# Patient Record
Sex: Female | Born: 1965 | Race: Black or African American | Hispanic: No | Marital: Married | Smoking: Never smoker
Health system: Southern US, Community
[De-identification: ages and names within clinical notes are randomized; demographics above are authoritative.]

## PROBLEM LIST (undated history)

## (undated) DIAGNOSIS — J45909 Unspecified asthma, uncomplicated: Secondary | ICD-10-CM

## (undated) DIAGNOSIS — N189 Chronic kidney disease, unspecified: Secondary | ICD-10-CM

## (undated) DIAGNOSIS — K760 Fatty (change of) liver, not elsewhere classified: Secondary | ICD-10-CM

## (undated) DIAGNOSIS — E119 Type 2 diabetes mellitus without complications: Secondary | ICD-10-CM

## (undated) DIAGNOSIS — M329 Systemic lupus erythematosus, unspecified: Secondary | ICD-10-CM

## (undated) DIAGNOSIS — G8929 Other chronic pain: Secondary | ICD-10-CM

## (undated) DIAGNOSIS — IMO0002 Reserved for concepts with insufficient information to code with codable children: Secondary | ICD-10-CM

## (undated) DIAGNOSIS — M549 Dorsalgia, unspecified: Secondary | ICD-10-CM

## (undated) DIAGNOSIS — G43909 Migraine, unspecified, not intractable, without status migrainosus: Secondary | ICD-10-CM

## (undated) DIAGNOSIS — M797 Fibromyalgia: Secondary | ICD-10-CM

## (undated) DIAGNOSIS — K589 Irritable bowel syndrome without diarrhea: Secondary | ICD-10-CM

## (undated) DIAGNOSIS — G5600 Carpal tunnel syndrome, unspecified upper limb: Secondary | ICD-10-CM

## (undated) DIAGNOSIS — N3010 Interstitial cystitis (chronic) without hematuria: Secondary | ICD-10-CM

## (undated) DIAGNOSIS — I10 Essential (primary) hypertension: Secondary | ICD-10-CM

## (undated) DIAGNOSIS — G932 Benign intracranial hypertension: Secondary | ICD-10-CM

## (undated) HISTORY — DX: Benign intracranial hypertension: G93.2

## (undated) HISTORY — PX: CYSTOSCOPY: SUR368

## (undated) HISTORY — DX: Carpal tunnel syndrome, unspecified upper limb: G56.00

## (undated) HISTORY — DX: Fatty (change of) liver, not elsewhere classified: K76.0

## (undated) HISTORY — PX: APPENDECTOMY: SHX54

## (undated) HISTORY — DX: Type 2 diabetes mellitus without complications: E11.9

## (undated) HISTORY — PX: CHOLECYSTECTOMY: SHX55

## (undated) HISTORY — DX: Systemic lupus erythematosus, unspecified: M32.9

## (undated) HISTORY — PX: ABDOMINAL HYSTERECTOMY: SHX81

## (undated) HISTORY — DX: Chronic kidney disease, unspecified: N18.9

---

## 2004-08-23 ENCOUNTER — Ambulatory Visit: Payer: Self-pay | Admitting: Pulmonary Disease

## 2006-08-14 ENCOUNTER — Ambulatory Visit (HOSPITAL_BASED_OUTPATIENT_CLINIC_OR_DEPARTMENT_OTHER): Admission: RE | Admit: 2006-08-14 | Discharge: 2006-08-14 | Payer: Self-pay | Admitting: General Surgery

## 2006-08-14 ENCOUNTER — Encounter (INDEPENDENT_AMBULATORY_CARE_PROVIDER_SITE_OTHER): Payer: Self-pay | Admitting: *Deleted

## 2006-09-02 ENCOUNTER — Encounter: Admission: RE | Admit: 2006-09-02 | Discharge: 2006-09-02 | Payer: Self-pay | Admitting: Rheumatology

## 2006-09-10 ENCOUNTER — Ambulatory Visit (HOSPITAL_COMMUNITY): Admission: RE | Admit: 2006-09-10 | Discharge: 2006-09-10 | Payer: Self-pay | Admitting: Specialist

## 2006-12-12 ENCOUNTER — Ambulatory Visit: Payer: Self-pay | Admitting: Internal Medicine

## 2006-12-22 ENCOUNTER — Ambulatory Visit: Payer: Self-pay | Admitting: Internal Medicine

## 2006-12-23 ENCOUNTER — Ambulatory Visit: Payer: Self-pay | Admitting: Internal Medicine

## 2007-07-03 ENCOUNTER — Ambulatory Visit: Payer: Self-pay | Admitting: Hematology and Oncology

## 2007-07-06 ENCOUNTER — Encounter: Admission: RE | Admit: 2007-07-06 | Discharge: 2007-07-06 | Payer: Self-pay | Admitting: Rheumatology

## 2007-07-10 LAB — SPEP & IFE WITH QIG
Albumin ELP: 44.6 % — ABNORMAL LOW (ref 55.8–66.1)
Alpha-2-Globulin: 12.6 % — ABNORMAL HIGH (ref 7.1–11.8)
Beta 2: 6.4 % (ref 3.2–6.5)
Beta Globulin: 6.3 % (ref 4.7–7.2)
IgA: 372 mg/dL (ref 68–378)

## 2007-07-10 LAB — COMPREHENSIVE METABOLIC PANEL
AST: 36 U/L (ref 0–37)
Albumin: 3.6 g/dL (ref 3.5–5.2)
BUN: 10 mg/dL (ref 6–23)
Calcium: 9 mg/dL (ref 8.4–10.5)
Chloride: 100 mEq/L (ref 96–112)
Glucose, Bld: 78 mg/dL (ref 70–99)
Potassium: 3.6 mEq/L (ref 3.5–5.3)

## 2007-07-10 LAB — KAPPA/LAMBDA LIGHT CHAINS: Kappa:Lambda Ratio: 1.07 (ref 0.26–1.65)

## 2007-07-14 LAB — UIFE/LIGHT CHAINS/TP QN, 24-HR UR
Free Kappa/Lambda Ratio: 6.55 ratio — ABNORMAL HIGH (ref 0.46–4.00)
Free Lambda Excretion/Day: 3 mg/d
Free Lambda Lt Chains,Ur: 0.2 mg/dL (ref 0.08–1.01)
Free Lt Chn Excr Rate: 19.65 mg/d
Time: 24 hours
Total Protein, Urine-Ur/day: 27 mg/d (ref 10–140)
Total Protein, Urine: 1.8 mg/dL
Volume, Urine: 1500 mL

## 2007-07-15 ENCOUNTER — Ambulatory Visit (HOSPITAL_COMMUNITY): Admission: RE | Admit: 2007-07-15 | Discharge: 2007-07-15 | Payer: Self-pay | Admitting: Hematology and Oncology

## 2007-07-29 LAB — CBC WITH DIFFERENTIAL/PLATELET
Basophils Absolute: 0 10*3/uL (ref 0.0–0.1)
EOS%: 1.7 % (ref 0.0–7.0)
Eosinophils Absolute: 0.2 10*3/uL (ref 0.0–0.5)
HGB: 11.8 g/dL (ref 11.6–15.9)
NEUT#: 5.8 10*3/uL (ref 1.5–6.5)
RDW: 15.4 % — ABNORMAL HIGH (ref 11.3–14.5)
lymph#: 2.5 10*3/uL (ref 0.9–3.3)

## 2007-09-13 ENCOUNTER — Encounter: Admission: RE | Admit: 2007-09-13 | Discharge: 2007-09-13 | Payer: Self-pay | Admitting: Rheumatology

## 2007-10-15 ENCOUNTER — Encounter: Admission: RE | Admit: 2007-10-15 | Discharge: 2007-10-15 | Payer: Self-pay | Admitting: Gastroenterology

## 2007-10-21 DIAGNOSIS — J309 Allergic rhinitis, unspecified: Secondary | ICD-10-CM | POA: Insufficient documentation

## 2007-10-21 DIAGNOSIS — I2510 Atherosclerotic heart disease of native coronary artery without angina pectoris: Secondary | ICD-10-CM | POA: Insufficient documentation

## 2007-10-22 ENCOUNTER — Ambulatory Visit: Payer: Self-pay | Admitting: Internal Medicine

## 2007-10-22 DIAGNOSIS — R0602 Shortness of breath: Secondary | ICD-10-CM | POA: Insufficient documentation

## 2007-10-27 ENCOUNTER — Ambulatory Visit: Payer: Self-pay | Admitting: Internal Medicine

## 2007-10-28 ENCOUNTER — Telehealth (INDEPENDENT_AMBULATORY_CARE_PROVIDER_SITE_OTHER): Payer: Self-pay | Admitting: *Deleted

## 2007-10-29 ENCOUNTER — Ambulatory Visit: Payer: Self-pay | Admitting: Pulmonary Disease

## 2007-11-09 ENCOUNTER — Encounter: Payer: Self-pay | Admitting: Pulmonary Disease

## 2007-11-09 ENCOUNTER — Ambulatory Visit: Payer: Self-pay

## 2007-11-11 ENCOUNTER — Ambulatory Visit (HOSPITAL_COMMUNITY): Admission: RE | Admit: 2007-11-11 | Discharge: 2007-11-11 | Payer: Self-pay | Admitting: Internal Medicine

## 2007-11-11 ENCOUNTER — Encounter: Payer: Self-pay | Admitting: Internal Medicine

## 2007-11-13 ENCOUNTER — Ambulatory Visit: Payer: Self-pay | Admitting: Internal Medicine

## 2007-11-13 ENCOUNTER — Encounter: Payer: Self-pay | Admitting: Internal Medicine

## 2007-11-16 ENCOUNTER — Encounter (INDEPENDENT_AMBULATORY_CARE_PROVIDER_SITE_OTHER): Payer: Self-pay | Admitting: *Deleted

## 2007-11-25 ENCOUNTER — Encounter: Payer: Self-pay | Admitting: Internal Medicine

## 2007-11-26 ENCOUNTER — Encounter: Payer: Self-pay | Admitting: Internal Medicine

## 2007-12-03 ENCOUNTER — Encounter (INDEPENDENT_AMBULATORY_CARE_PROVIDER_SITE_OTHER): Payer: Self-pay | Admitting: *Deleted

## 2007-12-09 ENCOUNTER — Encounter: Payer: Self-pay | Admitting: Internal Medicine

## 2007-12-09 ENCOUNTER — Ambulatory Visit (HOSPITAL_COMMUNITY): Admission: RE | Admit: 2007-12-09 | Discharge: 2007-12-09 | Payer: Self-pay | Admitting: Internal Medicine

## 2008-01-18 ENCOUNTER — Ambulatory Visit: Payer: Self-pay | Admitting: Internal Medicine

## 2008-02-05 ENCOUNTER — Ambulatory Visit: Payer: Self-pay | Admitting: Internal Medicine

## 2009-08-21 IMAGING — CT CT PARANASAL SINUSES LIMITED
2 of 6 series · 15 of 36 positions shown, 18 images · non-contrast
Comparison: CT abdomen pelvis 10/15/2007.
COMPARISON: None.

CLINICAL DATA: 41-year-old female with shortness of breath and
right posterior chest pain times 2 months.  Chronic headaches.
Evaluate pulmonary vasculitis or sinusitis.

CHEST CT WITHOUT CONTRAST
TECHNIQUE: Multidetector CT imaging of the chest was performed
following the standard protocol without intravenous contrast.
following the standard protocol without IV contrast.,Technique:
Multidetector CT through the paranasal sinuses was performed using
the standard protocol without intravenous contrast.

[Series 8: chest_hi_res 5.0 b40f st · axial · 0.74mm/px · z∈[+1184,+1404]mm · 12 of 52 slices shown, 15 images]
[im 4/52  mediastinal]
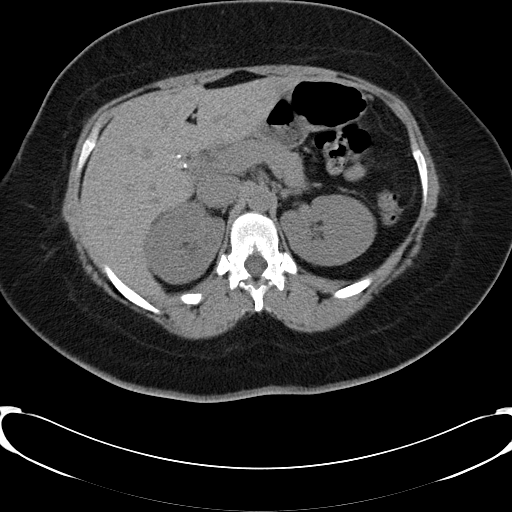
[im 4/52  bone]
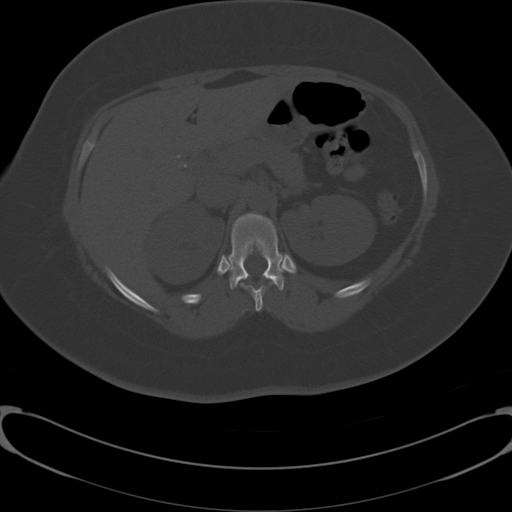
[im 8/52  bone]
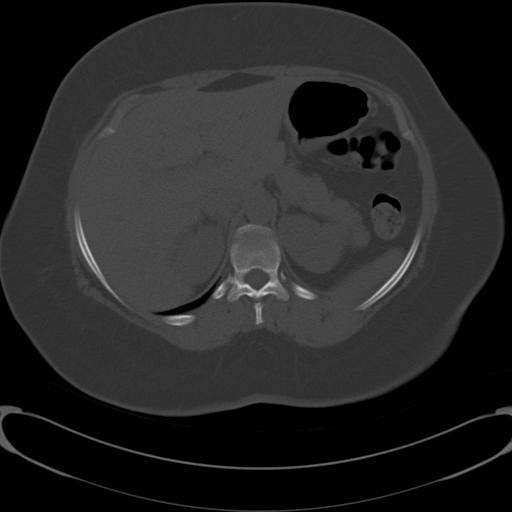
[im 12/52  bone]
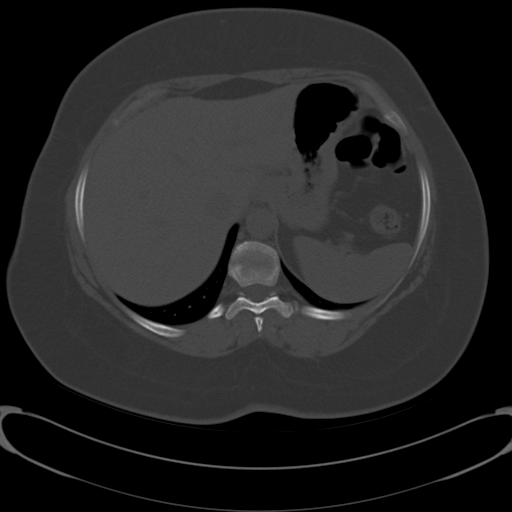
[im 16/52  bone]
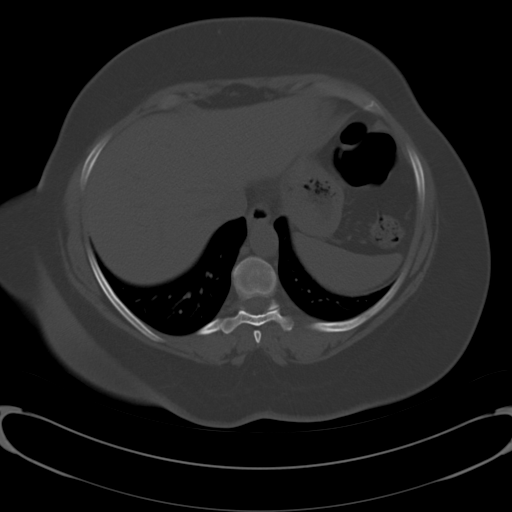
[im 20/52  mediastinal]
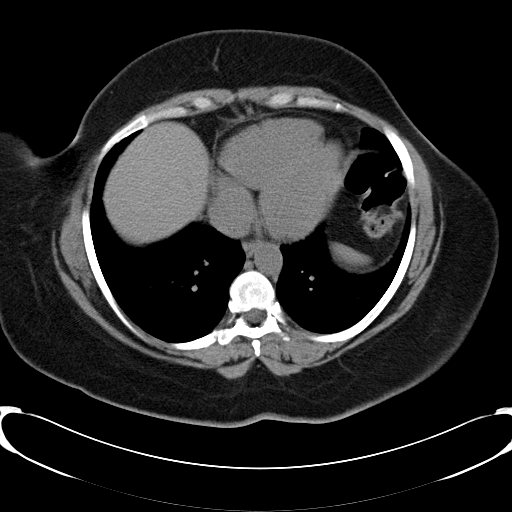
[im 20/52  bone]
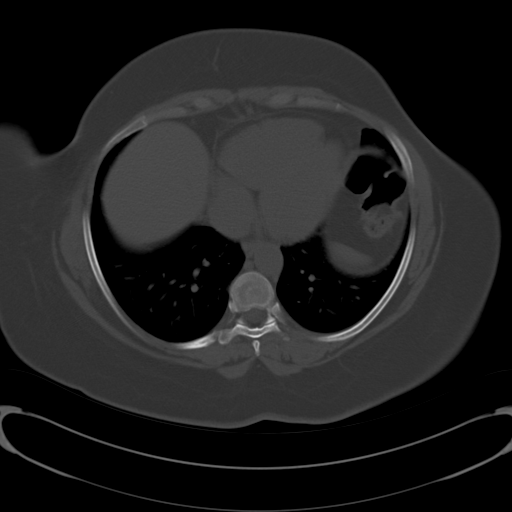
[im 24/52  bone]
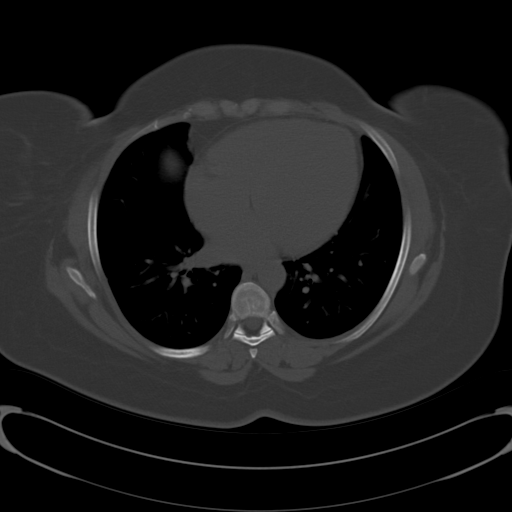
[im 28/52  bone]
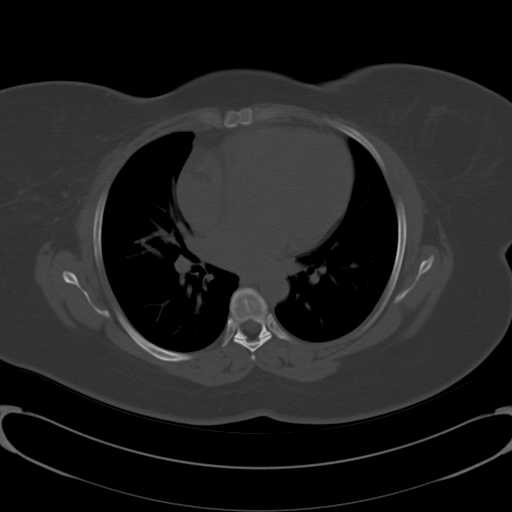
[im 32/52  bone]
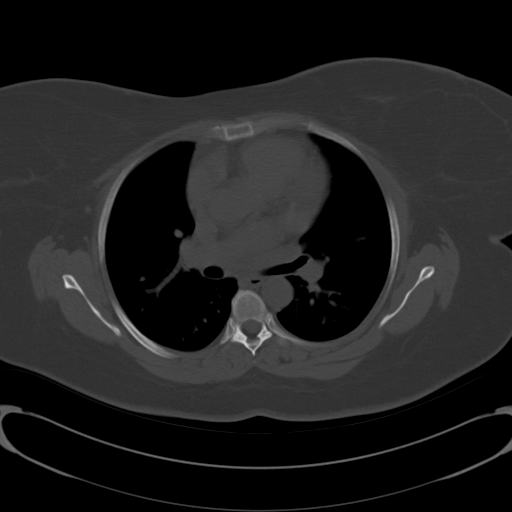
[im 36/52  mediastinal]
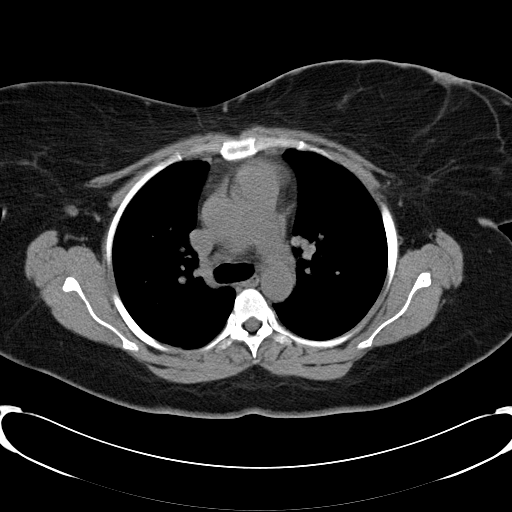
[im 36/52  bone]
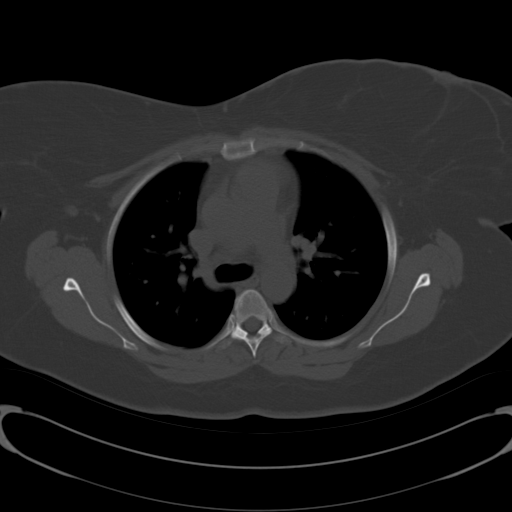
[im 40/52  bone]
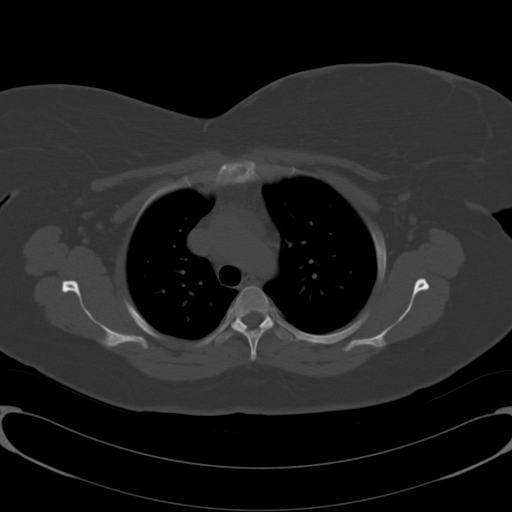
[im 44/52  bone]
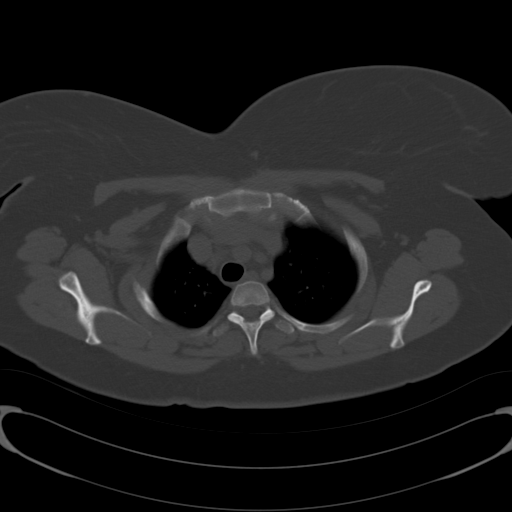
[im 48/52  bone]
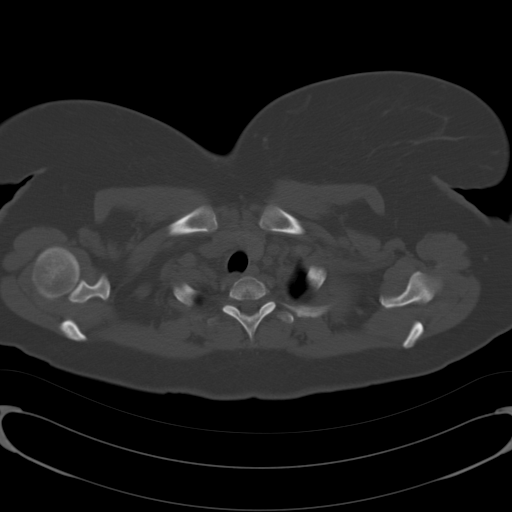

[Series 603: <mpr thick range> · coronal · 0.74mm/px · 3 of 84 slices shown]
[im 17/84  bone]
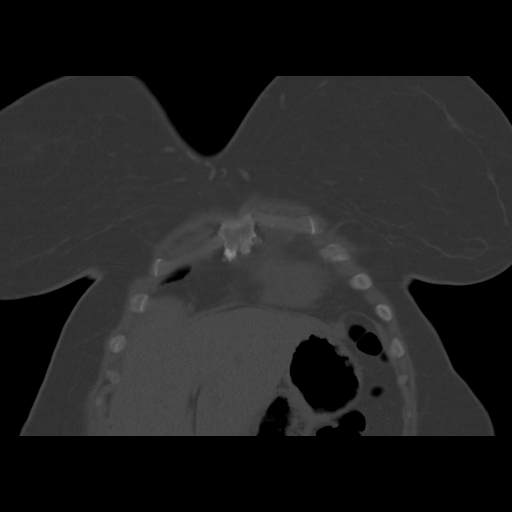
[im 34/84  bone]
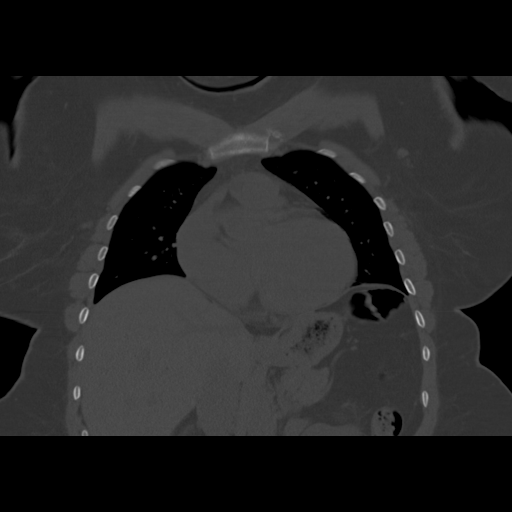
[im 50/84  bone]
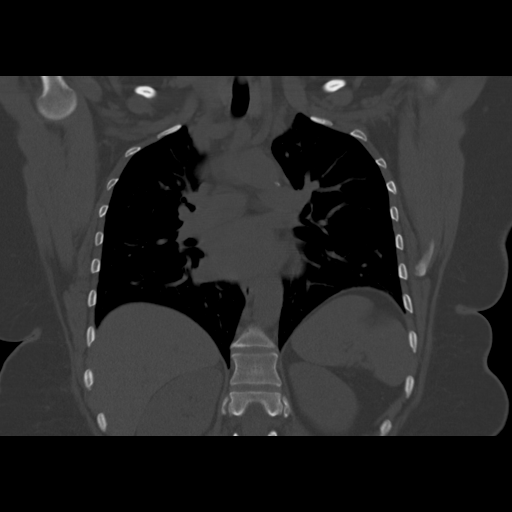

[15 of 36 positions shown; findings below may reference images not displayed]

FINDINGS: The major airways are patent.  There is no pleural
effusion.  The lungs are clear.  Dedicated high resolution images
with high spatial resolution algorithm reveal no septal thickening,
ground-glass opacity or pulmonary nodules.

No osseous abnormality.  Noncontrasted upper abdominal viscera
remarkable for surgical absence of the gallbladder.  No pericardial
effusion.  No mediastinal lymphadenopathy.  Thoracic inlet within
normal limits.  No axillary lymphadenopathy.  No focal inflammatory
changes identified.
IMPRESSION: Normal noncontrast chest CT.  Negative high resolution images of
the lungs.

CT PARANASAL SINUSES WITHOUT CONTRAST
FINDINGS: Visualized scalp, face and orbit soft tissues are within
normal limits.  The paranasal sinuses are clear aside from a small
area of opacification in the right anterior ethmoid air cells.  No
mucosal thickening.  No acute osseous abnormality.  Visualized
brain parenchyma is grossly normal.
IMPRESSION: No paranasal sinus disease.

## 2010-06-10 ENCOUNTER — Encounter: Payer: Self-pay | Admitting: Gastroenterology

## 2010-10-02 NOTE — Assessment & Plan Note (Signed)
[facility]                             PULMONARY OFFICE NOTE   PROMISS, LABARBERA                      MRN:          [id number]  DATE:12/23/2006                            DOB:          1965-09-22    PULMONARY EXTENDED FOLLOW-UP OFFICE VISIT:   HISTORY:  A 45 year old black female with paroxysms of dyspnea that last  from minutes to days and actually spent all last week very tired and  short of breath but spontaneously improved.  Previously, she said she  has improved after a course of prednisone but did not do anything  different during the week to treat the problem nor did she contact the  office for evaluation.  She comes back today as requested for PFTs,  having undergone a workup for occult collagen vascular disease and  having been found by Dr. Corliss Skains to have a positive ANA study done on  October 27, 2006.  Her anti-double-stranded DNA was negative, however.  Dr.  Corliss Skains also found that she had a positive P-ANCA and a sed rate of 48  documented also on July 31, 2006.   Today she says she is feeling much better.  She denies any exertional  chest pain, orthopnea, PND, leg swelling, or significant cough.   PHYSICAL EXAMINATION:  She is a pleasant, ambulatory black female in no  acute distress.  She is afebrile with normal vital signs.  HEENT:  Unremarkable.  Her oropharynx is clear.  LUNGS:  Perfectly clear bilaterally to auscultation and percussion with  no cough elicited on inspiratory/expiratory maneuvers.  HEART:  Regular rhythm without murmur, rub or gallop.  ABDOMEN:  Soft, benign.  EXTREMITIES:  Warm without calf tenderness, clubbing, cyanosis or edema.   PFTs were performed today and are normal.   Chest x-ray was reported to be normal in April and is repeated today and  is pending.   IMPRESSION:  No evidence of active pulmonary involvement with collagen  vascular disease.  It is very unlikely that she would have any form of  parenchymal or airway disease that would spontaneously remit and then  turn around and exacerbate on the same maintenance regimen without a  more specific pattern (for instance, associated with cough or subjective  wheezing or stabilization and/or improvement on Advair).   I also could not make any sense out of her medical regimen, which is  quite complicated and not user-friendly, in terms of her organization of  her medicines.   RECOMMENDATIONS:  1. If she is going to take Nexium at all, she should take it 40 mg 30-      60 minutes before eating perfectly regularly to see if  It has any effect on her spells, which very well could all be reflux.  1. At this point, she should stop Advair because she says it makes no      difference whether she takes the Advair or not in terms of having      these spells.  2. When she returns in two weeks, I would like for her to present all  of her medicines in two separate bags so we can get a full and      accurate inventory of her medicines (incidental note made today of      the fact that she does apparently take Macrobid, which she did not      disclose previously and could become very relevant if symptoms      remain unexplained).  3. 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      Probably a red herring.  If, on the other hand, she had a sed      rate elevation that correlated with symptoms, I would be more      convinced that there was an underlying collagen vascular disease      that needed to be treated but would be still confused by the fact      that she spontaneously remits, in terms of symptoms, without any      specific change in maintenance or p.r.n. medicines.   Before we go any further with her workup, I would recommend full  medication reconciliation and institute also a GERD diet for her  empirically, assuming that some if not all of her pulmonary symptoms may  be GERD-related.     Charlaine Dalton. Sherene Sires, MD,  [facility]  Electronically Signed    MBW/MedQ  DD: 12/23/2006  DT: 12/23/2006  Job #: [id number]   cc:   Magnus Sinning. Rice, M.D.

## 2010-10-02 NOTE — Assessment & Plan Note (Signed)
[facility]                             PULMONARY OFFICE NOTE   CODEE, BLOODWORTH                      MRN:          [id number]  DATE:12/12/2006                            DOB:          [birth year]    PULMONARY CONSULTATION   REASON FOR CONSULTATION:  Cough and dyspnea.   HISTORY:  This is a 45 year old black female never-smoker presenting  with symptoms of cough, fatigue, and fever 4 to 5 years ago with a  chronic pneumonia with no obvious explanation.  She also had bilateral  anterior chest discomfort, worse with deep breathing that radiated to  her back.  No specific diagnosis was ever made, but apparently has  recently been diagnosed with fibromyalgia, and then as lupus by Dr.  Corliss Skains.  Her most recent chest x-ray dated September 02, 2006 shows no  pulmonary disease, and actually since that x-ray was made, the patient  has no symptoms at all.  She is not aerobically active in that fatigue  and her knees hold her back more than her breathing.  However, she  thinks she has improved on her present regimen.   The problem is that her present regimen is listed alphabetically and  consists of 2 pages of medications.  When I asked her if she took all of  the medicines, she says Well, some of them I take when I need and some  of them I take regularly, but she has no organization at all to them.   However, whatever she is doing presently, she says, is working great  (whatever that is).   PAST MEDICAL HISTORY:  Significant for hypertension, diabetes, and  remote hysterectomy, tubal ligation.   ALLERGIES:  IMITREX, CODEINE, and ALTACE.   MEDICATIONS:  Taken alphabetically from the patient, but the patient  uses her own organization as to whether she takes maintenance versus  p.r.n.  I could not actually tell what she is taking, when she is taking  it, and why she is taking it.   SOCIAL HISTORY:  She has never smoked.  She works as an  Recruitment consultant.   FAMILY HISTORY:  Positive for asthma in her grandmother and rheumatism  in both mother and grandmother.   REVIEW OF SYSTEMS:  Taken in detail on the worksheet and negative,  except as outlined above.   PHYSICAL EXAMINATION:  This is a pleasant, ambulatory, obese black  female in no acute distress.  VITAL SIGNS:  Stable vital signs.  HEENT:  Unremarkable.  Oropharynx is clear.  LUNGS:  The lung fields are completely clear bilaterally to auscultation  and percussion with excellent air movement.  No cough on inspiratory or  expiratory maneuvers.  CARDIAC:  Regular rate and rhythm without murmur, gallop, or rub.  ABDOMEN:  Soft and benign.  EXTREMITIES:  Warm without calf tenderness, cyanosis, clubbing, or  edema.   CHEST X-RAY:  Reviewed from September 02, 2006.  It is completely normal.  Hemoglobin saturation is 99%.   IMPRESSION:  Because this patient has no active symptoms, but was having  active complaints when her chest  x-ray was seen on April 15, I do not  believe any further pulmonary workup is needed.  There is, her  unexplained chest pain, dyspnea, and cough have all resolved on her  present regimen, whatever it is.   Therefore, I am going to ask her to continue to take exactly the  medicines she is taking and simply asked her to come back for full  medication reconciliation purposes where she separates her medicines  into 2 bags, those she perceives she should take perfectly regularly,  regardless of whether she feels good or not, and the second category are  those medicines she stops when she feels better.   Perhaps then I can get a better idea of how to help her adjust her  maintenance medicines downward and use her p.r.n. in a more logical  manner.   I see no evidence based on chest x-ray or history of active lupus  pneumonitis, which I believe was the issue Dr. Corliss Skains was raising,  but if there are other specific pulmonary issues, I  would be happy to  address them as they arise.     Charlaine Dalton. Sherene Sires, MD, [facility]  Electronically Signed    MBW/MedQ  DD: 12/12/2006  DT: 12/13/2006  Job #: [id number]   cc:   Kathryne Hitch, MD

## 2010-10-05 NOTE — Op Note (Signed)
Connie Arnold, [place]               ACCOUNT NO.:  [id number]   MEDICAL RECORD NO.:  [id number]          PATIENT TYPE:  AMB   LOCATION:  [facility]                          FACILITY:  [facility]   PHYSICIAN:  Anselm Pancoast. Weatherly, M.D.DATE OF BIRTH:  05-Jun-1965   DATE OF PROCEDURE:  DATE OF DISCHARGE:                               OPERATIVE REPORT   PREOPERATIVE DIAGNOSIS:  Left sided headache, rule out temporal  arteritis.   POSTOPERATIVE DIAGNOSIS:  Left sided headache, rule out temporal  arteritis.   OPERATION:  Left temporal artery biopsy.   ANESTHESIA:  Local anesthesia.   SURGEON:  Anselm Pancoast. Zachery Dakins, M.D.   HISTORY:  Teala Daffron is a 45 year old female referred to me by Dr.  Neale Burly, headache clinic, for rule out temporal arteritis.  She has a  mildly elevated SED rate, has a history of fibromyalgia and other  symptoms and is on several medications.  I discussed with her about the  artery with a biopsy and not management of the headache and she  understands the planned procedure.  Preoperatively, she asked that we  use general anesthesia but we elected to give her some p.o. Valium to  relax her and did it with local anesthesia.   She was positioned on the OR table, the patient was identified.  I used  the Doppler to kind of be able to feel and hear the artery nicely and  then shaved a small amount of the temporal hair area on the left.  I  then prepped the area with Betadine solution and draped her in a sterile  manner.  A 1% plain Xylocaine was used to infiltrate over the proximal  portion and then the small incision was made.  The artery was  immediately under the area and you could see it pulsating and there was  a vein lying with it.  I then anesthetized the area a little more  distally so I could get a good 1-inch segment of the vessel and then  used a 4-0 Vicryl to tie off the proximal area of the Vicryl.  I used  the hemostat, divided it and got about a 1-inch segment and  then tied  the opposite end with a 4-0 Vicryl.  There was a little bleeding from  the vein that I sutured with a 4-0 Vicryl and then the subcutaneous  tissue was closed with one stitch of 4-0 Vicryl and then interrupted 4-0  nylon used for the skin.  The patient tolerated the procedure nicely,  and will be released after a short stay and we will see her back in the  office in approximately a week.  We will have the path report possibly  tomorrow, probably Monday and we will notify her.  The artery itself  looked normal but we will let the pathologist rule out the temporal  arteritis.           ______________________________  Anselm Pancoast. Zachery Dakins, M.D.     WJW/MEDQ  D:  08/14/2006  T:  08/14/2006  Job:  [id number]

## 2011-05-06 DIAGNOSIS — M329 Systemic lupus erythematosus, unspecified: Secondary | ICD-10-CM | POA: Insufficient documentation

## 2011-05-06 DIAGNOSIS — Z79899 Other long term (current) drug therapy: Secondary | ICD-10-CM | POA: Insufficient documentation

## 2011-05-06 DIAGNOSIS — M797 Fibromyalgia: Secondary | ICD-10-CM | POA: Insufficient documentation

## 2011-11-16 DIAGNOSIS — N3010 Interstitial cystitis (chronic) without hematuria: Secondary | ICD-10-CM | POA: Insufficient documentation

## 2011-11-16 DIAGNOSIS — N390 Urinary tract infection, site not specified: Secondary | ICD-10-CM | POA: Insufficient documentation

## 2013-04-29 DIAGNOSIS — R809 Proteinuria, unspecified: Secondary | ICD-10-CM | POA: Insufficient documentation

## 2013-04-29 DIAGNOSIS — R319 Hematuria, unspecified: Secondary | ICD-10-CM | POA: Insufficient documentation

## 2013-08-20 ENCOUNTER — Emergency Department (HOSPITAL_BASED_OUTPATIENT_CLINIC_OR_DEPARTMENT_OTHER)
Admission: EM | Admit: 2013-08-20 | Discharge: 2013-08-20 | Disposition: A | Discharge status: Home / Self Care | Payer: Medicare Other | Attending: Emergency Medicine | Admitting: Emergency Medicine

## 2013-08-20 ENCOUNTER — Encounter (HOSPITAL_BASED_OUTPATIENT_CLINIC_OR_DEPARTMENT_OTHER): Payer: Self-pay | Admitting: Emergency Medicine

## 2013-08-20 DIAGNOSIS — Z8719 Personal history of other diseases of the digestive system: Secondary | ICD-10-CM | POA: Insufficient documentation

## 2013-08-20 DIAGNOSIS — J45909 Unspecified asthma, uncomplicated: Secondary | ICD-10-CM | POA: Insufficient documentation

## 2013-08-20 DIAGNOSIS — Z8742 Personal history of other diseases of the female genital tract: Secondary | ICD-10-CM | POA: Insufficient documentation

## 2013-08-20 DIAGNOSIS — I10 Essential (primary) hypertension: Secondary | ICD-10-CM | POA: Insufficient documentation

## 2013-08-20 DIAGNOSIS — J40 Bronchitis, not specified as acute or chronic: Secondary | ICD-10-CM

## 2013-08-20 DIAGNOSIS — J209 Acute bronchitis, unspecified: Secondary | ICD-10-CM | POA: Insufficient documentation

## 2013-08-20 DIAGNOSIS — Z79899 Other long term (current) drug therapy: Secondary | ICD-10-CM | POA: Insufficient documentation

## 2013-08-20 DIAGNOSIS — M329 Systemic lupus erythematosus, unspecified: Secondary | ICD-10-CM | POA: Insufficient documentation

## 2013-08-20 DIAGNOSIS — G43909 Migraine, unspecified, not intractable, without status migrainosus: Secondary | ICD-10-CM | POA: Insufficient documentation

## 2013-08-20 HISTORY — DX: Irritable bowel syndrome, unspecified: K58.9

## 2013-08-20 HISTORY — DX: Reserved for concepts with insufficient information to code with codable children: IMO0002

## 2013-08-20 HISTORY — DX: Essential (primary) hypertension: I10

## 2013-08-20 HISTORY — DX: Unspecified asthma, uncomplicated: J45.909

## 2013-08-20 HISTORY — DX: Systemic lupus erythematosus, unspecified: M32.9

## 2013-08-20 HISTORY — DX: Fibromyalgia: M79.7

## 2013-08-20 HISTORY — DX: Interstitial cystitis (chronic) without hematuria: N30.10

## 2013-08-20 HISTORY — DX: Migraine, unspecified, not intractable, without status migrainosus: G43.909

## 2013-08-20 MED ORDER — PREDNISONE 10 MG PO TABS: ORAL_TABLET | ORAL | Status: DC

## 2013-08-20 MED ORDER — AZITHROMYCIN 250 MG PO TABS: 250.0000 mg | ORAL_TABLET | Freq: Every day | ORAL | Status: DC

## 2013-08-20 NOTE — Discharge Instructions (Signed)

## 2013-08-20 NOTE — ED Notes (Signed)
Scratch throat and URI symptoms since yesterday.

## 2013-08-20 NOTE — ED Provider Notes (Signed)
CSN: [id number]     Arrival date & time 08/20/13  1214 History   None    Chief Complaint  Patient presents with  . URI     (Consider location/radiation/quality/duration/timing/severity/associated sxs/prior Treatment) Patient is a 48 y.o. female presenting with URI. The history is provided by the patient. No language interpreter was used.  URI Presenting symptoms: congestion and cough   Severity:  Moderate Timing:  Constant Progression:  Worsening Chronicity:  New Relieved by:  Nothing Worsened by:  Nothing tried Ineffective treatments:  None tried Risk factors comment:  Lupus  Pt has asthma and lupus.  Pt coughing and congested. Pt using inhaler.   Past Medical History  Diagnosis Date  . Asthma   . Lupus   . Fibromyalgia   . Migraines   . Hypertension   . IBS (irritable bowel syndrome)   . Interstitial cystitis    Past Surgical History  Procedure Laterality Date  . Abdominal hysterectomy    . Cholecystectomy    . Appendectomy     No family history on file. History  Substance Use Topics  . Smoking status: Never Smoker   . Smokeless tobacco: Not on file  . Alcohol Use: No   OB History   Grav Para Term Preterm Abortions TAB SAB Ect Mult Living                 Review of Systems  HENT: Positive for congestion.   Respiratory: Positive for cough.   All other systems reviewed and are negative.      Allergies  Codeine; Imitrex; and Ramipril  Home Medications   Current Outpatient Rx  Name  Route  Sig  Dispense  Refill  . diltiazem (MATZIM LA) 180 MG 24 hr tablet   Oral   Take 180 mg by mouth daily.         . hydroxychloroquine (PLAQUENIL) 200 MG tablet   Oral   Take by mouth daily.         Marland Kitchen. METFORMIN HCL PO   Oral   Take by mouth.         . Montelukast Sodium (SINGULAIR PO)   Oral   Take by mouth.         . NABUMETONE PO   Oral   Take by mouth.         . Topiramate (TOPAMAX PO)   Oral   Take by mouth.          BP 126/79   Pulse 75  Temp(Src) 98 F (36.7 C) (Oral)  Resp 16  Ht 5\' 5"  (1.651 m)  Wt 210 lb (95.255 kg)  BMI 34.95 kg/m2  SpO2 97% Physical Exam  Nursing note and vitals reviewed. Constitutional: She appears well-developed and well-nourished.  HENT:  Head: Normocephalic and atraumatic.  Right Ear: External ear normal.  Left Ear: External ear normal.  Mouth/Throat: Oropharynx is clear and moist.  Eyes: Conjunctivae and EOM are normal. Pupils are equal, round, and reactive to light.  Neck: Normal range of motion.  Cardiovascular: Normal rate and normal heart sounds.   Pulmonary/Chest: Effort normal and breath sounds normal.  Abdominal: Soft.  Musculoskeletal: Normal range of motion.  Neurological: She is alert.  Skin: Skin is warm.  Psychiatric: She has a normal mood and affect.    ED Course  Procedures (including critical care time) Labs Review Labs Reviewed - No data to display Imaging Review No results found.   EKG Interpretation None  MDM   Final diagnoses:  Bronchitis    zithromax and prednisone.      Lonia Skinner [place], PA-C 08/20/13 1440  Lonia Skinner [place], [place] 08/20/13 1440

## 2013-08-23 NOTE — ED Provider Notes (Signed)
History/physical exam/procedure(s) were performed by non-physician practitioner and as supervising physician I was immediately available for consultation/collaboration. I have reviewed all notes and am in agreement with care and plan.   Hilario Quarry, MD 08/23/13 1739

## 2013-11-01 DIAGNOSIS — F332 Major depressive disorder, recurrent severe without psychotic features: Secondary | ICD-10-CM | POA: Insufficient documentation

## 2014-01-05 ENCOUNTER — Emergency Department (HOSPITAL_BASED_OUTPATIENT_CLINIC_OR_DEPARTMENT_OTHER)
Admission: EM | Admit: 2014-01-05 | Discharge: 2014-01-05 | Disposition: A | Discharge status: Home / Self Care | Payer: Medicare Other | Attending: Emergency Medicine | Admitting: Emergency Medicine

## 2014-01-05 ENCOUNTER — Encounter (HOSPITAL_BASED_OUTPATIENT_CLINIC_OR_DEPARTMENT_OTHER): Payer: Self-pay | Admitting: Emergency Medicine

## 2014-01-05 ENCOUNTER — Emergency Department (HOSPITAL_BASED_OUTPATIENT_CLINIC_OR_DEPARTMENT_OTHER): Payer: Medicare Other

## 2014-01-05 DIAGNOSIS — Z87448 Personal history of other diseases of urinary system: Secondary | ICD-10-CM | POA: Diagnosis not present

## 2014-01-05 DIAGNOSIS — J45909 Unspecified asthma, uncomplicated: Secondary | ICD-10-CM | POA: Diagnosis not present

## 2014-01-05 DIAGNOSIS — M329 Systemic lupus erythematosus, unspecified: Secondary | ICD-10-CM | POA: Insufficient documentation

## 2014-01-05 DIAGNOSIS — I10 Essential (primary) hypertension: Secondary | ICD-10-CM | POA: Insufficient documentation

## 2014-01-05 DIAGNOSIS — G43909 Migraine, unspecified, not intractable, without status migrainosus: Secondary | ICD-10-CM | POA: Diagnosis not present

## 2014-01-05 DIAGNOSIS — S0990XA Unspecified injury of head, initial encounter: Secondary | ICD-10-CM | POA: Insufficient documentation

## 2014-01-05 DIAGNOSIS — Z792 Long term (current) use of antibiotics: Secondary | ICD-10-CM | POA: Diagnosis not present

## 2014-01-05 DIAGNOSIS — Z8719 Personal history of other diseases of the digestive system: Secondary | ICD-10-CM | POA: Insufficient documentation

## 2014-01-05 DIAGNOSIS — R519 Headache, unspecified: Secondary | ICD-10-CM

## 2014-01-05 DIAGNOSIS — R51 Headache: Secondary | ICD-10-CM

## 2014-01-05 DIAGNOSIS — IMO0002 Reserved for concepts with insufficient information to code with codable children: Secondary | ICD-10-CM | POA: Diagnosis not present

## 2014-01-05 MED ORDER — TOPIRAMATE 200 MG PO TABS: 200.0000 mg | ORAL_TABLET | Freq: Two times a day (BID) | ORAL | Status: DC

## 2014-01-05 MED ORDER — HYDROCODONE-ACETAMINOPHEN 5-325 MG PO TABS: 1.0000 | ORAL_TABLET | Freq: Four times a day (QID) | ORAL | Status: DC | PRN

## 2014-01-05 MED ORDER — HYDROXYCHLOROQUINE SULFATE 200 MG PO TABS: 200.0000 mg | ORAL_TABLET | Freq: Two times a day (BID) | ORAL | Status: AC

## 2014-01-05 NOTE — ED Notes (Signed)
Family at bs, pt in NAD.
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
## 2014-01-05 NOTE — Discharge Instructions (Signed)
Return here as needed Headaches, Frequently Asked Questions MIGRAINE HEADACHES Q: What is migraine? What causes it? How can I treat it? A: Generally, migraine headaches begin as a dull ache. Then they develop into a constant, throbbing, and pulsating pain. You may experience pain at the temples. You may experience pain at the front or back of one or both sides of the head. The pain is usually accompanied by a combination of:  Nausea.  Vomiting.  Sensitivity to light and noise. Some people (about 15%) experience an aura (see below) before an attack. The cause of migraine is believed to be chemical reactions in the brain. Treatment for migraine may include over-the-counter or prescription medications. It may also include self-help techniques. These include relaxation training and biofeedback.  Q: What is an aura? A: About 15% of people with migraine get an "aura". This is a sign of neurological symptoms that occur before a migraine headache. You may see wavy or jagged lines, dots, or flashing lights. You might experience tunnel vision or blind spots in one or both eyes. The aura can include visual or auditory hallucinations (something imagined). It may include disruptions in smell (such as strange odors), taste or touch. Other symptoms include:  Numbness.  A "pins and needles" sensation.  Difficulty in recalling or speaking the correct word. These neurological events may last as long as 60 minutes. These symptoms will fade as the headache begins. Q: What is a trigger? A: Certain physical or environmental factors can lead to or "trigger" a migraine. These include:  Foods.  Hormonal changes.  Weather.  Stress. It is important to remember that triggers are different for everyone. To help prevent migraine attacks, you need to figure out which triggers affect you. Keep a headache diary. This is a good way to track triggers. The diary will help you talk to your healthcare professional about  your condition. Q: Does weather affect migraines? A: Bright sunshine, hot, humid conditions, and drastic changes in barometric pressure may lead to, or "trigger," a migraine attack in some people. But studies have shown that weather does not act as a trigger for everyone with migraines. Q: What is the link between migraine and hormones? A: Hormones start and regulate many of your body's functions. Hormones keep your body in balance within a constantly changing environment. The levels of hormones in your body are unbalanced at times. Examples are during menstruation, pregnancy, or menopause. That can lead to a migraine attack. In fact, about three quarters of all women with migraine report that their attacks are related to the menstrual cycle.  Q: Is there an increased risk of stroke for migraine sufferers? A: The likelihood of a migraine attack causing a stroke is very remote. That is not to say that migraine sufferers cannot have a stroke associated with their migraines. In persons under age 61, the most common associated factor for stroke is migraine headache. But over the course of a person's normal life span, the occurrence of migraine headache may actually be associated with a reduced risk of dying from cerebrovascular disease due to stroke.  Q: What are acute medications for migraine? A: Acute medications are used to treat the pain of the headache after it has started. Examples over-the-counter medications, NSAIDs, ergots, and triptans.  Q: What are the triptans? A: Triptans are the newest class of abortive medications. They are specifically targeted to treat migraine. Triptans are vasoconstrictors. They moderate some chemical reactions in the brain. The triptans work on receptors in  your brain. Triptans help to restore the balance of a neurotransmitter called serotonin. Fluctuations in levels of serotonin are thought to be a main cause of migraine.  Q: Are over-the-counter medications for migraine  effective? A: Over-the-counter, or "OTC," medications may be effective in relieving mild to moderate pain and associated symptoms of migraine. But you should see your caregiver before beginning any treatment regimen for migraine.  Q: What are preventive medications for migraine? A: Preventive medications for migraine are sometimes referred to as "prophylactic" treatments. They are used to reduce the frequency, severity, and length of migraine attacks. Examples of preventive medications include antiepileptic medications, antidepressants, beta-blockers, calcium channel blockers, and NSAIDs (nonsteroidal anti-inflammatory drugs). Q: Why are anticonvulsants used to treat migraine? A: During the past few years, there has been an increased interest in antiepileptic drugs for the prevention of migraine. They are sometimes referred to as "anticonvulsants". Both epilepsy and migraine may be caused by similar reactions in the brain.  Q: Why are antidepressants used to treat migraine? A: Antidepressants are typically used to treat people with depression. They may reduce migraine frequency by regulating chemical levels, such as serotonin, in the brain.  Q: What alternative therapies are used to treat migraine? A: The term "alternative therapies" is often used to describe treatments considered outside the scope of conventional Western medicine. Examples of alternative therapy include acupuncture, acupressure, and yoga. Another common alternative treatment is herbal therapy. Some herbs are believed to relieve headache pain. Always discuss alternative therapies with your caregiver before proceeding. Some herbal products contain arsenic and other toxins. TENSION HEADACHES Q: What is a tension-type headache? What causes it? How can I treat it? A: Tension-type headaches occur randomly. They are often the result of temporary stress, anxiety, fatigue, or anger. Symptoms include soreness in your temples, a tightening  band-like sensation around your head (a "vice-like" ache). Symptoms can also include a pulling feeling, pressure sensations, and contracting head and neck muscles. The headache begins in your forehead, temples, or the back of your head and neck. Treatment for tension-type headache may include over-the-counter or prescription medications. Treatment may also include self-help techniques such as relaxation training and biofeedback. CLUSTER HEADACHES Q: What is a cluster headache? What causes it? How can I treat it? A: Cluster headache gets its name because the attacks come in groups. The pain arrives with little, if any, warning. It is usually on one side of the head. A tearing or bloodshot eye and a runny nose on the same side of the headache may also accompany the pain. Cluster headaches are believed to be caused by chemical reactions in the brain. They have been described as the most severe and intense of any headache type. Treatment for cluster headache includes prescription medication and oxygen. SINUS HEADACHES Q: What is a sinus headache? What causes it? How can I treat it? A: When a cavity in the bones of the face and skull (a sinus) becomes inflamed, the inflammation will cause localized pain. This condition is usually the result of an allergic reaction, a tumor, or an infection. If your headache is caused by a sinus blockage, such as an infection, you will probably have a fever. An x-ray will confirm a sinus blockage. Your caregiver's treatment might include antibiotics for the infection, as well as antihistamines or decongestants.  REBOUND HEADACHES Q: What is a rebound headache? What causes it? How can I treat it? A: A pattern of taking acute headache medications too often can lead to a condition  known as "rebound headache." A pattern of taking too much headache medication includes taking it more than 2 days per week or in excessive amounts. That means more than the label or a caregiver advises.  With rebound headaches, your medications not only stop relieving pain, they actually begin to cause headaches. Doctors treat rebound headache by tapering the medication that is being overused. Sometimes your caregiver will gradually substitute a different type of treatment or medication. Stopping may be a challenge. Regularly overusing a medication increases the potential for serious side effects. Consult a caregiver if you regularly use headache medications more than 2 days per week or more than the label advises. ADDITIONAL QUESTIONS AND ANSWERS Q: What is biofeedback? A: Biofeedback is a self-help treatment. Biofeedback uses special equipment to monitor your body's involuntary physical responses. Biofeedback monitors:  Breathing.  Pulse.  Heart rate.  Temperature.  Muscle tension.  Brain activity. Biofeedback helps you refine and perfect your relaxation exercises. You learn to control the physical responses that are related to stress. Once the technique has been mastered, you do not need the equipment any more. Q: Are headaches hereditary? A: Four out of five (80%) of people that suffer report a family history of migraine. Scientists are not sure if this is genetic or a family predisposition. Despite the uncertainty, a child has a 50% chance of having migraine if one parent suffers. The child has a 75% chance if both parents suffer.  Q: Can children get headaches? A: By the time they reach high school, most young people have experienced some type of headache. Many safe and effective approaches or medications can prevent a headache from occurring or stop it after it has begun.  Q: What type of doctor should I see to diagnose and treat my headache? A: Start with your primary caregiver. Discuss his or her experience and approach to headaches. Discuss methods of classification, diagnosis, and treatment. Your caregiver may decide to recommend you to a headache specialist, depending upon your  symptoms or other physical conditions. Having diabetes, allergies, etc., may require a more comprehensive and inclusive approach to your headache. The National Headache Foundation will provide, upon request, a list of [facility] physician members in your state. Document Released: 07/27/2003 Document Revised: 07/29/2011 Document Reviewed: 01/04/2008 [facility] Patient Information 2015 [place], [place]. This information is not intended to replace advice given to you by your health care provider. Make sure you discuss any questions you have with your health care provider.
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
## 2014-01-06 NOTE — ED Provider Notes (Signed)
Medical screening examination/treatment/procedure(s) were performed by non-physician practitioner and as supervising physician I was immediately available for consultation/collaboration.   EKG Interpretation None        Toy CookeyMegan Tanor Glaspy, MD 01/06/14 1132

## 2014-06-01 ENCOUNTER — Encounter: Payer: Self-pay | Admitting: Neurology

## 2014-06-01 ENCOUNTER — Ambulatory Visit: Payer: Self-pay | Admitting: Neurology

## 2014-06-01 ENCOUNTER — Ambulatory Visit (INDEPENDENT_AMBULATORY_CARE_PROVIDER_SITE_OTHER): Payer: Medicare Other | Admitting: Neurology

## 2014-06-01 VITALS — BP 116/80 | HR 70 | Resp 14 | Ht 65.0 in | Wt 230.4 lb

## 2014-06-01 DIAGNOSIS — M542 Cervicalgia: Secondary | ICD-10-CM

## 2014-06-01 DIAGNOSIS — G43709 Chronic migraine without aura, not intractable, without status migrainosus: Secondary | ICD-10-CM

## 2014-06-01 MED ORDER — OXYCODONE HCL 5 MG PO TABS: 5.0000 mg | ORAL_TABLET | Freq: Four times a day (QID) | ORAL | Status: DC | PRN

## 2014-06-01 MED ORDER — OXYCODONE HCL 15 MG PO TABS: 15.0000 mg | ORAL_TABLET | Freq: Three times a day (TID) | ORAL | Status: DC | PRN

## 2014-06-01 MED ORDER — LEVETIRACETAM 750 MG PO TABS: 750.0000 mg | ORAL_TABLET | Freq: Three times a day (TID) | ORAL | Status: DC

## 2014-06-01 MED ORDER — ALPRAZOLAM 1 MG PO TABS: 1.0000 mg | ORAL_TABLET | Freq: Three times a day (TID) | ORAL | Status: DC | PRN

## 2014-06-01 NOTE — Progress Notes (Signed)
[facility]  PATIENT: Connie Arnold DOB: [birth year]  REFERRING CLINICIAN: Josiah Lobo  HISTORY FROM: patient REASON FOR VISIT: Chronic Migraines   HISTORICAL  CHIEF COMPLAINT:  Chief Complaint  Patient presents with  . Migraine    HISTORY OF PRESENT ILLNESS:  Connie Arnold is a 49 old woman with chronic migraine headaches for years. The headaches are located predominantly on the right and are present in the occiput as well as above the eye. She reports rare nausea but no vomiting. There is some photophobia but not much phonophobia. Moving the head can increase the headache. When the headache occurs, the pain will be delivered better if she lays down and takes a baclofen and oxycodone.  She notes some tenderness around the eyes, more on the right. There is no actual numbness or facial weakness. Her arms hurt and she sometimes has tingling in the hands. There is no arm weakness.    She denies vision changes  Currently her headache has been present for many days. She has received a lot of benefit from occipital nerve blocks in the past. She would like to have another shot today. She had been on Botox for migraine prophylaxis in the past with some benefit. However, with changes in insurance she was unable to continue. Her co-pay is very high.   She is currently on Topamax 200 mg by mouth twice a day. This has helped her more initially than it does now. She did not get as much benefit from sinus and migraine in the past. She has never been on Keppra. She has tried several muscle relaxants in the past without much sustained benefit. However, she takes baclofen when she has a headache she feels a little better. She is unable to tolerate the anti-inflammatories due to mild renal insufficiency.  REVIEW OF SYSTEMS:  Constitutional: No fevers, chills, sweats, or change in appetite Eyes: No visual changes, double vision, eye pain Ear, nose and throat: No hearing loss, ear  pain, nasal congestion, sore throat Cardiovascular: No chest pain, palpitations Respiratory:  No shortness of breath at rest or with exertion.   No wheezes GastrointestinaI: No nausea, vomiting.   She has IBS Genitourinary:  No dysuria, urinary retention or frequency.  No nocturia. Musculoskeletal:  No neck pain, back pain Integumentary: No rash, pruritus, skin lesions Neurological: as above Psychiatric: No depression at this time.  No anxiety Endocrine:  She has diabetes. No palpitations, diaphoresis, change in appetite, change in weigh or increased thirst Hematologic/Lymphatic:  No anemia, purpura, petechiae. Allergic/Immunologic: She has had some seasonal allergies and sinusitis in the past.   ALLERGIES: Allergies  Allergen Reactions  . Codeine   . Imitrex [Sumatriptan] Nausea And Vomiting  . Ramipril     HOME MEDICATIONS: Outpatient Prescriptions Prior to Visit  Medication Sig Dispense Refill  . diltiazem (MATZIM LA) 180 MG 24 hr tablet Take 180 mg by mouth daily.    Marland Kitchen HYDROcodone-acetaminophen (NORCO/VICODIN) 5-325 MG per tablet Take 1-2 tablets by mouth every 6 (six) hours as needed. 10 tablet 0  . hydroxychloroquine (PLAQUENIL) 200 MG tablet Take 400 mg by mouth daily.     . hydroxychloroquine (PLAQUENIL) 200 MG tablet Take 1 tablet (200 mg total) by mouth 2 (two) times daily. 30 tablet 0  . METFORMIN HCL PO Take by mouth.    . Montelukast Sodium (SINGULAIR PO) Take by mouth.    . NABUMETONE PO Take by mouth.    . predniSONE (DELTASONE) 10 MG tablet 6,5,4,3,2,1  taper 21 tablet 0  . Topiramate (TOPAMAX PO) Take 400 mg by mouth daily.     Marland Kitchen topiramate (TOPAMAX) 200 MG tablet Take 1 tablet (200 mg total) by mouth 2 (two) times daily. 30 tablet 0  . azithromycin (ZITHROMAX) 250 MG tablet Take 1 tablet (250 mg total) by mouth daily. Take first 2 tablets together, then 1 every day until finished. 6 tablet 0   No facility-administered medications prior to visit.    PAST MEDICAL  HISTORY: Past Medical History  Diagnosis Date  . Asthma   . Lupus   . Fibromyalgia   . Migraines   . Hypertension   . IBS (irritable bowel syndrome)   . Interstitial cystitis   . Diabetes mellitus without complication   . Lupus (systemic lupus erythematosus)   . Pseudotumor cerebri   . Chronic kidney disease     PAST SURGICAL HISTORY: Past Surgical History  Procedure Laterality Date  . Abdominal hysterectomy    . Cholecystectomy    . Appendectomy      FAMILY HISTORY: Family History  Problem Relation Age of Onset  . Graves' disease Mother   . Heart disease Father   . Prostate cancer Father   . Healthy Brother   . Healthy Maternal Aunt   . Healthy Maternal Uncle     SOCIAL HISTORY:  History   Social History  . Marital Status: Married    Spouse Name: N/A    Number of Children: N/A  . Years of Education: N/A   Occupational History  . Not on file.   Social History Main Topics  . Smoking status: Never Smoker   . Smokeless tobacco: Not on file  . Alcohol Use: Not on file  . Drug Use: Not on file  . Sexual Activity: Not on file   Other Topics Concern  . Not on file   Social History Narrative     PHYSICAL EXAM  Filed Vitals:   06/01/14 1008  BP: 116/80  Pulse: 70  Resp: 14  Height:  (1.651 m)  Weight: 230 lb 6.4 oz (104.509 kg)    Body mass index is 38.34 kg/(m^2).   General: The patient is an obese, well-developed woman in no acute distress  Eyes:  Funduscopic exam shows normal optic discs and retinal vessels.  Neck: The neck is supple, no carotid bruits are noted.  The neck is tender over the occiput > lower paraspinal muscles.  Good ROM  Respiratory: The respiratory examination is clear.  Cardiovascular: The cardiovascular examination reveals a regular rate and rhythm, no murmurs, gallops or rubs are noted.  Skin: Extremities are without significant edema.  Neurologic Exam  Mental status: The patient is alert and oriented x 3 at  the time of the examination. The patient has apparent normal recent and remote memory, with an apparently normal attention span and concentration ability.   Speech is normal.  Cranial nerves: Extraocular movements are full. Pupils are equal, round, and reactive to light and accomodation.    Facial symmetry is present. There is good facial sensation to soft touch bilaterally.Facial strength is normal.  Trapezius and sternocleidomastoid strength is normal. No dysarthria is noted.  The tongue is midline, and the patient has symmetric elevation of the soft palate. No obvious hearing deficits are noted.  Motor:  Muscle bulk and tone are normal. Strength is  5 / 5 in all 4 extremities.   Sensory: Sensory testing is intact to touch and vibratory sensation in all 4  extremities.  Coordination: Cerebellar testing reveals good finger-nose-finger and heel-to-shin bilaterally.  Gait and station: Station and gait are normal. Tandem gait is normal. Romberg is negative.   Reflexes: Deep tendon reflexes are symmetric and normal bilaterally. Plantar responses are normal.    DIAGNOSTIC DATA (LABS, IMAGING, TESTING) - I reviewed patient records, labs, notes, testing and imaging myself where available.  Lab Results  Component Value Date   WBC 8.9 07/29/2007   HGB 11.8 07/29/2007   HCT 35.3 07/29/2007   MCV 83.6 07/29/2007   PLT 347 07/29/2007      Component Value Date/Time   NA 137 07/08/2007 1100   K 3.6 07/08/2007 1100   CL 100 07/08/2007 1100   CO2 26 07/08/2007 1100   GLUCOSE 78 07/08/2007 1100   BUN 10 07/08/2007 1100   CREATININE 0.75 07/08/2007 1100   CALCIUM 9.0 07/08/2007 1100   PROT 7.6 07/08/2007 1100   ALBUMIN 3.6 07/08/2007 1100   AST 36 07/08/2007 1100   ALT 11 07/08/2007 1100   ALKPHOS 110 07/08/2007 1100   BILITOT 0.4 07/08/2007 1100   No results found for: CHOL, HDL, LDLCALC, LDLDIRECT, TRIG, CHOLHDL No results found for: ZOXW9U No results found for: VITAMINB12 No results  found for: TSH No components found for: VITAMIND     ASSESSMENT AND PLAN  In summary, Ms. Deyanira Fesler is a 49 year old woman a long history of chronic migraines and neck pain. Current pain is similar to pain that she has had in the past. The pain was best treated with Botox therapy but her co-pay is too high.    She has received benefit asked from occipital nerve blocks and/or trigger point injections into the splenius capitis and cervical paraspinal muscles. I proceeded to inject both of the splenius capitis muscles and the C6-C7 paraspinal muscles with a total of 80 mg of Solu-Medrol in 4 mL of Marcaine. She tolerated the procedure well. She is currently on a fairly high dose oxycodone, 15 mg by mouth 3 times a day I'm reluctant to push that up any further so we will continue her at this dose. I will have her wean off of the topiramate as it no longer seems to be helping her as much and will have her titrate levetiracetam up to a dose of 750 mg by mouth 3 times a day.  She will return to see [place] in about 4 months or sooner if her symptoms worsen or new symptoms develop.   Essance Gatti A. Epimenio Foot, MD, PhD 06/01/2014, 10:37 AM Certified in Neurology, Clinical Neurophysiology, Sleep Medicine, Pain Medicine and Neuroimaging  [facility] [address] [place], [place] [address] [phone]

## 2014-06-30 ENCOUNTER — Other Ambulatory Visit: Payer: Self-pay | Admitting: Neurology

## 2014-06-30 MED ORDER — OXYCODONE HCL 15 MG PO TABS: 15.0000 mg | ORAL_TABLET | Freq: Three times a day (TID) | ORAL | Status: DC | PRN

## 2014-06-30 NOTE — Telephone Encounter (Signed)
Pt is calling requesting a hand written Rx for oxyCODONE (ROXICODONE) 15 MG immediate release tablet. Please call when ready for pick up.

## 2014-06-30 NOTE — Telephone Encounter (Signed)
Request entered, forwarded to provider for approval.  

## 2014-07-05 ENCOUNTER — Telehealth: Payer: Self-pay | Admitting: Neurology

## 2014-07-05 NOTE — Telephone Encounter (Signed)
After 3 failed call attempts, the patient's husband, Mr. Macfadyen, reached out to me to see if I could find out whether her wife's pain prescription was ready to be picked up. Since I could not reach anybody in the Triage Department, I told Mr. Kissler that I would find out and will give him a call back.

## 2014-07-27 ENCOUNTER — Other Ambulatory Visit: Payer: Self-pay | Admitting: Neurology

## 2014-07-27 MED ORDER — OXYCODONE HCL 15 MG PO TABS: 15.0000 mg | ORAL_TABLET | Freq: Three times a day (TID) | ORAL | Status: DC | PRN

## 2014-07-27 NOTE — Telephone Encounter (Signed)
Patient requesting refill for Rx oxyCODONE (ROXICODONE) 15 MG immediate release tablet.  Please call when ready for pick up.

## 2014-07-28 ENCOUNTER — Telehealth: Payer: Self-pay

## 2014-07-28 NOTE — Telephone Encounter (Signed)
Called patient and informed Rx ready for pick up at front desk. Patient verbalized understanding.  

## 2014-08-04 ENCOUNTER — Telehealth: Payer: Self-pay | Admitting: Neurology

## 2014-08-04 NOTE — Telephone Encounter (Signed)
FYI - Patient wanted to make Dr. Epimenio Foot aware that she didn't wean off Rx topiramate (TOPAMAX) 200 MG tablet and start Rx levETIRAcetam (KEPPRA) 750 MG tablet due to kidney issues.  She's recently had surgery and was reluctant to starting a new medication.

## 2014-08-10 ENCOUNTER — Encounter: Payer: Self-pay | Admitting: Neurology

## 2014-08-10 ENCOUNTER — Ambulatory Visit (INDEPENDENT_AMBULATORY_CARE_PROVIDER_SITE_OTHER): Payer: Medicare Other | Admitting: Neurology

## 2014-08-10 VITALS — BP 116/80 | HR 64 | Resp 16 | Ht 65.0 in | Wt 231.0 lb

## 2014-08-10 DIAGNOSIS — G43709 Chronic migraine without aura, not intractable, without status migrainosus: Secondary | ICD-10-CM | POA: Insufficient documentation

## 2014-08-10 DIAGNOSIS — F332 Major depressive disorder, recurrent severe without psychotic features: Secondary | ICD-10-CM | POA: Diagnosis not present

## 2014-08-10 DIAGNOSIS — M542 Cervicalgia: Secondary | ICD-10-CM | POA: Diagnosis not present

## 2014-08-10 DIAGNOSIS — G932 Benign intracranial hypertension: Secondary | ICD-10-CM

## 2014-08-10 MED ORDER — TOPIRAMATE 200 MG PO TABS: 200.0000 mg | ORAL_TABLET | Freq: Two times a day (BID) | ORAL | Status: DC

## 2014-08-10 MED ORDER — BACLOFEN 10 MG PO TABS: 10.0000 mg | ORAL_TABLET | Freq: Three times a day (TID) | ORAL | Status: DC

## 2014-08-10 NOTE — Progress Notes (Signed)
[facility]  PATIENT: Connie Arnold DOB: [birth year]  REASON FOR VISIT: Chronic Migraines,neck pain, depression   HISTORICAL  CHIEF COMPLAINT:  Chief Complaint  Patient presents with  . Headache    Sts. she got about 2 weeks of neck/shoulder pain/headache with tpi's given at last ov.  Sts. she is having more neck pain, bilat shoulder pain, headache over the last week and is requesting tpi's again today if appropriate./fim    HISTORY OF PRESENT ILLNESS:  Connie Arnold is a 35 old woman with chronic migraine headaches for years. Headaches worsened about 2 weeks ago.   The headaches are located predominantly on the right and pain is present in the occiput and upper back/shoulders as well as above the eye. She reports mild nausea but no vomiting. There is some photophobia but not phonophobia. Moving the head can increase the headache. When the headache occurs, the pain will be delivered better if she lays down and takes a baclofen and oxycodone.  She gets a vibrating sensation in her head but has not had this type of sensation for a couple years.    There is no actual numbness or facial weakness. Her arms hurt and she sometimes has tingling in the hands. There is no arm weakness.    She notes vision changes with the current HA -- blurred bilaterally.   She had papilledema in the past and was diagnosed with Idiopathic intracranial hypertension, though pressures were not elevated at follow up LP years ago.     She is feeling more tired but notes a recent URI and several procedures (colonoscopy and cystoscopy).    Because of this she decided not to change from Topiramate to Keppra.    Mood has ben an issue and she is separated.   We discussed anidepressants and she prefers not to use one at this point as not helpful in the past.  She has an appt to see counseling.    Currently her headache are daily and she has daily headaches most of the time.    She has received a lot of  benefit from occipital nerve blocks in the past. She would like to have another shot today. She had been on Botox for migraine prophylaxis in the past with benefit. However, with changes in insurance she was unable to continue due to very high co-pay.   She is currently on Topamax 200 mg by mouth twice a day. This helped her more initially than it does now. She did not get as much benefit from other medications (zonisamide, diamox, amitriptyline, baclofen, NSAIDs) in the past. She has never been on Keppra. She has tried several muscle relaxants in the past without much sustained benefit. However, she takes baclofen when she has a headache she feels a little better. She is unable to tolerate the anti-inflammatories due to mild renal insufficiency.  REVIEW OF SYSTEMS:  Constitutional: No fevers, chills, sweats, or change in appetite.  More fatigued Eyes: Some visual changes.  No double vision, eye pain Ear, nose and throat: No hearing loss, ear pain, nasal congestion, sore throat Cardiovascular: No chest pain, palpitations Respiratory:  No shortness of breath at rest or with exertion.   No wheezes GastrointestinaI: No nausea, vomiting.   She has IBS Genitourinary:  No dysuria, urinary retention or frequency.  No nocturia. Musculoskeletal:  No neck pain, back pain Integumentary: No rash, pruritus, skin lesions Neurological: as above Psychiatric: reports depression and anxiety.  Plans to see counseling Endocrine:  She has diabetes. No palpitations, diaphoresis, change in appetite, change in weigh or increased thirst Hematologic/Lymphatic:  No anemia, purpura, petechiae. Allergic/Immunologic: She has had some seasonal allergies and sinusitis in the past.   ALLERGIES: Allergies  Allergen Reactions  . Codeine   . Imitrex [Sumatriptan] Nausea And Vomiting  . Ramipril     HOME MEDICATIONS: Outpatient Prescriptions Prior to Visit  Medication Sig Dispense Refill  . ALPRAZolam (XANAX) 1 MG tablet  Take 1 tablet (1 mg total) by mouth 3 (three) times daily as needed for anxiety. 90 tablet 3  . hydroxychloroquine (PLAQUENIL) 200 MG tablet Take 1 tablet (200 mg total) by mouth 2 (two) times daily. 30 tablet 0  . Montelukast Sodium (SINGULAIR PO) Take by mouth.    . oxyCODONE (ROXICODONE) 15 MG immediate release tablet Take 1 tablet (15 mg total) by mouth every 8 (eight) hours as needed for pain. 90 tablet 0  . sitaGLIPtin (JANUVIA) 100 MG tablet Take 100 mg by mouth daily.    Marland Kitchen topiramate (TOPAMAX) 200 MG tablet Take 1 tablet (200 mg total) by mouth 2 (two) times daily. 30 tablet 0  . diltiazem (MATZIM LA) 180 MG 24 hr tablet Take 180 mg by mouth daily.    . hydroxychloroquine (PLAQUENIL) 200 MG tablet Take 400 mg by mouth daily.     Marland Kitchen levETIRAcetam (KEPPRA) 750 MG tablet Take 1 tablet (750 mg total) by mouth 3 (three) times daily. (Patient not taking: Reported on 08/10/2014) 270 tablet 3  . METFORMIN HCL PO Take by mouth.    . NABUMETONE PO Take by mouth.    . oxyCODONE (ROXICODONE) 5 MG immediate release tablet Take 1 tablet (5 mg total) by mouth every 6 (six) hours as needed for severe pain. (Patient not taking: Reported on 08/10/2014) 120 tablet 0  . predniSONE (DELTASONE) 10 MG tablet 6,5,4,3,2,1 taper (Patient not taking: Reported on 08/10/2014) 21 tablet 0  . Topiramate (TOPAMAX PO) Take 400 mg by mouth daily.      No facility-administered medications prior to visit.    PAST MEDICAL HISTORY: Past Medical History  Diagnosis Date  . Asthma   . Lupus   . Fibromyalgia   . Migraines   . Hypertension   . IBS (irritable bowel syndrome)   . Interstitial cystitis   . Diabetes mellitus without complication   . Lupus (systemic lupus erythematosus)   . Pseudotumor cerebri   . Chronic kidney disease     PAST SURGICAL HISTORY: Past Surgical History  Procedure Laterality Date  . Abdominal hysterectomy    . Cholecystectomy    . Appendectomy      FAMILY HISTORY: Family History    Problem Relation Age of Onset  . Graves' disease Mother   . Heart disease Father   . Prostate cancer Father   . Healthy Brother   . Healthy Maternal Aunt   . Healthy Maternal Uncle     SOCIAL HISTORY:  History   Social History  . Marital Status: Married    Spouse Name: N/A  . Number of Children: N/A  . Years of Education: N/A   Occupational History  . Not on file.   Social History Main Topics  . Smoking status: Never Smoker   . Smokeless tobacco: Not on file  . Alcohol Use: Not on file  . Drug Use: Not on file  . Sexual Activity: Not on file   Other Topics Concern  . Not on file   Social History Narrative  PHYSICAL EXAM  Filed Vitals:   08/10/14 1114  BP: 116/80  Pulse: 64  Resp: 16  Height: 5\' 5"  (1.651 m)  Weight: 231 lb (104.781 kg)    Body mass index is 38.44 kg/(m^2).   General: The patient is an obese, well-developed woman in no acute distress  Eyes:  Funduscopic exam shows normal optic discs and retinal vessels.  Neck: The neck is supple, no carotid bruits are noted.  The neck is tender over the occiput > lower paraspinal muscles.  Good ROM  Respiratory: The respiratory examination is clear.  Cardiovascular: The cardiovascular examination reveals a regular rate and rhythm, no murmurs, gallops or rubs are noted.  Skin: Extremities are without significant edema.  Neurologic Exam  Mental status: The patient is alert and oriented x 3 at the time of the examination. The patient has apparent normal recent and remote memory, with an apparently normal attention span and concentration ability.   Speech is normal.  Cranial nerves: Extraocular movements are full. Pupils are equal, round, and reactive to light and accomodation.    Facial symmetry is present. There is good facial sensation to soft touch bilaterally.Facial strength is normal.  Trapezius and sternocleidomastoid strength is normal. No dysarthria is noted.  The tongue is midline, and the  patient has symmetric elevation of the soft palate. No obvious hearing deficits are noted.  Motor:  Muscle bulk and tone are normal. Strength is  5 / 5 in all 4 extremities.   Sensory: Sensory testing is intact to touch and vibratory sensation in all 4 extremities.  Coordination: Cerebellar testing reveals good finger-nose-finger and heel-to-shin bilaterally.  Gait and station: Station and gait are normal. Tandem gait is normal. Romberg is negative.   Reflexes: Deep tendon reflexes are symmetric and normal bilaterally. Plantar responses are normal.    DIAGNOSTIC DATA (LABS, IMAGING, TESTING) - I reviewed patient records, labs, notes, testing and imaging myself where available.      ASSESSMENT AND PLAN  Chronic migraine without aura without status migrainosus, not intractable  Neck pain  Idiopathic intracranial hypertension  Major depressive disorder, recurrent, severe without psychotic features    1.  Continue (renew) baclofen for muscle spasms/headache 2,. inject bilateral splenius capitis muscles ,trapezius muscles,rhomboid muscles and C6-C7 paraspinal muscles with a total of 80 mg of Solu-Medrol in 8 mL of Marcaine.  3.  She prefers not to switch to Keppra so I will renew topiramte 4.  Advised to follow through with counseling for mood.prefers not to start antidepressant as not helpful in past. 5.   Ifvision worsens, consider VF testing or referral to ophtho She will return to see [place] in about 4 months or sooner if her symptoms worsen or new symptoms develop.   Simara Rhyner A. Epimenio Foot, MD, PhD 08/10/2014, 11:17 AM Certified in Neurology, Clinical Neurophysiology, Sleep Medicine, Pain Medicine and Neuroimaging  [facility] [address], [address] [place], [place] [address] [phone]

## 2014-08-11 ENCOUNTER — Telehealth: Payer: Self-pay | Admitting: Neurology

## 2014-08-11 NOTE — Telephone Encounter (Signed)
Patient is calling because Rx's topiramate (TOPAMAX) 200 MG tablet and baclofen (LIORESAL) 10 MG tablet was sent to [facility] but only for 30 days and should be for 90 days. Please resend. Thank you.

## 2014-08-25 ENCOUNTER — Ambulatory Visit (INDEPENDENT_AMBULATORY_CARE_PROVIDER_SITE_OTHER): Payer: Medicare Other | Admitting: Neurology

## 2014-08-25 ENCOUNTER — Encounter: Payer: Self-pay | Admitting: Neurology

## 2014-08-25 VITALS — BP 134/82 | HR 66 | Resp 14 | Ht 65.0 in | Wt 220.0 lb

## 2014-08-25 DIAGNOSIS — M7551 Bursitis of right shoulder: Secondary | ICD-10-CM | POA: Diagnosis not present

## 2014-08-25 DIAGNOSIS — F332 Major depressive disorder, recurrent severe without psychotic features: Secondary | ICD-10-CM | POA: Diagnosis not present

## 2014-08-25 DIAGNOSIS — M5010 Cervical disc disorder with radiculopathy, unspecified cervical region: Secondary | ICD-10-CM | POA: Diagnosis not present

## 2014-08-25 DIAGNOSIS — M542 Cervicalgia: Secondary | ICD-10-CM

## 2014-08-25 DIAGNOSIS — M7552 Bursitis of left shoulder: Secondary | ICD-10-CM | POA: Diagnosis not present

## 2014-08-25 DIAGNOSIS — G43709 Chronic migraine without aura, not intractable, without status migrainosus: Secondary | ICD-10-CM | POA: Diagnosis not present

## 2014-08-25 DIAGNOSIS — M7550 Bursitis of unspecified shoulder: Secondary | ICD-10-CM | POA: Insufficient documentation

## 2014-08-25 MED ORDER — BACLOFEN 10 MG PO TABS: ORAL_TABLET | ORAL | Status: DC

## 2014-08-25 MED ORDER — OXYCODONE HCL 15 MG PO TABS: 15.0000 mg | ORAL_TABLET | Freq: Three times a day (TID) | ORAL | Status: DC | PRN

## 2014-08-25 NOTE — Progress Notes (Signed)
[facility]  PATIENT: Connie Arnold DOB: [birth year]  REASON FOR VISIT: Chronic Migraines,neck pain, depression   HISTORICAL  CHIEF COMPLAINT:  Chief Complaint  Patient presents with  . Neck Pain    Sts. worsening neck pain, h/a since yesterday.  She also c/o cramping upper legs while walking, weakness in both legs- onset yesterday/fim  . Headache  . Back Pain    HISTORY OF PRESENT ILLNESS:  Connie Arnold is a 78 old woman with chronic migraine headaches and neck pain for years. Headaches improved after an occipital nerve block / splenius capitus TPI  about 3 weeks ago.     However, neck pain worsened again 10 days ago.  Current pain starts in the neck and shoots down her back when she turns her head.   She also notes more pain in the shoulders than typical.  Raising her arm is painful at the shoulders.  She denies any arm pain but she gets pain down her spine when she flexes her neck forward.    She will get a shaking sensation in her right arm when she flexes her arm in tight.         Although HA pain is not as severe as a few weeks ago, she still has pain on the right present in the occiput and upper back/shoulders as well as above the eye. No current nausea. There is mild photophobia but not phonophobia. Moving the head can increase the headache. When the headache occurs, baclofen and oxycodone help.  There is no arm weakness.      She notes vision changes with the current HA -- blurred bilaterally maybe slightly worse than last year.   She had papilledema in the past and was diagnosed with Idiopathic intracranial hypertension, though pressures were not elevated at follow up LP years ago.     She is feeling more fatigued and does not sleep well.     Mood has ben an issue and she is separated.   We discussed anidepressants and she prefers not to use one at this point as not helpful in the past.  She has an appt to see counseling.    She had been on Botox for  migraine prophylaxis in the past with the most benefit. However, with changes in insurance she was unable to continue due to very high co-pay.   She is currently on Topamax 200 mg by mouth twice a day. This helped her more initially than it does now. She did not get as much benefit from other medications (zonisamide, diamox, amitriptyline, baclofen, NSAIDs) in the past.  She has tried several muscle relaxants in the past without much sustained benefit. However, she takes baclofen when she has a headache she feels a little better. She is unable to tolerate the anti-inflammatories due to mild renal insufficiency.  REVIEW OF SYSTEMS:  Constitutional: No fevers, chills, sweats, or change in appetite.  More fatigued Eyes: Some visual changes.  No double vision, eye pain Ear, nose and throat: No hearing loss, ear pain, nasal congestion, sore throat Cardiovascular: No chest pain, palpitations Respiratory:  No shortness of breath at rest or with exertion.   No wheezes GastrointestinaI: No nausea, vomiting.   She has IBS Genitourinary:  No dysuria, urinary retention or frequency.  No nocturia. Musculoskeletal:  No neck pain, back pain Integumentary: No rash, pruritus, skin lesions Neurological: as above Psychiatric: reports depression and anxiety.  Plans to see counseling Endocrine:  She has diabetes. No palpitations,  diaphoresis, change in appetite, change in weigh or increased thirst Hematologic/Lymphatic:  No anemia, purpura, petechiae. Allergic/Immunologic: She has had some seasonal allergies and sinusitis in the past.   ALLERGIES: Allergies  Allergen Reactions  . Codeine   . Imitrex [Sumatriptan] Nausea And Vomiting  . Ramipril     HOME MEDICATIONS: Outpatient Prescriptions Prior to Visit  Medication Sig Dispense Refill  . ALPRAZolam (XANAX) 1 MG tablet Take 1 tablet (1 mg total) by mouth 3 (three) times daily as needed for anxiety. 90 tablet 3  . baclofen (LIORESAL) 10 MG tablet Take 1  tablet (10 mg total) by mouth 3 (three) times daily. 270 each 3  . diltiazem (MATZIM LA) 180 MG 24 hr tablet Take 180 mg by mouth daily.    . furosemide (LASIX) 40 MG tablet     . hydroxychloroquine (PLAQUENIL) 200 MG tablet Take 1 tablet (200 mg total) by mouth 2 (two) times daily. 30 tablet 0  . Montelukast Sodium (SINGULAIR PO) Take by mouth.    . oxyCODONE (ROXICODONE) 15 MG immediate release tablet Take 1 tablet (15 mg total) by mouth every 8 (eight) hours as needed for pain. 90 tablet 0  . sitaGLIPtin (JANUVIA) 100 MG tablet Take 100 mg by mouth daily.    . Topiramate (TOPAMAX PO) Take 400 mg by mouth daily.     Marland Kitchen topiramate (TOPAMAX) 200 MG tablet Take 1 tablet (200 mg total) by mouth 2 (two) times daily. 180 tablet 3  . hydroxychloroquine (PLAQUENIL) 200 MG tablet Take 400 mg by mouth daily.     Marland Kitchen levETIRAcetam (KEPPRA) 750 MG tablet Take 1 tablet (750 mg total) by mouth 3 (three) times daily. (Patient not taking: Reported on 08/10/2014) 270 tablet 3  . METFORMIN HCL PO Take by mouth.    . NABUMETONE PO Take by mouth.    . oxyCODONE (ROXICODONE) 5 MG immediate release tablet Take 1 tablet (5 mg total) by mouth every 6 (six) hours as needed for severe pain. (Patient not taking: Reported on 08/10/2014) 120 tablet 0  . predniSONE (DELTASONE) 10 MG tablet 6,5,4,3,2,1 taper (Patient not taking: Reported on 08/10/2014) 21 tablet 0   No facility-administered medications prior to visit.    PAST MEDICAL HISTORY: Past Medical History  Diagnosis Date  . Asthma   . Lupus   . Fibromyalgia   . Migraines   . Hypertension   . IBS (irritable bowel syndrome)   . Interstitial cystitis   . Diabetes mellitus without complication   . Lupus (systemic lupus erythematosus)   . Pseudotumor cerebri   . Chronic kidney disease     PAST SURGICAL HISTORY: Past Surgical History  Procedure Laterality Date  . Abdominal hysterectomy    . Cholecystectomy    . Appendectomy      FAMILY HISTORY: Family  History  Problem Relation Age of Onset  . Graves' disease Mother   . Heart disease Father   . Prostate cancer Father   . Healthy Brother   . Healthy Maternal Aunt   . Healthy Maternal Uncle     SOCIAL HISTORY:  History   Social History  . Marital Status: Married    Spouse Name: N/A  . Number of Children: N/A  . Years of Education: N/A   Occupational History  . Not on file.   Social History Main Topics  . Smoking status: Never Smoker   . Smokeless tobacco: Not on file  . Alcohol Use: Not on file  . Drug Use: Not  on file  . Sexual Activity: Not on file   Other Topics Concern  . Not on file   Social History Narrative     PHYSICAL EXAM  Filed Vitals:   08/25/14 1306  BP: 134/82  Pulse: 66  Resp: 14  Height:  (1.651 m)  Weight: 220 lb (99.791 kg)    Body mass index is 36.61 kg/(m^2).   General: The patient is an obese, well-developed woman in no acute distress  Eyes:  Funduscopic exam shows normal optic discs and retinal vessels.  Neck: The neck is supple, no carotid bruits are noted.  The neck is tender over the occiput > lower paraspinal muscles.  Good ROM  Skin: Extremities are without significant edema.  Musculoskeletal:   She has severe tenderness over the subacromial bursae bilaterally. There is moderate tenderness in the cervical paraspinal muscles and mild to moderate tenderness in the capitis muscles. Shoulders are painful with range of motion activity.  Neurologic Exam  Mental status: The patient is alert and oriented x 3 at the time of the examination. The patient has apparent normal recent and remote memory, with an apparently normal attention span and concentration ability.   Speech is normal.  Cranial nerves: Extraocular movements are full. Pupils are equal, round, and reactive to light and accomodation.    Facial symmetry is present. There is good facial sensation to soft touch bilaterally.Facial strength is normal.  Trapezius and  sternocleidomastoid strength is normal. No dysarthria is noted.  The tongue is midline, and the patient has symmetric elevation of the soft palate. No obvious hearing deficits are noted.  Motor:  Muscle bulk and tone are normal. Strength is  5 / 5 in all 4 extremities.   Sensory: Sensory testing is intact to touch and vibratory sensation in all 4 extremities.  Coordination: Cerebellar testing reveals good finger-nose-finger and heel-to-shin bilaterally.  Gait and station: Station and gait are normal. Tandem gait is normal. Romberg is negative.   Reflexes: Deep tendon reflexes are symmetric and normal bilaterally. Plantar responses are normal.    DIAGNOSTIC DATA (LABS, IMAGING, TESTING) - I reviewed patient records, labs, notes, testing and imaging myself where available.      ASSESSMENT AND PLAN  Chronic migraine without aura without status migrainosus, not intractable  Neck pain - Plan: MR Cervical Spine Wo Contrast  Major depressive disorder, recurrent, severe without psychotic features  Cervical disc disorder with radiculopathy of cervical region - Plan: MR Cervical Spine Wo Contrast  Bursitis, subacromial, right    1.  inject bilateral subacromial bursae with a total of 40 mg of Solu-Medrol in 3 mL of Marcaine.  2.  continue topiramate for migraine 3.   Exercise and remain active..  4.   If vision worsens, consider VF testing or referral to ophtho She will return to see [place] in about 4 months or sooner if her symptoms worsen or new symptoms develop.   Richard A. Epimenio Foot, MD, PhD 08/25/2014, 1:20 PM Certified in Neurology, Clinical Neurophysiology, Sleep Medicine, Pain Medicine and Neuroimaging  [facility] [address], [address] [place], [place] [address] [phone]

## 2014-08-27 ENCOUNTER — Encounter (HOSPITAL_BASED_OUTPATIENT_CLINIC_OR_DEPARTMENT_OTHER): Payer: Self-pay

## 2014-08-27 ENCOUNTER — Emergency Department (HOSPITAL_BASED_OUTPATIENT_CLINIC_OR_DEPARTMENT_OTHER)
Admission: EM | Admit: 2014-08-27 | Discharge: 2014-08-27 | Disposition: A | Discharge status: Home / Self Care | Payer: Medicare Other | Attending: Emergency Medicine | Admitting: Emergency Medicine

## 2014-08-27 DIAGNOSIS — N189 Chronic kidney disease, unspecified: Secondary | ICD-10-CM | POA: Insufficient documentation

## 2014-08-27 DIAGNOSIS — Z8739 Personal history of other diseases of the musculoskeletal system and connective tissue: Secondary | ICD-10-CM | POA: Diagnosis not present

## 2014-08-27 DIAGNOSIS — S3992XA Unspecified injury of lower back, initial encounter: Secondary | ICD-10-CM | POA: Diagnosis present

## 2014-08-27 DIAGNOSIS — Z79899 Other long term (current) drug therapy: Secondary | ICD-10-CM | POA: Insufficient documentation

## 2014-08-27 DIAGNOSIS — S161XXA Strain of muscle, fascia and tendon at neck level, initial encounter: Secondary | ICD-10-CM

## 2014-08-27 DIAGNOSIS — J45909 Unspecified asthma, uncomplicated: Secondary | ICD-10-CM | POA: Insufficient documentation

## 2014-08-27 DIAGNOSIS — I129 Hypertensive chronic kidney disease with stage 1 through stage 4 chronic kidney disease, or unspecified chronic kidney disease: Secondary | ICD-10-CM | POA: Insufficient documentation

## 2014-08-27 DIAGNOSIS — S39012A Strain of muscle, fascia and tendon of lower back, initial encounter: Secondary | ICD-10-CM | POA: Insufficient documentation

## 2014-08-27 DIAGNOSIS — S0990XA Unspecified injury of head, initial encounter: Secondary | ICD-10-CM | POA: Insufficient documentation

## 2014-08-27 DIAGNOSIS — E119 Type 2 diabetes mellitus without complications: Secondary | ICD-10-CM | POA: Insufficient documentation

## 2014-08-27 DIAGNOSIS — Y9389 Activity, other specified: Secondary | ICD-10-CM | POA: Diagnosis not present

## 2014-08-27 DIAGNOSIS — G8929 Other chronic pain: Secondary | ICD-10-CM | POA: Insufficient documentation

## 2014-08-27 DIAGNOSIS — Y998 Other external cause status: Secondary | ICD-10-CM | POA: Insufficient documentation

## 2014-08-27 DIAGNOSIS — Y92410 Unspecified street and highway as the place of occurrence of the external cause: Secondary | ICD-10-CM | POA: Diagnosis not present

## 2014-08-27 DIAGNOSIS — Z87448 Personal history of other diseases of urinary system: Secondary | ICD-10-CM | POA: Insufficient documentation

## 2014-08-27 DIAGNOSIS — Z8719 Personal history of other diseases of the digestive system: Secondary | ICD-10-CM | POA: Diagnosis not present

## 2014-08-27 DIAGNOSIS — S29012A Strain of muscle and tendon of back wall of thorax, initial encounter: Secondary | ICD-10-CM

## 2014-08-27 HISTORY — DX: Other chronic pain: G89.29

## 2014-08-27 HISTORY — DX: Dorsalgia, unspecified: M54.9

## 2014-08-27 MED ORDER — HYDROCODONE-ACETAMINOPHEN 5-325 MG PO TABS: 1.0000 | ORAL_TABLET | ORAL | Status: DC | PRN
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
## 2014-08-27 NOTE — ED Notes (Signed)
ED PAC at bedside. 

## 2014-08-27 NOTE — ED Notes (Signed)
Strong plantar and dorsal flexion noted bilaterly
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
## 2014-09-03 ENCOUNTER — Inpatient Hospital Stay: Admission: RE | Admit: 2014-09-03 | Payer: Self-pay | Source: Ambulatory Visit

## 2014-09-07 ENCOUNTER — Ambulatory Visit
Admission: RE | Admit: 2014-09-07 | Discharge: 2014-09-07 | Disposition: A | Discharge status: Home / Self Care | Payer: Medicare Other | Source: Ambulatory Visit | Attending: Neurology | Admitting: Neurology

## 2014-09-07 DIAGNOSIS — M542 Cervicalgia: Secondary | ICD-10-CM | POA: Diagnosis not present

## 2014-09-07 DIAGNOSIS — M5010 Cervical disc disorder with radiculopathy, unspecified cervical region: Secondary | ICD-10-CM

## 2014-09-13 ENCOUNTER — Telehealth: Payer: Self-pay | Admitting: *Deleted

## 2014-09-13 DIAGNOSIS — G43709 Chronic migraine without aura, not intractable, without status migrainosus: Secondary | ICD-10-CM

## 2014-09-13 NOTE — Telephone Encounter (Signed)
-----   Message from Asa Lente, MD sent at 09/13/2014 12:53 PM EDT ----- Please let her know cervical spine looks good ---   If still having headaches, I would like her to get visual field testing with ophtho to assess for the pseudotumor (high CSF pressure) that she had in past and make sure not worsening  (if vision is changing, we may need to do another LP)

## 2014-09-13 NOTE — Telephone Encounter (Signed)
Spoke with Connie Arnold and per RAS, advised that mri c-spine was normal, that she should see her opthalmologist and have visual field testing, and if that is abnormal, may need to repeat lp.  She verbalized understanding of same, sts. she has upcoming appt. with Dr. Severiano Gilbert.  I have faxed last ov note with request for visual field testing to Dr. Leane Para

## 2014-09-14 ENCOUNTER — Emergency Department (HOSPITAL_COMMUNITY)
Admission: EM | Admit: 2014-09-14 | Discharge: 2014-09-15 | Disposition: A | Discharge status: Home / Self Care | Payer: Medicare Other | Attending: Emergency Medicine | Admitting: Emergency Medicine

## 2014-09-14 ENCOUNTER — Encounter (HOSPITAL_COMMUNITY): Payer: Self-pay | Admitting: Emergency Medicine

## 2014-09-14 ENCOUNTER — Other Ambulatory Visit: Payer: Medicare Other

## 2014-09-14 DIAGNOSIS — R531 Weakness: Secondary | ICD-10-CM | POA: Diagnosis not present

## 2014-09-14 DIAGNOSIS — J45909 Unspecified asthma, uncomplicated: Secondary | ICD-10-CM | POA: Diagnosis not present

## 2014-09-14 DIAGNOSIS — Z79899 Other long term (current) drug therapy: Secondary | ICD-10-CM | POA: Insufficient documentation

## 2014-09-14 DIAGNOSIS — R202 Paresthesia of skin: Secondary | ICD-10-CM

## 2014-09-14 DIAGNOSIS — M797 Fibromyalgia: Secondary | ICD-10-CM | POA: Insufficient documentation

## 2014-09-14 DIAGNOSIS — I129 Hypertensive chronic kidney disease with stage 1 through stage 4 chronic kidney disease, or unspecified chronic kidney disease: Secondary | ICD-10-CM | POA: Diagnosis not present

## 2014-09-14 DIAGNOSIS — G8929 Other chronic pain: Secondary | ICD-10-CM | POA: Diagnosis not present

## 2014-09-14 DIAGNOSIS — Z8719 Personal history of other diseases of the digestive system: Secondary | ICD-10-CM | POA: Diagnosis not present

## 2014-09-14 DIAGNOSIS — E119 Type 2 diabetes mellitus without complications: Secondary | ICD-10-CM | POA: Insufficient documentation

## 2014-09-14 DIAGNOSIS — N189 Chronic kidney disease, unspecified: Secondary | ICD-10-CM | POA: Diagnosis not present

## 2014-09-14 DIAGNOSIS — R200 Anesthesia of skin: Secondary | ICD-10-CM | POA: Diagnosis present

## 2014-09-14 LAB — COMPREHENSIVE METABOLIC PANEL
ALBUMIN: 3.3 g/dL — AB (ref 3.5–5.2)
ALT: 21 U/L (ref 0–35)
AST: 52 U/L — ABNORMAL HIGH (ref 0–37)
Alkaline Phosphatase: 88 U/L (ref 39–117)
Anion gap: 10 (ref 5–15)
BUN: 12 mg/dL (ref 6–23)
CO2: 20 mmol/L (ref 19–32)
Calcium: 8.7 mg/dL (ref 8.4–10.5)
Chloride: 109 mmol/L (ref 96–112)
Creatinine, Ser: 0.98 mg/dL (ref 0.50–1.10)
GFR calc Af Amer: 79 mL/min — ABNORMAL LOW (ref >90)
GFR calc non Af Amer: 68 mL/min — ABNORMAL LOW (ref >90)
Glucose, Bld: 97 mg/dL (ref 70–99)
POTASSIUM: 3.2 mmol/L — AB (ref 3.5–5.1)
Sodium: 139 mmol/L (ref 135–145)
TOTAL PROTEIN: 7.2 g/dL (ref 6.0–8.3)
Total Bilirubin: 0.5 mg/dL (ref 0.3–1.2)

## 2014-09-14 LAB — CBC WITH DIFFERENTIAL/PLATELET
BASOS ABS: 0 10*3/uL (ref 0.0–0.1)
Basophils Relative: 0 % (ref 0–1)
Eosinophils Absolute: 0.3 10*3/uL (ref 0.0–0.7)
Eosinophils Relative: 3 % (ref 0–5)
HCT: 40.8 % (ref 36.0–46.0)
HEMOGLOBIN: 13.5 g/dL (ref 12.0–15.0)
LYMPHS ABS: 3.2 10*3/uL (ref 0.7–4.0)
LYMPHS PCT: 33 % (ref 12–46)
MCH: 29.8 pg (ref 26.0–34.0)
MCHC: 33.1 g/dL (ref 30.0–36.0)
MCV: 90.1 fL (ref 78.0–100.0)
MONOS PCT: 10 % (ref 3–12)
Monocytes Absolute: 1 10*3/uL (ref 0.1–1.0)
Neutro Abs: 5.3 10*3/uL (ref 1.7–7.7)
Neutrophils Relative %: 54 % (ref 43–77)
Platelets: 257 10*3/uL (ref 150–400)
RBC: 4.53 MIL/uL (ref 3.87–5.11)
RDW: 14.3 % (ref 11.5–15.5)
WBC: 9.8 10*3/uL (ref 4.0–10.5)

## 2014-09-14 LAB — I-STAT TROPONIN, ED: Troponin i, poc: 0 ng/mL (ref 0.00–0.08)

## 2014-09-14 NOTE — ED Notes (Signed)
Pt. reports numbness at forehead and face onset this afternoon , pt. also reported mild chest tightness and low back pain . Speech clear / no facial asymmetry , equal strong grips / no arm drift. CBG = 89 by EMS . Pt. Took 4 baby ASA prior to arrival .

## 2014-09-15 NOTE — Discharge Instructions (Signed)

## 2014-09-15 NOTE — ED Notes (Signed)
Pt stable, ambulatory, states understanding of discharge instructions, family at bedside. 

## 2014-09-15 NOTE — ED Provider Notes (Signed)
CSN: [id number]     Arrival date & time 09/14/14  1949 History   First MD Initiated Contact with Patient 09/14/14 2343     Chief Complaint  Patient presents with  . Numbness     (Consider location/radiation/quality/duration/timing/severity/associated sxs/prior Treatment) The history is provided by the patient.   patient presents with left facial numbness. Began around 1 afternoon today. No vision changes. No nausea vomiting. She does have a dull headache and she does get almost daily headaches. No weakness in her face. Reportedly also has some right leg weakness. No confusion. No nausea vomiting.  Past Medical History  Diagnosis Date  . Asthma   . Lupus   . Fibromyalgia   . Migraines   . Hypertension   . IBS (irritable bowel syndrome)   . Interstitial cystitis   . Diabetes mellitus without complication   . Lupus (systemic lupus erythematosus)   . Pseudotumor cerebri   . Chronic kidney disease   . Chronic back pain    Past Surgical History  Procedure Laterality Date  . Abdominal hysterectomy    . Cholecystectomy    . Appendectomy    . Cystoscopy     Family History  Problem Relation Age of Onset  . Graves' disease Mother   . Heart disease Father   . Prostate cancer Father   . Healthy Brother   . Healthy Maternal Aunt   . Healthy Maternal Uncle    History  Substance Use Topics  . Smoking status: Never Smoker   . Smokeless tobacco: Not on file  . Alcohol Use: No   OB History    No data available     Review of Systems  Constitutional: Negative for activity change and appetite change.  Eyes: Negative for pain.  Respiratory: Negative for chest tightness and shortness of breath.   Cardiovascular: Negative for chest pain and leg swelling.  Gastrointestinal: Negative for nausea, vomiting, abdominal pain and diarrhea.  Genitourinary: Negative for flank pain.  Musculoskeletal: Negative for back pain and neck stiffness.  Skin: Negative for rash.  Neurological:  Positive for weakness, numbness and headaches.  Psychiatric/Behavioral: Negative for behavioral problems.      Allergies  Codeine; Imitrex; Nsaids; and Ramipril  Home Medications   Prior to Admission medications   Medication Sig Start Date End Date Taking? Authorizing Provider  ALPRAZolam Prudy Feeler) 1 MG tablet Take 1 tablet (1 mg total) by mouth 3 (three) times daily as needed for anxiety. 06/11/14   Asa Lente, MD  baclofen (LIORESAL) 10 MG tablet 1 or 2 pills po tid 08/25/14   Asa Lente, MD  diltiazem (MATZIM LA) 180 MG 24 hr tablet Take 180 mg by mouth daily.    Historical Provider, MD  furosemide (LASIX) 40 MG tablet  08/05/14   Historical Provider, MD  HYDROcodone-acetaminophen (NORCO/VICODIN) 5-325 MG per tablet Take 1-2 tablets by mouth every 4 (four) hours as needed. 08/27/14   Kathrynn Speed, PA-C  hydroxychloroquine (PLAQUENIL) 200 MG tablet Take 1 tablet (200 mg total) by mouth 2 (two) times daily. 01/05/14   Teressa Lower, NP  Montelukast Sodium (SINGULAIR PO) Take by mouth.    Historical Provider, MD  oxyCODONE (ROXICODONE) 15 MG immediate release tablet Take 1 tablet (15 mg total) by mouth every 8 (eight) hours as needed for pain. Fill After 08/28/2014 08/28/14   Asa Lente, MD  sitaGLIPtin (JANUVIA) 100 MG tablet Take 100 mg by mouth daily.    Historical Provider, MD  Topiramate (TOPAMAX  PO) Take 400 mg by mouth daily.     Historical Provider, MD  topiramate (TOPAMAX) 200 MG tablet Take 1 tablet (200 mg total) by mouth 2 (two) times daily. 08/10/14   Asa Lente, MD   BP 105/63 mmHg  Pulse 60  Temp(Src) 97.5 F (36.4 C)  Resp 19  Ht  (1.651 m)  SpO2 99% Physical Exam  Constitutional: She appears well-developed and well-nourished.  HENT:  Head: Atraumatic.  Eyes: EOM are normal.  Neck: Neck supple.  Cardiovascular: Normal rate.   Pulmonary/Chest: Effort normal.  Abdominal: Soft. Bowel sounds are normal.  Musculoskeletal: Normal range of motion.   Neurological:  Patient with slight paresthesias to left face. Eye movements intact. Sensation grossly intact. Equal smile. Good dressing bilaterally. Good flexion-extension upper extremity's. Slight paresthesias to tips of fingers bilaterally. Good straight leg raise bilaterally. Subjectively patient states her right leg is weaker.    ED Course  Procedures (including critical care time) Labs Review Labs Reviewed  COMPREHENSIVE METABOLIC PANEL - Abnormal; Notable for the following:    Potassium 3.2 (*)    Albumin 3.3 (*)    AST 52 (*)    GFR calc non Af Amer 68 (*)    GFR calc Af Amer 79 (*)    All other components within normal limits  CBC WITH DIFFERENTIAL/PLATELET  I-STAT TROPOININ, ED    Imaging Review No results found.   EKG Interpretation None      MDM   Final diagnoses:  Paresthesias    Patient with some paresthesias. Difficult to get distribution anatomically. Left face bilateral fingers and questionable weakness in her right leg. Does have a dull headache and could become acute migraine. Will discharge home.    Benjiman Core, MD 09/15/14 [phone]

## 2014-09-19 NOTE — Telephone Encounter (Signed)
Patient called and stated that you had spoken last week about repeating lp and she thinks that she may possibly need to do that now. Please call and advise.

## 2014-09-19 NOTE — Telephone Encounter (Addendum)
LMOM (identified vm) for Massie to call.  Per RAS, ok for lp, for pressure and labs.  He will enter labs.  LP should be ordered to be done at [facility]/fim Pt. requesting lp to be sched asap--prior to 5-17/fim

## 2014-09-21 NOTE — Telephone Encounter (Signed)
Spoke with Connie Arnold and offered lp, to be done at [facility], as RAS is not able to do that in the office (needs fluoro).  She is agreeable.  LP ordered to be done at [facility]

## 2014-09-21 NOTE — Addendum Note (Signed)
Addended by: Candis Schatz I on: 09/21/2014 03:54 PM   Modules accepted: Orders

## 2014-09-29 ENCOUNTER — Encounter: Payer: Self-pay | Admitting: Neurology

## 2014-09-29 ENCOUNTER — Ambulatory Visit (INDEPENDENT_AMBULATORY_CARE_PROVIDER_SITE_OTHER): Payer: Medicare Other | Admitting: Neurology

## 2014-09-29 VITALS — BP 126/90 | HR 66 | Resp 14 | Ht 62.0 in | Wt 219.0 lb

## 2014-09-29 DIAGNOSIS — R202 Paresthesia of skin: Secondary | ICD-10-CM | POA: Insufficient documentation

## 2014-09-29 DIAGNOSIS — G932 Benign intracranial hypertension: Secondary | ICD-10-CM

## 2014-09-29 DIAGNOSIS — G5601 Carpal tunnel syndrome, right upper limb: Secondary | ICD-10-CM

## 2014-09-29 DIAGNOSIS — G43709 Chronic migraine without aura, not intractable, without status migrainosus: Secondary | ICD-10-CM | POA: Diagnosis not present

## 2014-09-29 DIAGNOSIS — M542 Cervicalgia: Secondary | ICD-10-CM

## 2014-09-29 DIAGNOSIS — G5600 Carpal tunnel syndrome, unspecified upper limb: Secondary | ICD-10-CM

## 2014-09-29 HISTORY — DX: Carpal tunnel syndrome, unspecified upper limb: G56.00

## 2014-09-29 MED ORDER — ALPRAZOLAM 1 MG PO TABS: 1.0000 mg | ORAL_TABLET | Freq: Three times a day (TID) | ORAL | Status: DC | PRN

## 2014-09-29 MED ORDER — OXYCODONE HCL 15 MG PO TABS: 15.0000 mg | ORAL_TABLET | Freq: Three times a day (TID) | ORAL | Status: DC | PRN

## 2014-09-29 NOTE — Progress Notes (Signed)
[facility]  PATIENT: Connie Arnold DOB: Apr 04, 1966     HISTORICAL  CHIEF COMPLAINT:  Chief Complaint  Patient presents with  . Parasthesias    Sts. she was seen in [facility] ED for tingling in face, right arm and bilat hands and fatigue.  Sts. she was told parasthesias are related to her migraines.  An lp for pressure, labs hsa been ordered, but Erlean sts. she has not been contacted to schedule lp./fim    HISTORY OF PRESENT ILLNESS:  She is a 49 year old woman who has had a long history of headaches who went to the emergency room recently due to tingling in the left face, right arm and both hands.  She has had some occasional tingling in the past but the intensity was much more that day. In the emergency room, she was told that the symptoms may be due to her migraines. She notes that the tingling in the right arm is worse than the left but she has not noted any association with activity. Although she feels better than she did a few days ago when she was in the emergency room, the paresthesias have not completely resolved. She is on topiramate but is on a stable dose of 400 mg for long time. We discussed that topiramate can lead to tingling. She notes some neck pain but she feels the level of neck pain is at her baseline. In the past, neck pain has exacerbated without any symptoms going into her arms.  She also has a history of elevated intracranial pressure on an initial lumbar puncture that was not seen on subsequent lumbar punctures. When the first lumbar puncture was performed her headaches had some improvement. MRI of the brain does show findings that would be consistent with elevated intracranial pressure. She has been scheduled for a lumbar puncture to determine opening pressure but it has not been performed yet.  ROS:  Out of a complete 14 system review of symptoms, the patient complains only of the following symptoms, and all other reviewed systems are  negative.  Fatigue, easy bruising, mild edema in legs, tinnitus, joint pain, headaches, numbness, insomnia with restless legs   ALLERGIES: Allergies  Allergen Reactions  . Codeine   . Imitrex [Sumatriptan] Nausea And Vomiting  . Nsaids Other (See Comments)    Kidney failure  . Ramipril     HOME MEDICATIONS:  Current outpatient prescriptions:  .  ALPRAZolam (XANAX) 1 MG tablet, Take 1 tablet (1 mg total) by mouth 3 (three) times daily as needed for anxiety., Disp: 90 tablet, Rfl: 3 .  baclofen (LIORESAL) 10 MG tablet, 1 or 2 pills po tid, Disp: 540 each, Rfl: 3 .  furosemide (LASIX) 40 MG tablet, , Disp: , Rfl:  .  hydroxychloroquine (PLAQUENIL) 200 MG tablet, Take 1 tablet (200 mg total) by mouth 2 (two) times daily., Disp: 30 tablet, Rfl: 0 .  Montelukast Sodium (SINGULAIR PO), Take by mouth., Disp: , Rfl:  .  oxyCODONE (ROXICODONE) 15 MG immediate release tablet, Take 1 tablet (15 mg total) by mouth every 8 (eight) hours as needed for pain. Fill After 08/28/2014, Disp: 90 tablet, Rfl: 0 .  sitaGLIPtin (JANUVIA) 100 MG tablet, Take 100 mg by mouth daily., Disp: , Rfl:  .  topiramate (TOPAMAX) 200 MG tablet, Take 1 tablet (200 mg total) by mouth 2 (two) times daily., Disp: 180 tablet, Rfl: 3 .  azithromycin (ZITHROMAX) 250 MG tablet, , Disp: , Rfl:  .  diltiazem (MATZIM  LA) 180 MG 24 hr tablet, Take 180 mg by mouth daily., Disp: , Rfl:  .  lactulose, encephalopathy, (CHRONULAC) 10 GM/15ML SOLN, , Disp: , Rfl:  .  pantoprazole (PROTONIX) 40 MG tablet, , Disp: , Rfl:  .  traZODone (DESYREL) 50 MG tablet, , Disp: , Rfl:  .  trimethoprim (TRIMPEX) 100 MG tablet, , Disp: , Rfl:   PAST MEDICAL HISTORY: Past Medical History  Diagnosis Date  . Asthma   . Lupus   . Fibromyalgia   . Migraines   . Hypertension   . IBS (irritable bowel syndrome)   . Interstitial cystitis   . Diabetes mellitus without complication   . Lupus (systemic lupus erythematosus)   . Pseudotumor cerebri   .  Chronic kidney disease   . Chronic back pain     PAST SURGICAL HISTORY: Past Surgical History  Procedure Laterality Date  . Abdominal hysterectomy    . Cholecystectomy    . Appendectomy    . Cystoscopy      FAMILY HISTORY: Family History  Problem Relation Age of Onset  . Graves' disease Mother   . Heart disease Father   . Prostate cancer Father   . Healthy Brother   . Healthy Maternal Aunt   . Healthy Maternal Uncle     SOCIAL HISTORY:  History   Social History  . Marital Status: Married    Spouse Name: N/A  . Number of Children: N/A  . Years of Education: N/A   Occupational History  . Not on file.   Social History Main Topics  . Smoking status: Never Smoker   . Smokeless tobacco: Not on file  . Alcohol Use: No  . Drug Use: No  . Sexual Activity: Not on file   Other Topics Concern  . Not on file   Social History Narrative     PHYSICAL EXAM  Filed Vitals:   09/29/14 0953  BP: 126/90  Pulse: 66  Resp: 14  Height:  (1.575 m)  Weight: 219 lb (99.338 kg)    Body mass index is 40.05 kg/(m^2).   General: The patient is well-developed and well-nourished and in no acute distress  Neck: The neck is supple, no carotid bruits are noted.  The neck is mildly tender.  Skin: Extremities are without significant edema.  Musculoskeletal:  Back is nontender  Neurologic Exam  Mental status: The patient is alert and oriented x 3 at the time of the examination. The patient has apparent normal recent and remote memory, with an apparently normal attention span and concentration ability.   Speech is normal.  Cranial nerves: Extraocular movements are full.   Visual fields are full.  Facial symmetry is present. There is good facial sensation to soft touch bilaterally.Facial strength is normal.  Trapezius and sternocleidomastoid strength is normal. No dysarthria is noted.  The tongue is midline, and the patient has symmetric elevation of the soft palate.    Motor:  Muscle bulk is normal.   Tone is normal. Strength is  5 / 5 in all 4 extremities except 4+/5 in the right APB (median).   Sensory: Sensory testing shows decreased sensation to touch and temperature and vibration in the right arm relative to the left arm. There was decreased sensation in the thenar eminence on the right compared to the hyperthenar eminence.  Coordination: Cerebellar testing reveals good finger-nose-finger bilaterally.  Gait and station: Station is normal.   Gait is normal. Tandem gait is normal. Romberg is negative.  Reflexes: Deep tendon reflexes are symmetric and normal bilaterally.     DIAGNOSTIC DATA (LABS, IMAGING, TESTING) - I reviewed patient records, labs, notes, testing and imaging myself where available.  Lab Results  Component Value Date   WBC 9.8 09/14/2014   HGB 13.5 09/14/2014   HCT 40.8 09/14/2014   MCV 90.1 09/14/2014   PLT 257 09/14/2014        ASSESSMENT AND PLAN  Chronic migraine without aura without status migrainosus, not intractable  Idiopathic intracranial hypertension  Neck pain  Tingling  Carpal tunnel syndrome of right wrist   1.   She will follow through with the lumbar puncture to determine opening pressure. If elevated, I will also check visual field testing and consider a change from topiramate to acetazolamide. Ultimately, if symptoms persist she may require VP shunting or optic nerve fenestration. 2.  Advised to obtain and wear a wrist splint for the right arm while inactive and 3,.  Medications were refilled. She will return to see [place] in 3 months or sooner if she has new or worsening neurologic symptoms.   Richard A. Epimenio Foot, MD, PhD 09/29/2014, 10:10 AM Certified in Neurology, Clinical Neurophysiology, Sleep Medicine, Pain Medicine and Neuroimaging  [facility] [address], [address] [place], [place] [address] [phone]

## 2014-09-30 ENCOUNTER — Other Ambulatory Visit: Payer: Self-pay | Admitting: Neurology

## 2014-09-30 DIAGNOSIS — G43709 Chronic migraine without aura, not intractable, without status migrainosus: Secondary | ICD-10-CM

## 2014-10-05 ENCOUNTER — Other Ambulatory Visit: Payer: Self-pay | Admitting: *Deleted

## 2014-10-05 DIAGNOSIS — G35 Multiple sclerosis: Secondary | ICD-10-CM

## 2014-10-11 ENCOUNTER — Other Ambulatory Visit: Payer: Self-pay | Admitting: *Deleted

## 2014-10-11 DIAGNOSIS — G35 Multiple sclerosis: Secondary | ICD-10-CM

## 2014-10-18 ENCOUNTER — Ambulatory Visit
Admission: RE | Admit: 2014-10-18 | Discharge: 2014-10-18 | Disposition: A | Discharge status: Home / Self Care | Payer: Medicare Other | Source: Ambulatory Visit | Attending: Neurology | Admitting: Neurology

## 2014-10-18 ENCOUNTER — Other Ambulatory Visit: Payer: Self-pay | Admitting: Neurology

## 2014-10-18 VITALS — BP 107/54 | HR 57

## 2014-10-18 DIAGNOSIS — R202 Paresthesia of skin: Secondary | ICD-10-CM

## 2014-10-18 DIAGNOSIS — G43709 Chronic migraine without aura, not intractable, without status migrainosus: Secondary | ICD-10-CM

## 2014-10-18 LAB — CSF CELL COUNT WITH DIFFERENTIAL
RBC COUNT CSF: 31 uL — AB
Tube #: 3
WBC, CSF: 1 cu mm (ref 0–5)

## 2014-10-18 LAB — PROTEIN, CSF: Total Protein, CSF: 37 mg/dL (ref 15–45)

## 2014-10-18 LAB — GLUCOSE, CSF: Glucose, CSF: 54 mg/dL (ref 43–76)

## 2014-10-18 NOTE — Discharge Instructions (Signed)

## 2014-10-19 ENCOUNTER — Encounter: Payer: Self-pay | Admitting: Neurology

## 2014-10-19 ENCOUNTER — Telehealth: Payer: Self-pay | Admitting: Neurology

## 2014-10-19 ENCOUNTER — Ambulatory Visit (INDEPENDENT_AMBULATORY_CARE_PROVIDER_SITE_OTHER): Payer: Medicare Other | Admitting: Neurology

## 2014-10-19 VITALS — BP 146/94 | HR 80 | Resp 16 | Ht 62.0 in | Wt 220.0 lb

## 2014-10-19 DIAGNOSIS — G43709 Chronic migraine without aura, not intractable, without status migrainosus: Secondary | ICD-10-CM | POA: Diagnosis not present

## 2014-10-19 DIAGNOSIS — M542 Cervicalgia: Secondary | ICD-10-CM

## 2014-10-19 NOTE — Telephone Encounter (Signed)
Patients son called and stated that the patient is in extreme pain and is not able to get up. Please call and advise. [phone])

## 2014-10-19 NOTE — Telephone Encounter (Signed)
I have spoken with Connie Arnold this morning.  She had lp done at [facility] c/o onset of severe neck pain/stiffness after lp. Sts. she is not able to hold her head up.  Denies fever.  Appt. given 11am this morning/fim

## 2014-10-19 NOTE — Telephone Encounter (Signed)
Patient called stating that she is not feeling well and her neck is stiff from her lumbar puncture yesterday. Please call and advise. Patient can be reached [phone]

## 2014-10-19 NOTE — Progress Notes (Signed)
[facility]  PATIENT: Connie Arnold DOB: 03/06/1966     HISTORICAL  CHIEF COMPLAINT:  Chief Complaint  Patient presents with  . Headache    She c/o h/a, neck pain s/p lp yesterday.  Sts. h/a is better with lying down, worse with sitting or standing position./fim  . Neck Pain    HISTORY OF PRESENT ILLNESS:  She is a 49 year old woman who has had a long history of chronic migraine headaches and was diagnosed with pseudotumor cerebri in the past. She underwent a lumbar puncture yesterday to make sure that her worsening headaches were not due to a return of elevated intracranial pressures lumbar puncture showed normal opening pressure. She reports that the lumbar puncture was difficult informed by the report. At first she was placed on her side and when entry the appropriate interspace was not possible she was placed on her stomach. Regardless, afterwards she noted a headache that is predominantly in the neck. Worsens the pain is in the occipital region. Pain is worse when she is upright or at however, it does not go away completely when she is laying down she notes bright lights bother her. Moving her head increases the pain.   ALLERGIES: Allergies  Allergen Reactions  . Nsaids Other (See Comments)    Chronic kidney failure  . Codeine   . Ramipril   . Imitrex [Sumatriptan] Nausea And Vomiting    HOME MEDICATIONS:  Current outpatient prescriptions:  .  ALPRAZolam (XANAX) 1 MG tablet, Take 1 tablet (1 mg total) by mouth 3 (three) times daily as needed for anxiety., Disp: 90 tablet, Rfl: 3 .  azithromycin (ZITHROMAX) 250 MG tablet, , Disp: , Rfl:  .  baclofen (LIORESAL) 10 MG tablet, 1 or 2 pills po tid, Disp: 540 each, Rfl: 3 .  diltiazem (MATZIM LA) 180 MG 24 hr tablet, Take 180 mg by mouth daily., Disp: , Rfl:  .  furosemide (LASIX) 40 MG tablet, , Disp: , Rfl:  .  hydroxychloroquine (PLAQUENIL) 200 MG tablet, Take 1 tablet (200 mg total) by mouth 2 (two)  times daily., Disp: 30 tablet, Rfl: 0 .  lactulose, encephalopathy, (CHRONULAC) 10 GM/15ML SOLN, , Disp: , Rfl:  .  Montelukast Sodium (SINGULAIR PO), Take by mouth., Disp: , Rfl:  .  oxyCODONE (ROXICODONE) 15 MG immediate release tablet, Take 1 tablet (15 mg total) by mouth every 8 (eight) hours as needed for pain. Fill After 08/28/2014, Disp: 90 tablet, Rfl: 0 .  pantoprazole (PROTONIX) 40 MG tablet, , Disp: , Rfl:  .  sitaGLIPtin (JANUVIA) 100 MG tablet, Take 100 mg by mouth daily., Disp: , Rfl:  .  topiramate (TOPAMAX) 200 MG tablet, Take 1 tablet (200 mg total) by mouth 2 (two) times daily., Disp: 180 tablet, Rfl: 3 .  traZODone (DESYREL) 50 MG tablet, , Disp: , Rfl:  .  trimethoprim (TRIMPEX) 100 MG tablet, , Disp: , Rfl:   PAST MEDICAL HISTORY: Past Medical History  Diagnosis Date  . Asthma   . Lupus   . Fibromyalgia   . Migraines   . Hypertension   . IBS (irritable bowel syndrome)   . Interstitial cystitis   . Diabetes mellitus without complication   . Lupus (systemic lupus erythematosus)   . Pseudotumor cerebri   . Chronic kidney disease   . Chronic back pain   . Carpal tunnel syndrome 09/29/2014    PAST SURGICAL HISTORY: Past Surgical History  Procedure Laterality Date  . Abdominal hysterectomy    .  Cholecystectomy    . Appendectomy    . Cystoscopy      FAMILY HISTORY: Family History  Problem Relation Age of Onset  . Graves' disease Mother   . Heart disease Father   . Prostate cancer Father   . Healthy Brother   . Healthy Maternal Aunt   . Healthy Maternal Uncle     SOCIAL HISTORY:  History   Social History  . Marital Status: Married    Spouse Name: N/A  . Number of Children: N/A  . Years of Education: N/A   Occupational History  . Not on file.   Social History Main Topics  . Smoking status: Never Smoker   . Smokeless tobacco: Not on file  . Alcohol Use: No  . Drug Use: No  . Sexual Activity: Not on file   Other Topics Concern  . Not on  file   Social History Narrative     PHYSICAL EXAM  Filed Vitals:   10/19/14 1121  BP: 146/94  Pulse: 80  Resp: 16  Height:  (1.575 m)  Weight: 220 lb (99.791 kg)    Body mass index is 40.23 kg/(m^2).   General: The patient is an obese woman in mild distress, laying on her back on the exam table wearing sunglasses.    Eyes:   Funduscopic examination shows definite papilledema though there does appear to be some temporal blurring of the optic disc.  Neck: The neck is supple, no carotid bruits are noted.  The neck is tender at the occiput.    Neurologic Exam  Mental status: The patient is alert and oriented x 3 at the time of the examination. The patient has apparent normal recent and remote memory, with an apparently normal attention span and concentration ability.   Speech is normal.  Cranial nerves: Extraocular movements are full.   PERRLA.  Facial symmetry is present. There is good facial sensation to soft touch bilaterally.Facial strength is normal.  Trapezius and sternocleidomastoid strength is normal. No dysarthria is noted.    Motor:  Muscle bulk is normal.   Tone is normal. Strength is  5 / 5 in limbs  Sensory: Sensory testing shows symmetric touch sensation proximally in limbs  Gait and station: Station is normal.   Gait is normal.  Reflexes: Deep tendon reflexes are symmetric and normal bilaterally.     DIAGNOSTIC DATA (LABS, IMAGING, TESTING) - I reviewed patient records, labs, notes, testing and imaging myself where available.  Lab Results  Component Value Date   WBC 9.8 09/14/2014   HGB 13.5 09/14/2014   HCT 40.8 09/14/2014   MCV 90.1 09/14/2014   PLT 257 09/14/2014        ASSESSMENT AND PLAN  Neck pain  Chronic migraine without aura without status migrainosus, not intractable    1.   Using sterile technique, trigger point injections with 80 mg Depo-Medrol in Marcaine of bilateral splenius capitis muscles, bilateral C6-C7 and bilateral  trapezius muscles. She tolerated the procedure well. 2.   If this does not help, and headaches persist with a positional element, we would need to consider an epidural blood patch 3.   If better, she will return to see [place] in 3 months or call sooner if she is no better or has new or worsening neurologic symptoms.   Richard A. Epimenio Foot, MD, PhD 10/19/2014, 11:40 AM Certified in Neurology, Clinical Neurophysiology, Sleep Medicine, Pain Medicine and Neuroimaging  [facility] [address] [place], [place]  [address] [phone] 

## 2014-10-21 ENCOUNTER — Ambulatory Visit
Admission: RE | Admit: 2014-10-21 | Discharge: 2014-10-21 | Disposition: A | Discharge status: Home / Self Care | Payer: Medicare Other | Source: Ambulatory Visit | Attending: Neurology | Admitting: Neurology

## 2014-10-21 DIAGNOSIS — G43709 Chronic migraine without aura, not intractable, without status migrainosus: Secondary | ICD-10-CM

## 2014-10-21 NOTE — Discharge Instructions (Addendum)

## 2014-10-21 NOTE — Telephone Encounter (Signed)
Patient called back and stated that she is feeling much worse. She has requested to speak with the nurse regarding this. She stated that her and Dr. Epimenio Foot discussed the possibility of her going back to [facility] for a blood patch if her symptoms did not improve. Patient describes hearing "a horn in her head", headache, back pain, shortness of breath. Please call and advise.

## 2014-10-21 NOTE — Progress Notes (Signed)
1358 blood drawn from left [facility] for blood patch. About 18 cc of blood obtained, site is unremarkable and pt tolerated well.  Discharge instructions explained to pt.

## 2014-10-21 NOTE — Addendum Note (Signed)
Addended by: Candis Schatz I on: 10/21/2014 09:59 AM   Modules accepted: Orders

## 2014-10-21 NOTE — Telephone Encounter (Signed)
I have spoken with Mollye this morning.  She sts. h/a is worse today, still better with lying down and worse with standing.  Per RAS, ok for blood patch.  I have ordered this to be done at [facility].  I have also lmom for Danielle there that Lajla is requesting bd. patch be done today if possible.  I have given Lelon Huh phone # [phone]), so if she has not heard from them w/i the next couple of hrs, she can call them./fim

## 2014-10-23 ENCOUNTER — Telehealth: Payer: Self-pay | Admitting: Neurology

## 2014-10-23 MED ORDER — TIZANIDINE HCL 4 MG PO TABS: 4.0000 mg | ORAL_TABLET | Freq: Three times a day (TID) | ORAL | Status: DC

## 2014-10-23 NOTE — Telephone Encounter (Signed)
Despite blood patch, she continues to report a lot of neck pain and headache with a positional component.  Neck is tight.    In past no benefit from Flexeril.   Will add tizanidine as muscle relaxant.

## 2014-10-24 ENCOUNTER — Ambulatory Visit (INDEPENDENT_AMBULATORY_CARE_PROVIDER_SITE_OTHER): Payer: Self-pay | Admitting: *Deleted

## 2014-10-24 ENCOUNTER — Encounter: Payer: Self-pay | Admitting: Neurology

## 2014-10-24 ENCOUNTER — Ambulatory Visit (INDEPENDENT_AMBULATORY_CARE_PROVIDER_SITE_OTHER): Payer: Medicare Other | Admitting: Neurology

## 2014-10-24 VITALS — BP 122/78 | HR 76 | Resp 16 | Ht 62.0 in | Wt 220.0 lb

## 2014-10-24 DIAGNOSIS — G43019 Migraine without aura, intractable, without status migrainosus: Secondary | ICD-10-CM | POA: Insufficient documentation

## 2014-10-24 DIAGNOSIS — Z0289 Encounter for other administrative examinations: Secondary | ICD-10-CM

## 2014-10-24 DIAGNOSIS — M542 Cervicalgia: Secondary | ICD-10-CM | POA: Diagnosis not present

## 2014-10-24 DIAGNOSIS — G43709 Chronic migraine without aura, not intractable, without status migrainosus: Secondary | ICD-10-CM | POA: Diagnosis not present

## 2014-10-24 DIAGNOSIS — G4489 Other headache syndrome: Secondary | ICD-10-CM

## 2014-10-24 MED ORDER — KETOROLAC TROMETHAMINE 30 MG/ML IJ SOLN
30.0000 mg | Freq: Once | INTRAMUSCULAR | Status: AC
  Administered 2014-10-24: 30 mg via INTRAMUSCULAR

## 2014-10-24 MED ORDER — PROMETHAZINE HCL 25 MG/ML IJ SOLN
50.0000 mg | Freq: Once | INTRAMUSCULAR | Status: AC
  Administered 2014-10-24: 50 mg via INTRAMUSCULAR

## 2014-10-24 MED ORDER — OXYCODONE HCL 15 MG PO TABS: 15.0000 mg | ORAL_TABLET | Freq: Three times a day (TID) | ORAL | Status: DC | PRN

## 2014-10-24 NOTE — Progress Notes (Signed)
[facility]  PATIENT: Connie Arnold DOB: 1966-03-17     HISTORICAL  CHIEF COMPLAINT:  Chief Complaint  Patient presents with  . Headache    Sts. h/a is minimally better, but has continued despite blood patch and taking Tizanidine that Dr. Epimenio Foot called in for her over the weekend.  Sts. now she is also hearing a "roaring" in her  head--sounds like she's in a tunnel,  She is alert and oriented times 4, with skin warm, dry, pink.  Speech is deliberate and clear.  She is ambulatory with steady gait/fim    HISTORY OF PRESENT ILLNESS:  She is a 49 year old woman who has had a long history of chronic migraine headaches and was diagnosed with pseudotumor cerebri in the past. She underwent a lumbar puncture last week to make sure that her worsening headaches were not due to a return of elevated intracranial pressures.  The  lumbar puncture showed normal opening pressure --- therefore, she does NOT have idiopathic intracranial hypertension (pseudotumor cerebri) at this time.   Afterwards she noted a headache that is predominantly in the neck. .   When I saw her last week, she was fairly tender in the occiput and cervical spine paraspinal muscles and trigger point injection was performed. Unfortunately, she did not get much of a benefit (occiput better but rest of neck the same). Last Friday she underwent epidural blood patch as she was continuing to have a headache, worsened by being up for more than a couple minutes.  Moving her head increases the pain.  Pain is mostly in the neck and head.   She notes nausea, as well.  Pain is worse when she is upright or at however, it does not go away completely when she is laying down.  She notes bright lights bother her.    She has a pulsating rushing sound in her ears bilaterally.  She is taking oxycodone 15 mg po tid, alprazolam tid.  She also takes 30-50 mg baclofen and just started tizanidine.    She is on topiramate.     ------------ Chronic Headaches:    No current headache has been severe and more intractable, she has had chronic daily headache with moderate pain for most of the past couple of years   Even before her current positional headaches. She was having chronic migraine 30/30 days every month, lasting for more than 4 hours every day.  She has received some benefit from occipital nerve blocks, trigger point injections and medications but the headaches still persist on a daily basis for past couple years.    She had been on Botox for migraine prophylaxis in the past with benefit. However, with changes in insurance she was unable to continue due to very high co-pay. She is currently on Topamax 200 mg by mouth twice a day and muscle relaxants and opiates.  She has occasional emergency room visits and frequent additional visits to my office for trigger point injections..  She has tried and failed multiple prophylactic medications for her chronic migraine: Antiepileptics:    zonisamide, topiramate, Keppra.   (Due to obesity, Depakote is a poor choice) Anti-depressants  amitriptyline, imipramine Anti-hypertensives:  Diamox Muscle relaxants:  baclofen, tizanidine NSAID's:  She is unable to tolerate chronic anti-inflammatories due to mild renal insufficiency.   ALLERGIES: Allergies  Allergen Reactions  . Nsaids Other (See Comments)    Chronic kidney failure  . Codeine   . Ramipril   . Imitrex [Sumatriptan] Nausea And Vomiting  HOME MEDICATIONS:  Current outpatient prescriptions:  .  ALPRAZolam (XANAX) 1 MG tablet, Take 1 tablet (1 mg total) by mouth 3 (three) times daily as needed for anxiety., Disp: 90 tablet, Rfl: 3 .  azithromycin (ZITHROMAX) 250 MG tablet, , Disp: , Rfl:  .  baclofen (LIORESAL) 10 MG tablet, 1 or 2 pills po tid, Disp: 540 each, Rfl: 3 .  diltiazem (MATZIM LA) 180 MG 24 hr tablet, Take 180 mg by mouth daily., Disp: , Rfl:  .  furosemide (LASIX) 40 MG tablet, , Disp: , Rfl:   .  hydroxychloroquine (PLAQUENIL) 200 MG tablet, Take 1 tablet (200 mg total) by mouth 2 (two) times daily., Disp: 30 tablet, Rfl: 0 .  lactulose, encephalopathy, (CHRONULAC) 10 GM/15ML SOLN, , Disp: , Rfl:  .  Montelukast Sodium (SINGULAIR PO), Take by mouth., Disp: , Rfl:  .  oxyCODONE (ROXICODONE) 15 MG immediate release tablet, Take 1 tablet (15 mg total) by mouth every 8 (eight) hours as needed for pain. Fill After 08/28/2014, Disp: 90 tablet, Rfl: 0 .  pantoprazole (PROTONIX) 40 MG tablet, , Disp: , Rfl:  .  sitaGLIPtin (JANUVIA) 100 MG tablet, Take 100 mg by mouth daily., Disp: , Rfl:  .  tiZANidine (ZANAFLEX) 4 MG tablet, Take 1 tablet (4 mg total) by mouth 3 (three) times daily., Disp: 90 tablet, Rfl: 2 .  topiramate (TOPAMAX) 200 MG tablet, Take 1 tablet (200 mg total) by mouth 2 (two) times daily., Disp: 180 tablet, Rfl: 3 .  traZODone (DESYREL) 50 MG tablet, , Disp: , Rfl:  .  trimethoprim (TRIMPEX) 100 MG tablet, , Disp: , Rfl:   PAST MEDICAL HISTORY: Past Medical History  Diagnosis Date  . Asthma   . Lupus   . Fibromyalgia   . Migraines   . Hypertension   . IBS (irritable bowel syndrome)   . Interstitial cystitis   . Diabetes mellitus without complication   . Lupus (systemic lupus erythematosus)   . Pseudotumor cerebri   . Chronic kidney disease   . Chronic back pain   . Carpal tunnel syndrome 09/29/2014    PAST SURGICAL HISTORY: Past Surgical History  Procedure Laterality Date  . Abdominal hysterectomy    . Cholecystectomy    . Appendectomy    . Cystoscopy      FAMILY HISTORY: Family History  Problem Relation Age of Onset  . Graves' disease Mother   . Heart disease Father   . Prostate cancer Father   . Healthy Brother   . Healthy Maternal Aunt   . Healthy Maternal Uncle     SOCIAL HISTORY:  History   Social History  . Marital Status: Married    Spouse Name: N/A  . Number of Children: N/A  . Years of Education: N/A   Occupational History  .  Not on file.   Social History Main Topics  . Smoking status: Never Smoker   . Smokeless tobacco: Not on file  . Alcohol Use: No  . Drug Use: No  . Sexual Activity: Not on file   Other Topics Concern  . Not on file   Social History Narrative     PHYSICAL EXAM  Filed Vitals:   10/24/14 1036  BP: 122/78  Pulse: 76  Resp: 16  Height:  (1.575 m)  Weight: 220 lb (99.791 kg)    Body mass index is 40.23 kg/(m^2).   General: The patient is an obese woman in mild distress, laying on her back on the  exam table wearing sunglasses.    Neck: The neck is supple.  The neck is tender at the lower cervical paraspinal, upper thoracic paraspinals, rhomboid muscles.      Neurologic Exam  Mental status: The patient is alert and oriented x 3 at the time of the examination. The patient has apparent normal recent and remote memory, with an apparently normal attention span and concentration ability.   Speech is normal.  Cranial nerves: Extraocular movements are full.     Facial symmetry is present.  Facial strength is normal.  Trapezius and sternocleidomastoid strength is normal. No dysarthria is noted.    Motor:  Muscle bulk is normal.   Tone is normal. Strength is  5 / 5 in limbs  Sensory: Sensory testing shows symmetric touch sensation proximally in limbs  Gait and station: Station is normal.   Gait is normal.   Reflexes: Deep tendon reflexes are symmetric and normal bilaterally.     DIAGNOSTIC DATA (LABS, IMAGING, TESTING) - I reviewed patient records, labs, notes, testing and imaging myself where available.  Lab Results  Component Value Date   WBC 9.8 09/14/2014   HGB 13.5 09/14/2014   HCT 40.8 09/14/2014   MCV 90.1 09/14/2014   PLT 257 09/14/2014        ASSESSMENT AND PLAN  Migraine without aura, intractable  Neck pain  Chronic migraine w/o aura w/o status migrainosus, not intractable   1.   Using sterile technique, trigger point injections with 6 cc  Marcaine of bilateral C6-C7, T1-T2 and rhomboids muscles were performed. She tolerated the procedure well. 2.  30 mg Toradol IM. 50 mg Phenergan IM.  3.   In the past, headaches have been much better controlled with Botox therapy but her insurance has not covered it without very high copay recently. I will try again to see we can get Botox coverage for her with better copay.   We may not be able to get her headaches under control without Botox.  If better, she will return to see [place] in 3 months or call sooner if she is no better or has new or worsening neurologic symptoms.   Paris Hohn A. Epimenio Foot, MD, PhD 10/24/2014, 10:42 AM Certified in Neurology, Clinical Neurophysiology, Sleep Medicine, Pain Medicine and Neuroimaging  [facility] [address], [address] [place], [place] [address] [phone]

## 2014-10-24 NOTE — Telephone Encounter (Signed)
I have spoken with Connie Arnold this morning.  She c/o continued neck pain and h/a, despite having had bd. patch, taking Tizanidine that RAS called in for her this weekend.  Per RAS, I have given her an appt. for 1020 this morning, to further investigate cause of pain/fim

## 2014-10-24 NOTE — Telephone Encounter (Signed)
Patient called again stating that she is still experiencing neck pain and headaches. The muscle relaxer is not helping. Please call and advise. Patient can be reached @ [phone]

## 2014-10-24 NOTE — Patient Instructions (Signed)
Toradol 30mg  given im luog and Phenergan 50mg  given im ruog.  Pt's husband is with her.  I have advised them both that phenergan will cause drowsiness, pt. is not to drive for the next 24 hours.  They are agreeable.  Pt. is d/c and is ambulatory out of office without difficulty, with husband.

## 2014-11-10 ENCOUNTER — Encounter: Payer: Self-pay | Admitting: Neurology

## 2014-11-10 ENCOUNTER — Ambulatory Visit (INDEPENDENT_AMBULATORY_CARE_PROVIDER_SITE_OTHER): Payer: Medicare Other | Admitting: Neurology

## 2014-11-10 VITALS — BP 110/72 | HR 64 | Resp 16 | Ht 62.0 in | Wt 227.0 lb

## 2014-11-10 DIAGNOSIS — R202 Paresthesia of skin: Secondary | ICD-10-CM

## 2014-11-10 DIAGNOSIS — G4700 Insomnia, unspecified: Secondary | ICD-10-CM | POA: Diagnosis not present

## 2014-11-10 DIAGNOSIS — F329 Major depressive disorder, single episode, unspecified: Secondary | ICD-10-CM | POA: Diagnosis not present

## 2014-11-10 DIAGNOSIS — G43709 Chronic migraine without aura, not intractable, without status migrainosus: Secondary | ICD-10-CM

## 2014-11-10 DIAGNOSIS — M542 Cervicalgia: Secondary | ICD-10-CM | POA: Diagnosis not present

## 2014-11-10 DIAGNOSIS — F32A Depression, unspecified: Secondary | ICD-10-CM

## 2014-11-10 MED ORDER — TRAZODONE HCL 100 MG PO TABS: 100.0000 mg | ORAL_TABLET | Freq: Every day | ORAL | Status: DC

## 2014-11-10 NOTE — Progress Notes (Signed)
[facility]  PATIENT: Connie Arnold DOB: 05-Jun-1965     HISTORICAL  CHIEF COMPLAINT:  Chief Complaint  Patient presents with  . Headache    Sts. she is still having daily h/a's, neck stiffness, but they are improved since tpi was given at last office visit.  Sts. she no longer has "roaring in my head."/fim    HISTORY OF PRESENT ILLNESS:  Connie Arnold is a 49 year old woman with chronic migraine headaches .   She is still experiencing chronic  migrainous headaches,     Currently, she has bilateral throbbing pressure like pain and a stiff painful neck (though better than last week).   She has photophobia, phonophobia and nausea/vomiting with headaches.    Moving intensifies the pain   She sometimes has lightheadedness upon standing.     She is still having right facial numbness (entire side of face).     She is sleeping poorly --- goes to sleep easily but wakes up a lot, often having trouble falling back asleep.   She is taking oxycodone 15 mg po tid, alprazolam tid.  She also takes 30-50 mg baclofen and just started tizanidine.    She is on topiramate 200 mg po bid (it helped at first, not sure if benefit now).  She feels more depressed with the worsening headaches and is very apathetic --- not leaving house much this month.   She is on prozac 40 mg  ------------ Chronic Migraine Hitory:   She has had chronic migraine headache with moderate pain, flaring up to severe,  for most of the past couple of years   She is having chronic migraine 30/30 days every month, lasting for more than 4 hours every day.  She has received some benefit from occipital nerve blocks, trigger point injections and medications but the headaches still persist on a daily basis for past couple years.    She had been on Botox for migraine prophylaxis in the past with benefit. However, with changes in insurance she was unable to continue due to very high co-pay. She is currently on Topamax 200  mg by mouth twice a day and muscle relaxants and opiates.  She has occasional emergency room visits and frequent additional visits to my office for trigger point injections..  She has tried and failed multiple prophylactic medications for her chronic migraine: Antiepileptics:    zonisamide, topiramate, Keppra.   (Due to obesity, Depakote is a poor choice) Anti-depressants  amitriptyline, imipramine Anti-hypertensives:  Diamox Muscle relaxants:  baclofen, tizanidine NSAID's:  She is unable to tolerate chronic anti-inflammatories due to mild renal insufficiency.  She has had multiple visits to ER and to our office for injections when pain is more severe.     ALLERGIES: Allergies  Allergen Reactions  . Nsaids Other (See Comments)    Chronic kidney failure  . Codeine   . Ramipril   . Imitrex [Sumatriptan] Nausea And Vomiting    HOME MEDICATIONS:  Current outpatient prescriptions:  .  ALPRAZolam (XANAX) 1 MG tablet, Take 1 tablet (1 mg total) by mouth 3 (three) times daily as needed for anxiety., Disp: 90 tablet, Rfl: 3 .  baclofen (LIORESAL) 10 MG tablet, 1 or 2 pills po tid, Disp: 540 each, Rfl: 3 .  diltiazem (MATZIM LA) 180 MG 24 hr tablet, Take 180 mg by mouth daily., Disp: , Rfl:  .  furosemide (LASIX) 40 MG tablet, , Disp: , Rfl:  .  hydroxychloroquine (PLAQUENIL) 200 MG tablet, Take 1  tablet (200 mg total) by mouth 2 (two) times daily., Disp: 30 tablet, Rfl: 0 .  lactulose, encephalopathy, (CHRONULAC) 10 GM/15ML SOLN, , Disp: , Rfl:  .  Montelukast Sodium (SINGULAIR PO), Take by mouth., Disp: , Rfl:  .  oxyCODONE (ROXICODONE) 15 MG immediate release tablet, Take 1 tablet (15 mg total) by mouth every 8 (eight) hours as needed for pain., Disp: 90 tablet, Rfl: 0 .  pantoprazole (PROTONIX) 40 MG tablet, , Disp: , Rfl:  .  sitaGLIPtin (JANUVIA) 100 MG tablet, Take 100 mg by mouth daily., Disp: , Rfl:  .  tiZANidine (ZANAFLEX) 4 MG tablet, Take 1 tablet (4 mg total) by mouth 3 (three)  times daily., Disp: 90 tablet, Rfl: 2 .  topiramate (TOPAMAX) 200 MG tablet, Take 1 tablet (200 mg total) by mouth 2 (two) times daily., Disp: 180 tablet, Rfl: 3 .  traZODone (DESYREL) 50 MG tablet, , Disp: , Rfl:  .  trimethoprim (TRIMPEX) 100 MG tablet, , Disp: , Rfl:   PAST MEDICAL HISTORY: Past Medical History  Diagnosis Date  . Asthma   . Lupus   . Fibromyalgia   . Migraines   . Hypertension   . IBS (irritable bowel syndrome)   . Interstitial cystitis   . Diabetes mellitus without complication   . Lupus (systemic lupus erythematosus)   . Pseudotumor cerebri   . Chronic kidney disease   . Chronic back pain   . Carpal tunnel syndrome 09/29/2014    PAST SURGICAL HISTORY: Past Surgical History  Procedure Laterality Date  . Abdominal hysterectomy    . Cholecystectomy    . Appendectomy    . Cystoscopy      FAMILY HISTORY: Family History  Problem Relation Age of Onset  . Graves' disease Mother   . Heart disease Father   . Prostate cancer Father   . Healthy Brother   . Healthy Maternal Aunt   . Healthy Maternal Uncle     SOCIAL HISTORY:  History   Social History  . Marital Status: Married    Spouse Name: N/A  . Number of Children: N/A  . Years of Education: N/A   Occupational History  . Not on file.   Social History Main Topics  . Smoking status: Never Smoker   . Smokeless tobacco: Not on file  . Alcohol Use: No  . Drug Use: No  . Sexual Activity: Not on file   Other Topics Concern  . Not on file   Social History Narrative     PHYSICAL EXAM  Filed Vitals:   11/10/14 0854  BP: 110/72  Pulse: 64  Resp: 16  Height: 5\' 2"  (1.575 m)  Weight: 227 lb (102.967 kg)    Body mass index is 41.51 kg/(m^2).   General: The patient is an obese woman in mild distress, laying on her back on the exam table wearing sunglasses.    Neck: The neck is supple.  The neck is tender at the lower cervical paraspinal, upper thoracic paraspinals, rhomboid muscles.       Neurologic Exam  Mental status: The patient is alert and oriented x 3 at the time of the examination. The patient has apparent normal recent and remote memory, with an apparently normal attention span and concentration ability.   Speech is normal.  Cranial nerves: Extraocular movements are full.     Facial symmetry is present.  Facial strength is normal.  Trapezius and sternocleidomastoid strength is normal. No dysarthria is noted.    Motor:  Muscle bulk is normal.   Tone is normal. Strength is  5 / 5 in limbs  Sensory: Sensory testing shows symmetric touch sensation proximally in limbs  Gait and station: Station is normal.   Gait is normal.   Reflexes: Deep tendon reflexes are symmetric and normal bilaterally.     DIAGNOSTIC DATA (LABS, IMAGING, TESTING) - I reviewed patient records, labs, notes, testing and imaging myself where available.  Lab Results  Component Value Date   WBC 9.8 09/14/2014   HGB 13.5 09/14/2014   HCT 40.8 09/14/2014   MCV 90.1 09/14/2014   PLT 257 09/14/2014        ASSESSMENT AND PLAN  Chronic migraine w/o aura w/o status migrainosus, not intractable - Plan: Chemo Denervation  Tingling  Neck pain  Insomnia  Depression   1.   I will see if she can get preauthorized for Botox for her chronic migraines. In the past, she did much better with Botox and this may help her to avoid frequent visits to the doctor and emergency room which are very costly to her and her insurance company.    2.  I filled out an FMLA form for her husband as he has needed to miss multiple days of work due to her frequent visits to our office, the emergency room and for procedures. 3.   She will continue current medications at this time.   I will increase her nighttime trazodone from 50 to 100 mg.  Return to see [place] in 3 months or call sooner if she is no better or has new or worsening neurologic symptoms.   Richard A. Epimenio Foot, MD, PhD 11/10/2014, 9:19 AM Certified in  Neurology, Clinical Neurophysiology, Sleep Medicine, Pain Medicine and Neuroimaging  [facility] [address], [address] [place], [place] [address] [phone]

## 2014-11-23 ENCOUNTER — Other Ambulatory Visit: Payer: Self-pay | Admitting: Neurology

## 2014-11-23 MED ORDER — OXYCODONE HCL 15 MG PO TABS: 15.0000 mg | ORAL_TABLET | Freq: Three times a day (TID) | ORAL | Status: DC | PRN

## 2014-11-23 NOTE — Telephone Encounter (Signed)
Patient called requesting refill for oxyCODONE (ROXICODONE) 15 MG immediate release tablet . Patient advised RX will be ready for pickup within 24 hours unless otherwise informed by RN.

## 2014-11-23 NOTE — Telephone Encounter (Signed)
Request entered, forwarded to provider for approval.  

## 2014-11-24 ENCOUNTER — Encounter: Payer: Self-pay | Admitting: *Deleted

## 2014-11-24 NOTE — Progress Notes (Signed)
Oxycodone rx. up front [facility]/fim 

## 2014-12-22 ENCOUNTER — Telehealth: Payer: Self-pay | Admitting: *Deleted

## 2014-12-22 ENCOUNTER — Other Ambulatory Visit: Payer: Self-pay | Admitting: Neurology

## 2014-12-22 MED ORDER — OXYCODONE HCL 15 MG PO TABS: 15.0000 mg | ORAL_TABLET | Freq: Three times a day (TID) | ORAL | Status: DC | PRN

## 2014-12-22 NOTE — Telephone Encounter (Signed)
Request entered, forwarded to provider for approval.  

## 2014-12-22 NOTE — Telephone Encounter (Signed)
Patient called requesting refill for oxyCODONE (ROXICODONE) 15 MG immediate release tablet . Patient advised RX will be ready within 24 hours unless informed otherwise by RN.

## 2014-12-22 NOTE — Telephone Encounter (Signed)
Spoke w/ pt to let her know Rx for oxycodone is ready to be picked up at our office. Also asked her to fill out DPR form while in the office and check-in ladies will print one for her. She verbalized understanding.  Rx placed up front.

## 2014-12-29 ENCOUNTER — Other Ambulatory Visit: Payer: Self-pay | Admitting: Neurology

## 2014-12-30 ENCOUNTER — Other Ambulatory Visit: Payer: Self-pay

## 2014-12-30 NOTE — Telephone Encounter (Signed)
Changed pharmacies  

## 2015-01-01 MED ORDER — ALPRAZOLAM 1 MG PO TABS: 1.0000 mg | ORAL_TABLET | Freq: Three times a day (TID) | ORAL | Status: DC | PRN

## 2015-01-02 NOTE — Telephone Encounter (Signed)
Rx signed and faxed.

## 2015-01-11 ENCOUNTER — Encounter: Payer: Self-pay | Admitting: Neurology

## 2015-01-11 ENCOUNTER — Ambulatory Visit (INDEPENDENT_AMBULATORY_CARE_PROVIDER_SITE_OTHER): Payer: Medicare Other | Admitting: Neurology

## 2015-01-11 VITALS — BP 118/76 | HR 78 | Resp 18 | Ht 62.0 in | Wt 231.8 lb

## 2015-01-11 DIAGNOSIS — F329 Major depressive disorder, single episode, unspecified: Secondary | ICD-10-CM | POA: Diagnosis not present

## 2015-01-11 DIAGNOSIS — M542 Cervicalgia: Secondary | ICD-10-CM | POA: Diagnosis not present

## 2015-01-11 DIAGNOSIS — G43709 Chronic migraine without aura, not intractable, without status migrainosus: Secondary | ICD-10-CM | POA: Diagnosis not present

## 2015-01-11 DIAGNOSIS — M5010 Cervical disc disorder with radiculopathy, unspecified cervical region: Secondary | ICD-10-CM | POA: Diagnosis not present

## 2015-01-11 DIAGNOSIS — R202 Paresthesia of skin: Secondary | ICD-10-CM

## 2015-01-11 DIAGNOSIS — F32A Depression, unspecified: Secondary | ICD-10-CM

## 2015-01-11 DIAGNOSIS — G4700 Insomnia, unspecified: Secondary | ICD-10-CM | POA: Diagnosis not present

## 2015-01-11 MED ORDER — ALPRAZOLAM 1 MG PO TABS: 1.0000 mg | ORAL_TABLET | Freq: Three times a day (TID) | ORAL | Status: DC | PRN

## 2015-01-11 MED ORDER — DULOXETINE HCL 60 MG PO CPEP: 60.0000 mg | ORAL_CAPSULE | Freq: Every day | ORAL | Status: DC

## 2015-01-11 MED ORDER — OXYCODONE HCL 15 MG PO TABS: 15.0000 mg | ORAL_TABLET | Freq: Three times a day (TID) | ORAL | Status: DC | PRN

## 2015-01-11 NOTE — Progress Notes (Signed)
[facility]  PATIENT: Connie Arnold DOB: Oct 07, 1965     HISTORICAL  CHIEF COMPLAINT:  Chief Complaint  Patient presents with  . Headaches    Sts. h/a's are more frequent and more severe.  Sts. head feels "like it's squeezing together."  Today she has new c/o right arm and leg pain, and numbness in her right 3rd, 4th and 5th fingers, also numbness in right 3rd, 4th and 5th toes.  Sts. she feels very fatigued/fim    HISTORY OF PRESENT ILLNESS:  Connie Arnold is a 49 year old woman with chronic migraine headaches and other neurologic complaints. .   Currently, she reports bad headache, neck pain and painful numbness in the right arm  She is still experiencing chronic headaches,     Currently, she has bilateral throbbing pressure like pain and a stiff painful neck (though better than last week).   She has photophobia, phonophobia and nausea with headaches.  No vomiting.      Moving intensifies the pain.  She had a CT earlier this week and was told there was no bleeding.  Pain in the neck also radiates into the right arm and she feels the pain radiates into her right leg when more severe.   The tingling is not painful in the hand (more numb) but there is more pain in the arm.   When present in the leg, she feels there is loss of sensation with tingling in the toes.      She is sleeping a lot more.   She feels she spends all day in bed or watching TV.    She is taking 30 mg daily of the baclofen and tid oxycodone 15 mg po, alprazolam tid.  She is also on tizanidine prn.     She is on topiramate 200 mg po bid (it helped at first, not sure if benefit now).  She feels more frustrated and depressed with the worsening headaches and is very apathetic.   She is on prozac 40 mg.     ------------ Chronic Migraine History:   She has had chronic migraine headache with moderate pain, flaring up to severe,  for most of the past couple of years   She is having chronic migraine 30/30  days every month, lasting for more than 4 hours every day.  She has received some benefit from occipital nerve blocks, trigger point injections and medications but the headaches still persist on a daily basis for past couple years.    She had been on Botox for migraine prophylaxis in the past with benefit. However, with changes in insurance she was unable to continue due to very high co-pay. She is currently on Topamax 200 mg by mouth twice a day and muscle relaxants and opiates.  She has occasional emergency room visits and frequent additional visits to my office for trigger point injections..  She has tried and failed multiple prophylactic medications for her chronic migraine: Antiepileptics:    zonisamide, topiramate, Keppra.   (Due to obesity, Depakote is a poor choice) Anti-depressants  amitriptyline, imipramine Anti-hypertensives:  Diamox Muscle relaxants:  baclofen, tizanidine NSAID's:  She is unable to tolerate chronic anti-inflammatories due to mild renal insufficiency.  She has had multiple visits to ER and to our office for injections when pain is more severe.     ALLERGIES: Allergies  Allergen Reactions  . Nsaids Other (See Comments)    Chronic kidney failure  . Codeine   . Ramipril   . Imitrex [  Sumatriptan] Nausea And Vomiting    HOME MEDICATIONS:  Current outpatient prescriptions:  .  ALPRAZolam (XANAX) 1 MG tablet, Take 1 tablet (1 mg total) by mouth 3 (three) times daily as needed for anxiety., Disp: 90 tablet, Rfl: 0 .  baclofen (LIORESAL) 10 MG tablet, 1 or 2 pills po tid, Disp: 540 each, Rfl: 3 .  diltiazem (MATZIM LA) 180 MG 24 hr tablet, Take 180 mg by mouth daily., Disp: , Rfl:  .  FLUoxetine (PROZAC) 40 MG capsule, Take 40 mg by mouth daily., Disp: , Rfl:  .  furosemide (LASIX) 40 MG tablet, , Disp: , Rfl:  .  hydroxychloroquine (PLAQUENIL) 200 MG tablet, Take 1 tablet (200 mg total) by mouth 2 (two) times daily., Disp: 30 tablet, Rfl: 0 .  lactulose,  encephalopathy, (CHRONULAC) 10 GM/15ML SOLN, , Disp: , Rfl:  .  Montelukast Sodium (SINGULAIR PO), Take by mouth., Disp: , Rfl:  .  oxyCODONE (ROXICODONE) 15 MG immediate release tablet, Take 1 tablet (15 mg total) by mouth every 8 (eight) hours as needed for pain., Disp: 90 tablet, Rfl: 0 .  pantoprazole (PROTONIX) 40 MG tablet, , Disp: , Rfl:  .  sitaGLIPtin (JANUVIA) 100 MG tablet, Take 100 mg by mouth daily., Disp: , Rfl:  .  tiZANidine (ZANAFLEX) 4 MG tablet, Take 1 tablet (4 mg total) by mouth 3 (three) times daily., Disp: 90 tablet, Rfl: 2 .  topiramate (TOPAMAX) 200 MG tablet, Take 1 tablet (200 mg total) by mouth 2 (two) times daily., Disp: 180 tablet, Rfl: 3 .  traZODone (DESYREL) 100 MG tablet, Take 1 tablet (100 mg total) by mouth at bedtime., Disp: 90 tablet, Rfl: 3 .  trimethoprim (TRIMPEX) 100 MG tablet, , Disp: , Rfl:   PAST MEDICAL HISTORY: Past Medical History  Diagnosis Date  . Asthma   . Lupus   . Fibromyalgia   . Migraines   . Hypertension   . IBS (irritable bowel syndrome)   . Interstitial cystitis   . Diabetes mellitus without complication   . Lupus (systemic lupus erythematosus)   . Pseudotumor cerebri   . Chronic kidney disease   . Chronic back pain   . Carpal tunnel syndrome 09/29/2014    PAST SURGICAL HISTORY: Past Surgical History  Procedure Laterality Date  . Abdominal hysterectomy    . Cholecystectomy    . Appendectomy    . Cystoscopy      FAMILY HISTORY: Family History  Problem Relation Age of Onset  . Graves' disease Mother   . Heart disease Father   . Prostate cancer Father   . Healthy Brother   . Healthy Maternal Aunt   . Healthy Maternal Uncle     SOCIAL HISTORY:  Social History   Social History  . Marital Status: Married    Spouse Name: N/A  . Number of Children: N/A  . Years of Education: N/A   Occupational History  . Not on file.   Social History Main Topics  . Smoking status: Never Smoker   . Smokeless tobacco: Not  on file  . Alcohol Use: No  . Drug Use: No  . Sexual Activity: Not on file   Other Topics Concern  . Not on file   Social History Narrative     PHYSICAL EXAM  Filed Vitals:   01/11/15 0947  BP: 118/76  Pulse: 78  Resp: 18  Height: _0  (1.575 m)  Weight: 231 lb 12.8 oz (105.144 kg)    Body mass  index is 42.39 kg/(m^2).   General: The patient is an obese woman in mild distress, laying on her back on the exam table wearing sunglasses.    Neck: The neck is supple.  The neck is tender at the lower cervical paraspinal, upper thoracic paraspinals, rhomboid muscles.    all right worse than left  Neurologic Exam  Mental status: The patient is alert and oriented x 3 at the time of the examination. The patient has apparent normal recent and remote memory, with an apparently normal attention span and concentration ability.   Speech is normal.  Cranial nerves: Extraocular movements are full.     Facial symmetry is present.  Facial strength is normal.  Trapezius and sternocleidomastoid strength is normal. No dysarthria is noted.    Motor:  Muscle bulk is normal.   Tone is normal. Strength is  5 / 5 in limbs  Sensory: Sensory testing shows symmetric touch sensation proximally in limbs bur she reports reduced sensation in right hand  Gait and station: Station is normal.   Gait is normal.   Reflexes: Deep tendon reflexes are symmetric and normal bilaterally.     DIAGNOSTIC DATA (LABS, IMAGING, TESTING) - I reviewed patient records, labs, notes, testing and imaging myself where available.  Lab Results  Component Value Date   WBC 9.8 09/14/2014   HGB 13.5 09/14/2014   HCT 40.8 09/14/2014   MCV 90.1 09/14/2014   PLT 257 09/14/2014        ASSESSMENT AND PLAN  Chronic migraine w/o aura w/o status migrainosus, not intractable  Cervical disc disorder with radiculopathy of cervical region  Depression  Insomnia  Neck pain  Tingling   1.   I will see if she can get  preauthorized for Botox for her chronic migraines since she may have met deductible. In the past, she did much better with Botox and this may help her to avoid frequent visits to the doctor and emergency room which are very costly to her and her insurance company.    2. Change prozac to duloxetine... May help mood better.   Stop tizanidin as it may make her less sleepy on intermittent basis 3.   She will continue current medications at this time.     Return to see [place] in 3 months or call sooner if she is no better or has new or worsening neurologic symptoms.   Pernie Grosso A. Felecia Shelling, MD, PhD 8/59/0931, 12:16 AM Certified in Neurology, Clinical Neurophysiology, Sleep Medicine, Pain Medicine and Neuroimaging  [facility] [address] [place], [place] [address] [phone]

## 2015-02-19 ENCOUNTER — Emergency Department (HOSPITAL_BASED_OUTPATIENT_CLINIC_OR_DEPARTMENT_OTHER)
Admission: EM | Admit: 2015-02-19 | Discharge: 2015-02-19 | Disposition: A | Discharge status: Home / Self Care | Payer: Medicare Other | Attending: Emergency Medicine | Admitting: Emergency Medicine

## 2015-02-19 ENCOUNTER — Encounter (HOSPITAL_BASED_OUTPATIENT_CLINIC_OR_DEPARTMENT_OTHER): Payer: Self-pay | Admitting: *Deleted

## 2015-02-19 DIAGNOSIS — I10 Essential (primary) hypertension: Secondary | ICD-10-CM | POA: Insufficient documentation

## 2015-02-19 DIAGNOSIS — J45909 Unspecified asthma, uncomplicated: Secondary | ICD-10-CM | POA: Insufficient documentation

## 2015-02-19 DIAGNOSIS — Z87448 Personal history of other diseases of urinary system: Secondary | ICD-10-CM | POA: Diagnosis not present

## 2015-02-19 DIAGNOSIS — G8929 Other chronic pain: Secondary | ICD-10-CM | POA: Diagnosis not present

## 2015-02-19 DIAGNOSIS — Z79899 Other long term (current) drug therapy: Secondary | ICD-10-CM | POA: Insufficient documentation

## 2015-02-19 DIAGNOSIS — E860 Dehydration: Secondary | ICD-10-CM | POA: Diagnosis not present

## 2015-02-19 DIAGNOSIS — R197 Diarrhea, unspecified: Secondary | ICD-10-CM | POA: Diagnosis not present

## 2015-02-19 DIAGNOSIS — R5383 Other fatigue: Secondary | ICD-10-CM | POA: Diagnosis present

## 2015-02-19 DIAGNOSIS — Z8719 Personal history of other diseases of the digestive system: Secondary | ICD-10-CM | POA: Diagnosis not present

## 2015-02-19 DIAGNOSIS — Z8739 Personal history of other diseases of the musculoskeletal system and connective tissue: Secondary | ICD-10-CM | POA: Diagnosis not present

## 2015-02-19 LAB — CBC WITH DIFFERENTIAL/PLATELET
Basophils Absolute: 0 10*3/uL (ref 0.0–0.1)
Basophils Relative: 0 %
EOS ABS: 0.5 10*3/uL (ref 0.0–0.7)
EOS PCT: 8 %
HCT: 39.4 % (ref 36.0–46.0)
HEMOGLOBIN: 12.7 g/dL (ref 12.0–15.0)
LYMPHS ABS: 2.8 10*3/uL (ref 0.7–4.0)
LYMPHS PCT: 47 %
MCH: 28.9 pg (ref 26.0–34.0)
MCHC: 32.2 g/dL (ref 30.0–36.0)
MCV: 89.5 fL (ref 78.0–100.0)
MONOS PCT: 11 %
Monocytes Absolute: 0.6 10*3/uL (ref 0.1–1.0)
NEUTROS PCT: 34 %
Neutro Abs: 2 10*3/uL (ref 1.7–7.7)
Platelets: 255 10*3/uL (ref 150–400)
RBC: 4.4 MIL/uL (ref 3.87–5.11)
RDW: 14.2 % (ref 11.5–15.5)
WBC: 5.9 10*3/uL (ref 4.0–10.5)

## 2015-02-19 LAB — URINALYSIS, ROUTINE W REFLEX MICROSCOPIC
GLUCOSE, UA: NEGATIVE mg/dL
HGB URINE DIPSTICK: NEGATIVE
Ketones, ur: NEGATIVE mg/dL
Leukocytes, UA: NEGATIVE
Nitrite: NEGATIVE
PH: 5.5 (ref 5.0–8.0)
Protein, ur: NEGATIVE mg/dL
SPECIFIC GRAVITY, URINE: 1.029 (ref 1.005–1.030)
Urobilinogen, UA: 1 mg/dL (ref 0.0–1.0)

## 2015-02-19 LAB — BASIC METABOLIC PANEL
Anion gap: 6 (ref 5–15)
BUN: 13 mg/dL (ref 6–20)
CHLORIDE: 112 mmol/L — AB (ref 101–111)
CO2: 23 mmol/L (ref 22–32)
CREATININE: 0.88 mg/dL (ref 0.44–1.00)
Calcium: 9.1 mg/dL (ref 8.9–10.3)
GFR calc Af Amer: >60 mL/min (ref >60)
GFR calc non Af Amer: >60 mL/min (ref >60)
Glucose, Bld: 99 mg/dL (ref 65–99)
Potassium: 3.9 mmol/L (ref 3.5–5.1)
Sodium: 141 mmol/L (ref 135–145)

## 2015-02-19 MED ORDER — SODIUM CHLORIDE 0.9 % IV BOLUS (SEPSIS)
1000.0000 mL | Freq: Once | INTRAVENOUS | Status: AC
  Administered 2015-02-19: 1000 mL via INTRAVENOUS

## 2015-02-19 MED ORDER — LOPERAMIDE HCL 2 MG PO CAPS
2.0000 mg | ORAL_CAPSULE | Freq: Once | ORAL | Status: AC
  Administered 2015-02-19: 2 mg via ORAL
  Filled 2015-02-19: qty 1

## 2015-02-19 NOTE — ED Notes (Addendum)
Pt c/o "sinus drainage" for past week. Denies fevers but c/o occasional wheezing. States prod cough as well. States her lungs feel "tight" but denies sob.  States she generally feels weak. Also c/o lower bilateral abd pain that started yesterday. States a couple episodes of diarrhea. Denies any vomiting but states a little nauseated. States pain is constant and describes as cramping.

## 2015-02-19 NOTE — ED Notes (Signed)
Pt ambulatory to br, steady gait

## 2015-02-19 NOTE — ED Notes (Signed)
RT at BS.

## 2015-02-19 NOTE — ED Notes (Signed)
Dr. Molpus at BS 

## 2015-02-19 NOTE — ED Notes (Signed)
C/o diffuse abd pain, also diarrhea, cough and sinus drainage. Last ate ensure and hotdog at ~1600, last BM Saturday morning (watery/ brown), x3 in last 24', (denies: belching, fever, dizziness, sore throat, bleeding, sore throat, urinary or vaginal sx). Taking mucinex ("helping"). H/o cholecystectomy, appy, abd surgery for mass and hysterectomy.

## 2015-02-19 NOTE — ED Provider Notes (Addendum)
CSN: [id number]     Arrival date & time 02/19/15  0130 History   First MD Initiated Contact with Patient 02/19/15 0315     Chief Complaint  Patient presents with  . Fatigue     (Consider location/radiation/quality/duration/timing/severity/associated sxs/prior Treatment) HPI  This is a 49 year old female with multiple chronic medical problems. She is here with a one-week history of a cough with sinus drainage. She has also had intermittent shortness of breath relieved with her albuterol inhaler. She denies being shortness of breath present time. She has been taking Mucinex with partial relief. She denies sore throat  She is here now with a 2 day history of diarrhea, lower abdominal cramping and generalized fatigue. She is lightheaded with standing and the fatigue has been so severe that she has had difficulty getting up and getting around. She denies nausea or vomiting. She denies urinary symptoms, vaginal bleeding or vaginal discharge. She had very little oral intake yesterday.  Past Medical History  Diagnosis Date  . Asthma   . Lupus (HCC)   . Fibromyalgia   . Migraines   . Hypertension   . IBS (irritable bowel syndrome)   . Interstitial cystitis   . Diabetes mellitus without complication (HCC)   . Lupus (systemic lupus erythematosus) (HCC)   . Pseudotumor cerebri   . Chronic kidney disease   . Chronic back pain   . Carpal tunnel syndrome 09/29/2014   Past Surgical History  Procedure Laterality Date  . Abdominal hysterectomy    . Cholecystectomy    . Appendectomy    . Cystoscopy     Family History  Problem Relation Age of Onset  . Graves' disease Mother   . Heart disease Father   . Prostate cancer Father   . Healthy Brother   . Healthy Maternal Aunt   . Healthy Maternal Uncle    Social History  Substance Use Topics  . Smoking status: Never Smoker   . Smokeless tobacco: None  . Alcohol Use: No   OB History    No data available     Review of Systems  All other  systems reviewed and are negative.   Allergies  Nsaids; Codeine; Ramipril; and Imitrex  Home Medications   Prior to Admission medications   Medication Sig Start Date End Date Taking? Authorizing Provider  ALPRAZolam Prudy Feeler) 1 MG tablet Take 1 tablet (1 mg total) by mouth 3 (three) times daily as needed for anxiety. 01/11/15   Asa Lente, MD  baclofen (LIORESAL) 10 MG tablet 1 or 2 pills po tid 08/25/14   Asa Lente, MD  diltiazem (MATZIM LA) 180 MG 24 hr tablet Take 180 mg by mouth daily.    Historical Provider, MD  DULoxetine (CYMBALTA) 60 MG capsule Take 1 capsule (60 mg total) by mouth daily. 01/11/15   Asa Lente, MD  furosemide (LASIX) 40 MG tablet  08/05/14   Historical Provider, MD  hydroxychloroquine (PLAQUENIL) 200 MG tablet Take 1 tablet (200 mg total) by mouth 2 (two) times daily. 01/05/14   Teressa Lower, NP  lactulose, encephalopathy, (CHRONULAC) 10 GM/15ML SOLN  08/26/14   Historical Provider, MD  Montelukast Sodium (SINGULAIR PO) Take by mouth.    Historical Provider, MD  oxyCODONE (ROXICODONE) 15 MG immediate release tablet Take 1 tablet (15 mg total) by mouth every 8 (eight) hours as needed for pain. 01/11/15   Asa Lente, MD  pantoprazole (PROTONIX) 40 MG tablet  08/05/14   Historical Provider, MD  sitaGLIPtin (  JANUVIA) 100 MG tablet Take 100 mg by mouth daily.    Historical Provider, MD  tiZANidine (ZANAFLEX) 4 MG tablet Take 1 tablet (4 mg total) by mouth 3 (three) times daily. 10/23/14   Asa Lente, MD  topiramate (TOPAMAX) 200 MG tablet Take 1 tablet (200 mg total) by mouth 2 (two) times daily. 08/10/14   Asa Lente, MD  traZODone (DESYREL) 100 MG tablet Take 1 tablet (100 mg total) by mouth at bedtime. 11/10/14   Asa Lente, MD  trimethoprim (TRIMPEX) 100 MG tablet  08/05/14   Historical Provider, MD   BP 132/70 mmHg  Pulse 58  Temp(Src) 97.8 F (36.6 C) (Oral)  Resp 18  Ht 5\' 5"  (1.651 m)  Wt 230 lb (104.327 kg)  BMI 38.27 kg/m2  SpO2  100%   Physical Exam  General: Well-developed, well-nourished female in no acute distress; appearance consistent with age of record HENT: normocephalic; atraumatic Eyes: pupils equal, round and reactive to light; extraocular muscles intact Neck: supple Heart: regular rate and rhythm Lungs: clear to auscultation bilaterally Abdomen: soft; nondistended; mild diffuse tenderness; no masses or hepatosplenomegaly; bowel sounds present Extremities: No deformity; full range of motion; pulses normal Neurologic: Awake, alert and oriented; motor function intact in all extremities and symmetric; no facial droop Skin: Warm and dry Psychiatric: Flat affect    ED Course  Procedures (including critical care time)   MDM  Nursing notes and vitals signs, including pulse oximetry, reviewed.  Summary of this visit's results, reviewed by myself:  Labs:  Results for orders placed or performed during the hospital encounter of 02/19/15 (from the past 24 hour(s))  Basic metabolic panel     Status: Abnormal   Collection Time: 02/19/15  5:10 AM  Result Value Ref Range   Sodium 141 135 - 145 mmol/L   Potassium 3.9 3.5 - 5.1 mmol/L   Chloride 112 (H) 101 - 111 mmol/L   CO2 23 22 - 32 mmol/L   Glucose, Bld 99 65 - 99 mg/dL   BUN 13 6 - 20 mg/dL   Creatinine, Ser 4.49 0.44 - 1.00 mg/dL   Calcium 9.1 8.9 - 67.5 mg/dL   GFR calc non Af Amer >60 >60 mL/min   GFR calc Af Amer >60 >60 mL/min   Anion gap 6 5 - 15  CBC with Differential/Platelet     Status: None   Collection Time: 02/19/15  5:10 AM  Result Value Ref Range   WBC 5.9 4.0 - 10.5 K/uL   RBC 4.40 3.87 - 5.11 MIL/uL   Hemoglobin 12.7 12.0 - 15.0 g/dL   HCT 91.6 38.4 - 66.5 %   MCV 89.5 78.0 - 100.0 fL   MCH 28.9 26.0 - 34.0 pg   MCHC 32.2 30.0 - 36.0 g/dL   RDW 99.3 57.0 - 17.7 %   Platelets 255 150 - 400 K/uL   Neutrophils Relative % 34 %   Neutro Abs 2.0 1.7 - 7.7 K/uL   Lymphocytes Relative 47 %   Lymphs Abs 2.8 0.7 - 4.0 K/uL    Monocytes Relative 11 %   Monocytes Absolute 0.6 0.1 - 1.0 K/uL   Eosinophils Relative 8 %   Eosinophils Absolute 0.5 0.0 - 0.7 K/uL   Basophils Relative 0 %   Basophils Absolute 0.0 0.0 - 0.1 K/uL  Urinalysis, Routine w reflex microscopic (not at [facility])     Status: Abnormal   Collection Time: 02/19/15  5:55 AM  Result Value Ref Range  Color, Urine AMBER (A) YELLOW   APPearance CLOUDY (A) CLEAR   Specific Gravity, Urine 1.029 1.005 - 1.030   pH 5.5 5.0 - 8.0   Glucose, UA NEGATIVE NEGATIVE mg/dL   Hgb urine dipstick NEGATIVE NEGATIVE   Bilirubin Urine SMALL (A) NEGATIVE   Ketones, ur NEGATIVE NEGATIVE mg/dL   Protein, ur NEGATIVE NEGATIVE mg/dL   Urobilinogen, UA 1.0 0.0 - 1.0 mg/dL   Nitrite NEGATIVE NEGATIVE   Leukocytes, UA NEGATIVE NEGATIVE   6:51 AM Patient feeling somewhat better after rehydration. Urinalysis was obtained after 1 liter of normal saline. A second liter of normal saline was given. The patient was advised of her reassuring laboratory studies otherwise. She was advised to use over-the-counter Imodium as needed for diarrhea.    Paula Libra, MD 02/19/15 1610  Paula Libra, MD 02/19/15 [phone]

## 2015-02-20 ENCOUNTER — Other Ambulatory Visit: Payer: Self-pay | Admitting: Neurology

## 2015-02-20 MED ORDER — ALPRAZOLAM 1 MG PO TABS: 1.0000 mg | ORAL_TABLET | Freq: Three times a day (TID) | ORAL | Status: DC | PRN

## 2015-02-20 MED ORDER — OXYCODONE HCL 15 MG PO TABS: 15.0000 mg | ORAL_TABLET | Freq: Three times a day (TID) | ORAL | Status: DC | PRN

## 2015-02-20 NOTE — Telephone Encounter (Signed)
Request entered, forwarded to provider for review.  

## 2015-02-20 NOTE — Telephone Encounter (Signed)
Pt called requesting refill for oxyCODONE (ROXICODONE) 15 MG immediate release tablet and ALPRAZolam (XANAX) 1 MG tablet . Pt advised RX will be will be ready within 24 hours unless otherwise informed by RN

## 2015-02-21 ENCOUNTER — Telehealth: Payer: Self-pay | Admitting: Neurology

## 2015-02-21 ENCOUNTER — Encounter: Payer: Self-pay | Admitting: *Deleted

## 2015-02-21 NOTE — Telephone Encounter (Signed)
I have spoken with Connie Arnold this afternoon and advised that Adderall rx. is up front [facility] to be picked up/fim

## 2015-02-21 NOTE — Telephone Encounter (Signed)
Pt called to see if her rx's where ready for pick up. She called yesterday. It looks like they need to be printed and put up at the front desk. Pt is asking that this office call her when they are ready for pick up.

## 2015-02-21 NOTE — Progress Notes (Signed)
Adderall rx. up front [facility]/fim 

## 2015-03-16 NOTE — Telephone Encounter (Signed)
Error

## 2015-03-17 DIAGNOSIS — K760 Fatty (change of) liver, not elsewhere classified: Secondary | ICD-10-CM | POA: Insufficient documentation

## 2015-03-17 HISTORY — DX: Fatty (change of) liver, not elsewhere classified: K76.0

## 2015-03-21 ENCOUNTER — Other Ambulatory Visit: Payer: Self-pay | Admitting: Neurology

## 2015-03-21 MED ORDER — ALPRAZOLAM 1 MG PO TABS: 1.0000 mg | ORAL_TABLET | Freq: Three times a day (TID) | ORAL | Status: DC | PRN

## 2015-03-21 MED ORDER — OXYCODONE HCL 15 MG PO TABS: 15.0000 mg | ORAL_TABLET | Freq: Three times a day (TID) | ORAL | Status: DC | PRN

## 2015-03-21 NOTE — Telephone Encounter (Signed)
Request has been forwarded to provider for approval  

## 2015-03-21 NOTE — Telephone Encounter (Signed)
Patient called to request refill of ALPRAZolam (XANAX) 1 MG tablet and oxyCODONE (ROXICODONE) 15 MG immediate release tablet

## 2015-03-22 ENCOUNTER — Encounter: Payer: Self-pay | Admitting: *Deleted

## 2015-03-22 NOTE — Progress Notes (Signed)
Alprazolam and Oxycodone rx. up front [facility]/fim

## 2015-03-29 DIAGNOSIS — G4440 Drug-induced headache, not elsewhere classified, not intractable: Secondary | ICD-10-CM | POA: Insufficient documentation

## 2015-03-29 DIAGNOSIS — G932 Benign intracranial hypertension: Secondary | ICD-10-CM | POA: Insufficient documentation

## 2015-04-19 ENCOUNTER — Other Ambulatory Visit: Payer: Self-pay | Admitting: Neurology

## 2015-04-19 MED ORDER — OXYCODONE HCL 15 MG PO TABS: 15.0000 mg | ORAL_TABLET | Freq: Three times a day (TID) | ORAL | Status: DC | PRN

## 2015-04-19 MED ORDER — ALPRAZOLAM 1 MG PO TABS: 1.0000 mg | ORAL_TABLET | Freq: Three times a day (TID) | ORAL | Status: DC | PRN

## 2015-04-19 NOTE — Telephone Encounter (Signed)
Patient called to request refills of ALPRAZolam (XANAX) 1 MG tablet and oxyCODONE (ROXICODONE) 15 MG immediate release tablet.

## 2015-04-20 ENCOUNTER — Encounter: Payer: Self-pay | Admitting: *Deleted

## 2015-04-20 NOTE — Progress Notes (Signed)
Xanax and Oxycodone rx's up front [facility]/fim

## 2015-05-01 ENCOUNTER — Ambulatory Visit (INDEPENDENT_AMBULATORY_CARE_PROVIDER_SITE_OTHER): Payer: Medicare Other | Admitting: Neurology

## 2015-05-01 ENCOUNTER — Encounter: Payer: Self-pay | Admitting: Neurology

## 2015-05-01 VITALS — BP 124/86 | HR 68 | Resp 16 | Ht 65.0 in | Wt 228.2 lb

## 2015-05-01 DIAGNOSIS — M797 Fibromyalgia: Secondary | ICD-10-CM

## 2015-05-01 DIAGNOSIS — M542 Cervicalgia: Secondary | ICD-10-CM

## 2015-05-01 DIAGNOSIS — F329 Major depressive disorder, single episode, unspecified: Secondary | ICD-10-CM | POA: Diagnosis not present

## 2015-05-01 DIAGNOSIS — G43709 Chronic migraine without aura, not intractable, without status migrainosus: Secondary | ICD-10-CM | POA: Diagnosis not present

## 2015-05-01 DIAGNOSIS — M5010 Cervical disc disorder with radiculopathy, unspecified cervical region: Secondary | ICD-10-CM | POA: Diagnosis not present

## 2015-05-01 DIAGNOSIS — F32A Depression, unspecified: Secondary | ICD-10-CM

## 2015-05-01 DIAGNOSIS — M329 Systemic lupus erythematosus, unspecified: Secondary | ICD-10-CM | POA: Diagnosis not present

## 2015-05-01 DIAGNOSIS — G4700 Insomnia, unspecified: Secondary | ICD-10-CM | POA: Diagnosis not present

## 2015-05-01 DIAGNOSIS — M7551 Bursitis of right shoulder: Secondary | ICD-10-CM

## 2015-05-01 MED ORDER — ALPRAZOLAM 1 MG PO TABS: 1.0000 mg | ORAL_TABLET | Freq: Three times a day (TID) | ORAL | Status: DC | PRN

## 2015-05-01 MED ORDER — OXYCODONE HCL 15 MG PO TABS: 15.0000 mg | ORAL_TABLET | Freq: Three times a day (TID) | ORAL | Status: DC | PRN

## 2015-05-01 MED ORDER — PHENTERMINE HCL 37.5 MG PO CAPS: 37.5000 mg | ORAL_CAPSULE | ORAL | Status: DC

## 2015-05-01 NOTE — Progress Notes (Signed)
[facility]  PATIENT: Connie Arnold DOB: 04-12-1966     HISTORICAL  CHIEF COMPLAINT:  Chief Complaint  Patient presents with  . Migraines    Sts. migraines are about the same frequency, same severity. Sts. neck/joint pain is about the same.  Good days and bad.  Sts. doesn't really see a difference in mood since stopping Prozac and starting Cymbalta.  Sts. has started Benlysta infusions for Lupus--has only had 2 infusions, so does not notice a difference yet./fim  . Neck Pain  . Lupus    HISTORY OF PRESENT ILLNESS:  Connie Arnold is a 49 year old woman with chronic migraine headaches and other neurologic issues. Prescott [place], she has had more shoulder and neck pain and continues to have near daily headache.     She is still experiencing chronic headaches,     Currently, she has bilateral throbbing pressure like pain and a stiff painful neck (though better than last week).   She has photophobia, phonophobia and nausea with headaches.  No vomiting.   Headaches are worse in the afternoons   Moving intensifies the pain.     Her neck pain is worse on the right and will go into shoulder and arm more.    The right shoulder is tender.    She also has lower back pain.    She continues to have pain in her lumbar spine, especially near the site of last LP.        Pain med's:      She is taking 30 mg daily of the baclofen and tid oxycodone 15 mg po, alprazolam tid.  She is also on tizanidine prn.     She is on topiramate 200 mg po bid (it helped at first, not sure if benefit now).  Mood is better on Cymbalta than on the SSRI and she tolerates it well.   Unfortunately, pain did not improve.  She is still apathetic and  spends much of day in bed or watching TV.  She feels more frustrated and depressed with the worsening headaches and is very apathetic.   She is on prozac 40 mg.     SLE:   She was diagnosed with SLE in 2006 and has been on Plaquenil x years.    She remains on that  and Benlysta.    She gets her 3rd infusion this week and then will be on monthly  ------------ Chronic Migraine History:   She has had chronic migraine headache with moderate pain, flaring up to severe,  for most of the past couple of years   She is having chronic migraine 30/30 days every month, lasting for more than 4 hours every day.  She has received some benefit from occipital nerve blocks, trigger point injections and medications but the headaches still persist on a daily basis for past couple years.    She had been on Botox for migraine prophylaxis in the past with benefit. However, with changes in insurance she was unable to continue due to very high co-pay. She is currently on Topamax 200 mg by mouth twice a day and muscle relaxants and opiates.  She has occasional emergency room visits and frequent additional visits to my office for trigger point injections..  She has tried and failed multiple prophylactic medications for her chronic migraine: Antiepileptics:    zonisamide, topiramate, Keppra.   (Due to obesity, Depakote is a poor choice) Anti-depressants  amitriptyline, imipramine Anti-hypertensives:  Diamox Muscle relaxants:  baclofen,  tizanidine NSAID's:  She is unable to tolerate chronic anti-inflammatories due to mild renal insufficiency.  She has had multiple visits to ER and to our office for injections when pain is more severe.     ALLERGIES: Allergies  Allergen Reactions  . Nsaids Other (See Comments)    Chronic kidney failure  . Codeine   . Ramipril   . Imitrex [Sumatriptan] Nausea And Vomiting    HOME MEDICATIONS:  Current outpatient prescriptions:  .  ADVAIR DISKUS 250-50 MCG/DOSE AEPB, , Disp: , Rfl:  .  albuterol (PROVENTIL) (2.5 MG/3ML) 0.083% nebulizer solution, , Disp: , Rfl:  .  ALPRAZolam (XANAX) 1 MG tablet, Take 1 tablet (1 mg total) by mouth 3 (three) times daily as needed for anxiety., Disp: 90 tablet, Rfl: 0 .  Azelastine-Fluticasone 137-50 MCG/ACT  SUSP, Place into the nose., Disp: , Rfl:  .  baclofen (LIORESAL) 10 MG tablet, 1 or 2 pills po tid, Disp: 540 each, Rfl: 3 .  bupivacaine (MARCAINE PRESERVATIVE FREE) 0.5 % SOLN injection, Instill 15cc into bladder daily, Disp: , Rfl:  .  cetirizine (ZYRTEC) 10 MG tablet, Take 10 mg by mouth., Disp: , Rfl:  .  diltiazem (MATZIM LA) 180 MG 24 hr tablet, Take 180 mg by mouth daily., Disp: , Rfl:  .  DULoxetine (CYMBALTA) 60 MG capsule, Take 1 capsule (60 mg total) by mouth daily., Disp: 30 capsule, Rfl: 11 .  fluconazole (DIFLUCAN) 150 MG tablet, Take 150 mg by mouth., Disp: , Rfl:  .  furosemide (LASIX) 40 MG tablet, , Disp: , Rfl:  .  glucose blood test strip, , Disp: , Rfl:  .  heparin 71219 UNIT/ML injection, Instill 4 cc into bladder 2x daily, Disp: , Rfl:  .  hydroxychloroquine (PLAQUENIL) 200 MG tablet, Take 1 tablet (200 mg total) by mouth 2 (two) times daily., Disp: 30 tablet, Rfl: 0 .  hyoscyamine (LEVSIN SL) 0.125 MG SL tablet, Take 0.125 mg by mouth., Disp: , Rfl:  .  ipratropium-albuterol (DUONEB) 0.5-2.5 (3) MG/3ML SOLN, 3 mLs., Disp: , Rfl:  .  lactulose, encephalopathy, (CHRONULAC) 10 GM/15ML SOLN, , Disp: , Rfl:  .  magic mouthwash w/lidocaine SOLN, , Disp: , Rfl:  .  Montelukast Sodium (SINGULAIR PO), Take by mouth., Disp: , Rfl:  .  oxyCODONE (ROXICODONE) 15 MG immediate release tablet, Take 1 tablet (15 mg total) by mouth every 8 (eight) hours as needed for pain., Disp: 90 tablet, Rfl: 0 .  pantoprazole (PROTONIX) 40 MG tablet, , Disp: , Rfl:  .  promethazine (PHENERGAN) 50 MG/ML injection, Frequency:   Dosage:0.0     Instructions:  Note:, Disp: , Rfl:  .  sitaGLIPtin (JANUVIA) 100 MG tablet, Take 100 mg by mouth daily., Disp: , Rfl:  .  tiZANidine (ZANAFLEX) 4 MG tablet, Take 1 tablet (4 mg total) by mouth 3 (three) times daily., Disp: 90 tablet, Rfl: 2 .  topiramate (TOPAMAX) 200 MG tablet, Take 1 tablet (200 mg total) by mouth 2 (two) times daily., Disp: 180 tablet, Rfl:  3 .  traZODone (DESYREL) 100 MG tablet, Take 1 tablet (100 mg total) by mouth at bedtime., Disp: 90 tablet, Rfl: 3 .  triamcinolone (NASACORT) 55 MCG/ACT AERO nasal inhaler, Place 2 sprays into the nose., Disp: , Rfl:  .  trimethoprim (TRIMPEX) 100 MG tablet, Take 1 tablet by mouth  daily, Disp: , Rfl:  .  Vitamin D, Ergocalciferol, (DRISDOL) 50000 UNITS CAPS capsule, Take 50,000 Units by mouth., Disp: , Rfl:  .  zolpidem (AMBIEN) 10 MG tablet, Frequency:   Dosage:0   MG  Instructions:  Note:Take 1 po nightly, Disp: , Rfl:   PAST MEDICAL HISTORY: Past Medical History  Diagnosis Date  . Asthma   . Lupus (HCC)   . Fibromyalgia   . Migraines   . Hypertension   . IBS (irritable bowel syndrome)   . Interstitial cystitis   . Diabetes mellitus without complication (HCC)   . Lupus (systemic lupus erythematosus) (HCC)   . Pseudotumor cerebri   . Chronic kidney disease   . Chronic back pain   . Carpal tunnel syndrome 09/29/2014  . Fatty infiltration of liver 03/17/2015    PAST SURGICAL HISTORY: Past Surgical History  Procedure Laterality Date  . Abdominal hysterectomy    . Cholecystectomy    . Appendectomy    . Cystoscopy      FAMILY HISTORY: Family History  Problem Relation Age of Onset  . Graves' disease Mother   . Heart disease Father   . Prostate cancer Father   . Healthy Brother   . Healthy Maternal Aunt   . Healthy Maternal Uncle     SOCIAL HISTORY:  Social History   Social History  . Marital Status: Married    Spouse Name: N/A  . Number of Children: N/A  . Years of Education: N/A   Occupational History  . Not on file.   Social History Main Topics  . Smoking status: Never Smoker   . Smokeless tobacco: Not on file  . Alcohol Use: No  . Drug Use: No  . Sexual Activity: Not on file   Other Topics Concern  . Not on file   Social History Narrative     PHYSICAL EXAM  Filed Vitals:   05/01/15 0931  BP: 124/86  Pulse: 68  Resp: 16  Height: 5\' 5"   (1.651 m)  Weight: 228 lb 3.2 oz (103.511 kg)    Body mass index is 37.97 kg/(m^2).   General: The patient is an obese woman in no distress.  Neck: The neck is supple.  The neck is tender at the right worse than left splenius capitis muscles,  lower cervical paraspinal, upper thoracic paraspinals, rhomboid muscles.     Neurologic Exam  Mental status: The patient is alert and oriented x 3 at the time of the examination. The patient has apparent normal recent and remote memory, with an apparently normal attention span and concentration ability.   Speech is normal.  Cranial nerves: Extraocular movements are full.     Facial symmetry is present.  Facial strength is normal.  Trapezius and sternocleidomastoid strength is normal. No dysarthria is noted.    Motor:  Muscle bulk is normal.   Tone is normal. Strength is  5 / 5 in limbs  Sensory: Sensory testing shows symmetric touch sensation proximally in limbs bur she reports reduced sensation in right hand  Gait and station: Station is normal.   Gait is normal.   Reflexes: Deep tendon reflexes are symmetric and normal bilaterally.     DIAGNOSTIC DATA (LABS, IMAGING, TESTING) - I reviewed patient records, labs, notes, testing and imaging myself where available.  Lab Results  Component Value Date   WBC 5.9 02/19/2015   HGB 12.7 02/19/2015   HCT 39.4 02/19/2015   MCV 89.5 02/19/2015   PLT 255 02/19/2015        ASSESSMENT AND PLAN  Chronic migraine w/o aura w/o status migrainosus, not intractable  Fibromyalgia  Cervical disc disorder with radiculopathy  of cervical region  Neck pain  Depression  Insomnia  Systemic lupus erythematosus (HCC)   1.   Trigger point inject the lateral splenius capitis, right C6-C7 and both trapezius muscles with 60 mg Depo-Medrol in Marcaine using sterile technique 2. Using sterile technique, inject right subacromial bursa with 20 mg Depo-Medrol in Marcaine.  3.   Continue medications.   Renew oxycodone  Return to see [place] in 3 months or call sooner if she is no better or has new or worsening neurologic symptoms.   Richard A. Epimenio Foot, MD, PhD 05/01/2015, 9:48 AM Certified in Neurology, Clinical Neurophysiology, Sleep Medicine, Pain Medicine and Neuroimaging  [facility] [address], [address] [place], [place] [address] [phone]

## 2015-05-16 ENCOUNTER — Ambulatory Visit: Payer: Medicare Other | Admitting: Neurology

## 2015-06-01 ENCOUNTER — Telehealth: Payer: Self-pay | Admitting: Neurology

## 2015-06-01 NOTE — Telephone Encounter (Signed)
Pt called said her husband has FMLA papers need to be updated again. She will be bringing them to pay $25 fee.

## 2015-06-01 NOTE — Telephone Encounter (Signed)
noted/fim 

## 2015-06-13 DIAGNOSIS — R8290 Unspecified abnormal findings in urine: Secondary | ICD-10-CM | POA: Insufficient documentation

## 2015-06-16 ENCOUNTER — Telehealth: Payer: Self-pay | Admitting: Neurology

## 2015-06-16 ENCOUNTER — Other Ambulatory Visit: Payer: Self-pay | Admitting: Neurology

## 2015-06-16 MED ORDER — ALPRAZOLAM 1 MG PO TABS: 1.0000 mg | ORAL_TABLET | Freq: Three times a day (TID) | ORAL | Status: DC | PRN

## 2015-06-16 MED ORDER — OXYCODONE HCL 15 MG PO TABS: 15.0000 mg | ORAL_TABLET | Freq: Three times a day (TID) | ORAL | Status: DC | PRN

## 2015-06-16 NOTE — Telephone Encounter (Signed)
Patient is calling to get written Rx's for medications oxyCODONE (ROXICODONE) 15 MG immediate release tablet and ALPRAZolam (XANAX) 1 MG tablet. I advised the Rx's will be ready in 24 hours (Monday) unless the nurse advises otherwise. Thank you.

## 2015-06-16 NOTE — Telephone Encounter (Signed)
Patient is calling to get written Rx's for

## 2015-06-19 ENCOUNTER — Encounter: Payer: Self-pay | Admitting: *Deleted

## 2015-06-19 NOTE — Progress Notes (Signed)
Xanax and Oxycodone rx's up front [facility]/fim 

## 2015-06-27 DIAGNOSIS — K5909 Other constipation: Secondary | ICD-10-CM | POA: Insufficient documentation

## 2015-06-27 DIAGNOSIS — E559 Vitamin D deficiency, unspecified: Secondary | ICD-10-CM | POA: Insufficient documentation

## 2015-06-27 DIAGNOSIS — E119 Type 2 diabetes mellitus without complications: Secondary | ICD-10-CM | POA: Insufficient documentation

## 2015-06-27 DIAGNOSIS — J4540 Moderate persistent asthma, uncomplicated: Secondary | ICD-10-CM | POA: Insufficient documentation

## 2015-06-27 DIAGNOSIS — G43701 Chronic migraine without aura, not intractable, with status migrainosus: Secondary | ICD-10-CM | POA: Insufficient documentation

## 2015-06-27 DIAGNOSIS — Z23 Encounter for immunization: Secondary | ICD-10-CM | POA: Insufficient documentation

## 2015-06-27 DIAGNOSIS — I10 Essential (primary) hypertension: Secondary | ICD-10-CM | POA: Insufficient documentation

## 2015-06-27 DIAGNOSIS — G43909 Migraine, unspecified, not intractable, without status migrainosus: Secondary | ICD-10-CM | POA: Insufficient documentation

## 2015-06-29 ENCOUNTER — Other Ambulatory Visit: Payer: Self-pay | Admitting: Neurology

## 2015-07-18 ENCOUNTER — Telehealth: Payer: Self-pay | Admitting: Neurology

## 2015-07-18 MED ORDER — OXYCODONE HCL 15 MG PO TABS: 15.0000 mg | ORAL_TABLET | Freq: Three times a day (TID) | ORAL | Status: DC | PRN

## 2015-07-18 MED ORDER — ALPRAZOLAM 1 MG PO TABS: 1.0000 mg | ORAL_TABLET | Freq: Three times a day (TID) | ORAL | Status: DC | PRN

## 2015-07-18 NOTE — Telephone Encounter (Signed)
Rx's awaiting RAS sig/fim 

## 2015-07-18 NOTE — Telephone Encounter (Signed)
Pt called requesting refill for ALPRAZolam (XANAX) 1 MG tablet and oxyCODONE (ROXICODONE) 15 MG immediate release tablet .

## 2015-07-19 NOTE — Telephone Encounter (Signed)
Oxycodone and Xanax rx's up front [facility]/fim

## 2015-08-14 ENCOUNTER — Telehealth: Payer: Self-pay | Admitting: Neurology

## 2015-08-14 MED ORDER — ALPRAZOLAM 1 MG PO TABS: 1.0000 mg | ORAL_TABLET | Freq: Three times a day (TID) | ORAL | Status: DC | PRN

## 2015-08-14 MED ORDER — OXYCODONE HCL 15 MG PO TABS: 15.0000 mg | ORAL_TABLET | Freq: Three times a day (TID) | ORAL | Status: DC | PRN

## 2015-08-14 NOTE — Telephone Encounter (Signed)
Alprazolam and Oxycodone rx's up front [facility]/fim 

## 2015-08-14 NOTE — Telephone Encounter (Signed)
Pt requesting refill for ALPRAZolam (XANAX) 1 MG tablet and oxyCODONE (ROXICODONE) 15 MG immediate release tablet .

## 2015-08-14 NOTE — Telephone Encounter (Signed)
Rx's awaiting RAS sig/fim 

## 2015-08-30 ENCOUNTER — Ambulatory Visit (INDEPENDENT_AMBULATORY_CARE_PROVIDER_SITE_OTHER): Payer: Medicare Other | Admitting: Neurology

## 2015-08-30 ENCOUNTER — Encounter: Payer: Self-pay | Admitting: Neurology

## 2015-08-30 VITALS — BP 114/78 | HR 74 | Resp 18 | Ht 65.0 in | Wt 221.0 lb

## 2015-08-30 DIAGNOSIS — M797 Fibromyalgia: Secondary | ICD-10-CM

## 2015-08-30 DIAGNOSIS — M542 Cervicalgia: Secondary | ICD-10-CM

## 2015-08-30 DIAGNOSIS — G4700 Insomnia, unspecified: Secondary | ICD-10-CM | POA: Diagnosis not present

## 2015-08-30 DIAGNOSIS — G43709 Chronic migraine without aura, not intractable, without status migrainosus: Secondary | ICD-10-CM

## 2015-08-30 MED ORDER — TOPIRAMATE 200 MG PO TABS: 200.0000 mg | ORAL_TABLET | Freq: Two times a day (BID) | ORAL | Status: DC

## 2015-08-30 MED ORDER — ALPRAZOLAM 1 MG PO TABS: 1.0000 mg | ORAL_TABLET | Freq: Three times a day (TID) | ORAL | Status: DC | PRN

## 2015-08-30 MED ORDER — OXYCODONE HCL 15 MG PO TABS: 15.0000 mg | ORAL_TABLET | Freq: Three times a day (TID) | ORAL | Status: DC | PRN

## 2015-08-30 MED ORDER — TRAZODONE HCL 100 MG PO TABS: 100.0000 mg | ORAL_TABLET | Freq: Every day | ORAL | Status: DC

## 2015-08-30 NOTE — Progress Notes (Signed)
[facility]  PATIENT: Connie Arnold DOB: Feb 08, 1966     HISTORICAL  CHIEF COMPLAINT:  Chief Complaint  Patient presents with  . Migraines    Sts. h/a's and neck pain are "not too bad."/fim  . Neck Pain    HISTORY OF PRESENT ILLNESS:  Connie Arnold is a 50 year old woman with chronic migraine headaches and other neurologic issues. Pres[place], she has had more shoulder and neck pain and continues to have near daily headache.     She is still experiencing chronic headaches but they are now 4 days a week (16-18/month).   When present, pain is bilateral and throbbing.   There is often neck pain.    She has photophobia, phonophobia and nausea with headaches.  No vomiting.   Headaches are worse in the afternoons   Moving intensifies the pain.     Her neck pain is better than at last visit...milder and not constant.   She also has pain between her shoulder blades.      She is exercising more and doing stretches.   She feels better and has lost some weight.      Pain med's:      She is taking 30 mg daily of the baclofen and tid oxycodone 15 mg po tid, alprazolam tid.  She is also on tizanidine prn.     She is on topiramate 200 mg po bid (it helped at first, not sure if benefit now).  Mood is better on Cymbalta than on the SSRI and she tolerates it well.   Unfortunately, pain did not improve.  She is still apathetic and  spends much of day in bed or watching TV.    SLE:   She was diagnosed with SLE in 2006 and has been on Plaquenil x years and more recently Benlysta.    She gets her 3rd infusion this week and then will be on monthly  ------------ Chronic Migraine History:   She has had chronic migraine headache with moderate pain, flaring up to severe,  for most of the past couple of years   She is having chronic migraine 30/30 days every month, lasting for more than 4 hours every day.  She has received some benefit from occipital nerve blocks, trigger point injections  and medications but the headaches still persist on a daily basis for past couple years.    She had been on Botox for migraine prophylaxis in the past with benefit. However, with changes in insurance she was unable to continue due to very high co-pay. She is currently on Topamax 200 mg by mouth twice a day and muscle relaxants and opiates.  She has occasional emergency room visits and frequent additional visits to my office for trigger point injections..  She has tried and failed multiple prophylactic medications for her chronic migraine: Antiepileptics:    zonisamide, topiramate, Keppra.   (Due to obesity, Depakote is a poor choice) Anti-depressants  amitriptyline, imipramine Anti-hypertensives:  Diamox Muscle relaxants:  baclofen, tizanidine NSAID's:  She is unable to tolerate chronic anti-inflammatories due to mild renal insufficiency.  She has had multiple visits to ER and to our office for injections when pain is more severe.     ALLERGIES: Allergies  Allergen Reactions  . Nsaids Other (See Comments)    Chronic kidney failure  . Codeine   . Ramipril   . Imitrex [Sumatriptan] Nausea And Vomiting    HOME MEDICATIONS:  Current outpatient prescriptions:  .  ADVAIR  DISKUS 250-50 MCG/DOSE AEPB, , Disp: , Rfl:  .  albuterol (PROVENTIL) (2.5 MG/3ML) 0.083% nebulizer solution, , Disp: , Rfl:  .  ALPRAZolam (XANAX) 1 MG tablet, Take 1 tablet (1 mg total) by mouth 3 (three) times daily as needed for anxiety., Disp: 90 tablet, Rfl: 0 .  Azelastine-Fluticasone 137-50 MCG/ACT SUSP, Place into the nose., Disp: , Rfl:  .  baclofen (LIORESAL) 10 MG tablet, 1 or 2 pills po tid, Disp: 540 each, Rfl: 3 .  bupivacaine (MARCAINE PRESERVATIVE FREE) 0.5 % SOLN injection, Instill 15cc into bladder daily, Disp: , Rfl:  .  cetirizine (ZYRTEC) 10 MG tablet, Take 10 mg by mouth., Disp: , Rfl:  .  diltiazem (MATZIM LA) 180 MG 24 hr tablet, Take 180 mg by mouth daily., Disp: , Rfl:  .  DULoxetine (CYMBALTA)  60 MG capsule, Take 1 capsule (60 mg total) by mouth daily., Disp: 30 capsule, Rfl: 11 .  fluconazole (DIFLUCAN) 150 MG tablet, Take 150 mg by mouth., Disp: , Rfl:  .  furosemide (LASIX) 40 MG tablet, , Disp: , Rfl:  .  glucose blood test strip, , Disp: , Rfl:  .  heparin [address] UNIT/ML injection, Instill 4 cc into bladder 2x daily, Disp: , Rfl:  .  hydroxychloroquine (PLAQUENIL) 200 MG tablet, Take 1 tablet (200 mg total) by mouth 2 (two) times daily., Disp: 30 tablet, Rfl: 0 .  hyoscyamine (LEVSIN SL) 0.125 MG SL tablet, Take 0.125 mg by mouth., Disp: , Rfl:  .  ipratropium-albuterol (DUONEB) 0.5-2.5 (3) MG/3ML SOLN, 3 mLs., Disp: , Rfl:  .  lactulose, encephalopathy, (CHRONULAC) 10 GM/15ML SOLN, , Disp: , Rfl:  .  Montelukast Sodium (SINGULAIR PO), Take by mouth., Disp: , Rfl:  .  oxyCODONE (ROXICODONE) 15 MG immediate release tablet, Take 1 tablet (15 mg total) by mouth every 8 (eight) hours as needed for pain., Disp: 90 tablet, Rfl: 0 .  pantoprazole (PROTONIX) 40 MG tablet, , Disp: , Rfl:  .  promethazine (PHENERGAN) 50 MG/ML injection, Frequency:   Dosage:0.0     Instructions:  Note:, Disp: , Rfl:  .  sitaGLIPtin (JANUVIA) 100 MG tablet, Take 100 mg by mouth daily., Disp: , Rfl:  .  tiZANidine (ZANAFLEX) 4 MG tablet, Take 1 tablet (4 mg total) by mouth 3 (three) times daily., Disp: 90 tablet, Rfl: 2 .  topiramate (TOPAMAX) 200 MG tablet, Take 1 tablet by mouth  twice a day, Disp: 180 tablet, Rfl: 5 .  traZODone (DESYREL) 100 MG tablet, Take 1 tablet (100 mg total) by mouth at bedtime., Disp: 90 tablet, Rfl: 3 .  triamcinolone (NASACORT) 55 MCG/ACT AERO nasal inhaler, Place 2 sprays into the nose., Disp: , Rfl:  .  trimethoprim (TRIMPEX) 100 MG tablet, Take 1 tablet by mouth  daily, Disp: , Rfl:  .  Vitamin D, Ergocalciferol, (DRISDOL) 50000 UNITS CAPS capsule, Take 50,000 Units by mouth., Disp: , Rfl:  .  zolpidem (AMBIEN) 10 MG tablet, Frequency:   Dosage:0   MG  Instructions:  Note:Take 1  po nightly, Disp: , Rfl:   PAST MEDICAL HISTORY: Past Medical History  Diagnosis Date  . Asthma   . Lupus (HCC)   . Fibromyalgia   . Migraines   . Hypertension   . IBS (irritable bowel syndrome)   . Interstitial cystitis   . Diabetes mellitus without complication (HCC)   . Lupus (systemic lupus erythematosus) (HCC)   . Pseudotumor cerebri   . Chronic kidney disease   .  Chronic back pain   . Carpal tunnel syndrome 09/29/2014  . Fatty infiltration of liver 03/17/2015    PAST SURGICAL HISTORY: Past Surgical History  Procedure Laterality Date  . Abdominal hysterectomy    . Cholecystectomy    . Appendectomy    . Cystoscopy      FAMILY HISTORY: Family History  Problem Relation Age of Onset  . Graves' disease Mother   . Heart disease Father   . Prostate cancer Father   . Healthy Brother   . Healthy Maternal Aunt   . Healthy Maternal Uncle     SOCIAL HISTORY:  Social History   Social History  . Marital Status: Married    Spouse Name: N/A  . Number of Children: N/A  . Years of Education: N/A   Occupational History  . Not on file.   Social History Main Topics  . Smoking status: Never Smoker   . Smokeless tobacco: Not on file  . Alcohol Use: No  . Drug Use: No  . Sexual Activity: Not on file   Other Topics Concern  . Not on file   Social History Narrative     PHYSICAL EXAM  Filed Vitals:   08/30/15 0940  BP: 114/78  Pulse: 74  Resp: 18  Height: 5\' 5"  (1.651 m)  Weight: 221 lb (100.245 kg)    Body mass index is 36.78 kg/(m^2).   General: The patient is an obese woman in no distress.  Neck: The neck is supple.  The neck is mildly tender at the right worse than left splenius capitis muscles,  lower cervical paraspinal, upper thoracic paraspinals, rhomboid muscles.     Musculoskeletal:  Currently no shoulder tenderness. Good range of motion in the shoulder.  Neurologic Exam  Mental status: The patient is alert and oriented x 3 at the time of  the examination. The patient has apparent normal recent and remote memory, with an apparently normal attention span and concentration ability.   Speech is normal.  Cranial nerves: Extraocular movements are full.     Facial symmetry is present.  Facial strength is normal.  Trapezius and sternocleidomastoid strength is normal. No dysarthria is noted.    Motor:  Muscle bulk is normal.   Tone is normal. Strength is  5 / 5 in limbs  Sensory: Sensory testing shows symmetric touch sensation proximally in limbs bur she reports reduced sensation in right hand  Gait and station: Station is normal.   Gait is normal.   Reflexes: Deep tendon reflexes are symmetric and normal bilaterally.     DIAGNOSTIC DATA (LABS, IMAGING, TESTING) - I reviewed patient records, labs, notes, testing and imaging myself where available.  Lab Results  Component Value Date   WBC 5.9 02/19/2015   HGB 12.7 02/19/2015   HCT 39.4 02/19/2015   MCV 89.5 02/19/2015   PLT 255 02/19/2015        ASSESSMENT AND PLAN  Chronic migraine without aura without status migrainosus, not intractable  Fibromyalgia  Insomnia  Neck pain   1.   Continue current medications. Renew scripts. 2.    We discussed a drug study WILL be participating in soon for chronic migraine. This might be a good option for her as she did not get Botox approved.   3.   Return to see [place] in 3-4 months or call sooner if new or worsening neurologic symptoms.   Adonis Yim A. Epimenio Foot, MD, PhD 08/30/2015, 9:47 AM Certified in Neurology, Clinical Neurophysiology, Sleep Medicine, Pain Medicine and  Neuroimaging  [facility] [address] [place], [place] [address] [phone].

## 2015-10-11 ENCOUNTER — Telehealth: Payer: Self-pay | Admitting: *Deleted

## 2015-10-11 MED ORDER — ALPRAZOLAM 1 MG PO TABS: 1.0000 mg | ORAL_TABLET | Freq: Three times a day (TID) | ORAL | Status: DC | PRN

## 2015-10-11 MED ORDER — OXYCODONE HCL 15 MG PO TABS: 15.0000 mg | ORAL_TABLET | Freq: Three times a day (TID) | ORAL | Status: DC | PRN

## 2015-10-11 NOTE — Telephone Encounter (Signed)
Message For: [facility]                  Taken 24-MAY-17 at  2:10PM by [facility] ------------------------------------------------------------ Carlyle Basques              CID [id number]  Patient SAME                 Pt's Dr Epimenio Foot        Area Code 336 Phone# [phone] * DOB 05/30/2065     RE RX REFILL REQ//PCB                                                                                     Disp:Y/N N If Y = C/B If No Response In ============================================================

## 2015-10-11 NOTE — Telephone Encounter (Signed)
LMTC.  I need to know which medication she is requesting a refill of /fim

## 2015-10-11 NOTE — Telephone Encounter (Signed)
Alprazolam and Oxycodone rx's up front [facility]/fim 

## 2015-10-11 NOTE — Telephone Encounter (Signed)
Patient requesting refill of oxyCODONE (ROXICODONE) 15 MG immediate release tablet and ALPRAZolam (XANAX) 1 MG tablet

## 2015-10-11 NOTE — Telephone Encounter (Signed)
Rx's awaiting RAS sig/fim 

## 2015-10-31 ENCOUNTER — Other Ambulatory Visit: Payer: Self-pay | Admitting: Neurology

## 2015-11-13 ENCOUNTER — Telehealth: Payer: Self-pay | Admitting: Neurology

## 2015-11-13 MED ORDER — OXYCODONE HCL 15 MG PO TABS
15.0000 mg | ORAL_TABLET | Freq: Three times a day (TID) | ORAL | Status: DC | PRN
Start: 2015-11-13 — End: 2015-12-13

## 2015-11-13 MED ORDER — ALPRAZOLAM 1 MG PO TABS: 1.0000 mg | ORAL_TABLET | Freq: Three times a day (TID) | ORAL | Status: DC | PRN

## 2015-11-13 NOTE — Telephone Encounter (Signed)
Oxycodone and Xanax rx's up front [facility]/fim 

## 2015-11-13 NOTE — Telephone Encounter (Signed)
Rx's awaiting RAS sig/fim 

## 2015-11-13 NOTE — Telephone Encounter (Signed)
Patient requesting refill of oxyCODONE (ROXICODONE) 15 MG immediate release tablet , ALPRAZolam (XANAX) 1 MG tablet Pharmacy: pick up

## 2015-12-11 ENCOUNTER — Telehealth: Payer: Self-pay | Admitting: Neurology

## 2015-12-11 MED ORDER — ALPRAZOLAM 1 MG PO TABS: 1.0000 mg | ORAL_TABLET | Freq: Three times a day (TID) | ORAL | 0 refills | Status: DC | PRN

## 2015-12-11 NOTE — Telephone Encounter (Signed)
Rx. awaiting RAS sig/fim 

## 2015-12-11 NOTE — Telephone Encounter (Signed)
Pt request refill for   ALPRAZolam (XANAX) 1 MG tablet   Pt is wanting to pick this RX up at clinic tomorrow

## 2015-12-12 NOTE — Telephone Encounter (Signed)
Rx. up front [facility]/fim 

## 2015-12-13 ENCOUNTER — Telehealth: Payer: Self-pay | Admitting: Neurology

## 2015-12-13 MED ORDER — OXYCODONE HCL 15 MG PO TABS: 15.0000 mg | ORAL_TABLET | Freq: Three times a day (TID) | ORAL | 0 refills | Status: DC | PRN

## 2015-12-13 NOTE — Telephone Encounter (Signed)
Patient requesting refill of  oxyCODONE (ROXICODONE) 15 MG immediate release tablet. She called the other day for this medication and Xanax but Xanax is the only Rx at the front desk. I did not see this medication was requested. The patient's husband is at the front desk for the medications.

## 2015-12-13 NOTE — Telephone Encounter (Signed)
Alprazolam and Oxycodone rx's up front [facility]/fim

## 2015-12-13 NOTE — Telephone Encounter (Signed)
I have spoken with Connie Arnold this morning and advised rx's are up front [facility]/fim

## 2015-12-13 NOTE — Telephone Encounter (Signed)
Rx. awaiting RAS sig/fim 

## 2015-12-13 NOTE — Telephone Encounter (Signed)
Please call patient's husband [phone] when Rx is ready

## 2015-12-13 NOTE — Addendum Note (Signed)
Addended by: Candis Schatz I on: 12/13/2015 10:41 AM   Modules accepted: Orders

## 2015-12-27 ENCOUNTER — Ambulatory Visit: Payer: Medicare Other | Admitting: Neurology

## 2016-01-10 ENCOUNTER — Ambulatory Visit (INDEPENDENT_AMBULATORY_CARE_PROVIDER_SITE_OTHER): Payer: Medicare Other | Admitting: Neurology

## 2016-01-10 ENCOUNTER — Encounter: Payer: Self-pay | Admitting: Neurology

## 2016-01-10 VITALS — BP 110/78 | HR 68 | Resp 18 | Ht 65.0 in | Wt 218.5 lb

## 2016-01-10 DIAGNOSIS — F1190 Opioid use, unspecified, uncomplicated: Secondary | ICD-10-CM

## 2016-01-10 DIAGNOSIS — M542 Cervicalgia: Secondary | ICD-10-CM | POA: Diagnosis not present

## 2016-01-10 DIAGNOSIS — G43709 Chronic migraine without aura, not intractable, without status migrainosus: Secondary | ICD-10-CM

## 2016-01-10 DIAGNOSIS — F329 Major depressive disorder, single episode, unspecified: Secondary | ICD-10-CM

## 2016-01-10 DIAGNOSIS — M329 Systemic lupus erythematosus, unspecified: Secondary | ICD-10-CM | POA: Diagnosis not present

## 2016-01-10 DIAGNOSIS — M797 Fibromyalgia: Secondary | ICD-10-CM | POA: Diagnosis not present

## 2016-01-10 DIAGNOSIS — F32A Depression, unspecified: Secondary | ICD-10-CM

## 2016-01-10 MED ORDER — ALPRAZOLAM 1 MG PO TABS: 1.0000 mg | ORAL_TABLET | Freq: Three times a day (TID) | ORAL | 0 refills | Status: DC | PRN

## 2016-01-10 MED ORDER — DULOXETINE HCL 60 MG PO CPEP
60.0000 mg | ORAL_CAPSULE | Freq: Every day | ORAL | 11 refills | Status: DC
Start: 2016-01-10 — End: 2016-04-15

## 2016-01-10 MED ORDER — OXYCODONE HCL 15 MG PO TABS: 15.0000 mg | ORAL_TABLET | Freq: Three times a day (TID) | ORAL | 0 refills | Status: DC | PRN

## 2016-01-10 NOTE — Progress Notes (Addendum)
[facility]  PATIENT: Connie Arnold DOB: [birth year]     HISTORICAL  CHIEF COMPLAINT:  Chief Complaint  Patient presents with  . Headache    Sts. neck pain and h/a's are "so-so; I have good days and bad days."  Sts. she has h/a's about 5 days per week./fim  . Neck Pain    HISTORY OF PRESENT ILLNESS:  Connie Arnold is a 50 year old woman with chronic migraine headaches and other neurologic issues. Connie Arnold, she has had more shoulder and neck pain and continues to have near daily headache.     Chronic Migraine:    She is experiencing chronic headaches 5-6 days a week (20-24/month).   When present, pain is bilateral and throbbing.   There is often neck pain.    She has photophobia, phonophobia and nausea with headaches.  No vomiting.   Headaches are worse in the afternoons   Moving intensifies the pain.     Neck Pain:   Her neck pain is right > left currently.   Pain is mild to moderate but rarely absent.  She also has pain between her shoulder blades.      Weight:   She is exercising more and doing stretches.   She lost some weight but gained a few pounds the last month  Pain med's:      She is taking 30 mg daily of the baclofen and tid oxycodone 15 mg po tid, alprazolam tid.  She is on tizanidine prn.     She is on topiramate 200 mg po bid (it helped at first, not sure if benefit now).  Mood:   Depression is better on Cymbalta than on the SSRI and she tolerates it well.   Unfortunately, pain did not improve.  She is still apathetic and spends much of day in bed or watching TV.    SLE:   She was diagnosed with SLE in 2006 and has been on Plaquenil x years and more recently Benlysta.        ------------ Chronic Migraine History:   She has had chronic migraine headache with moderate pain, flaring up to severe,  for most of the past couple of years   She is having chronic migraine 30/30 days every month, lasting for more than 4 hours every day.  She has received  some benefit from occipital nerve blocks, trigger point injections and medications but the headaches still persist on a daily basis for past couple years.    She had been on Botox for migraine prophylaxis in the past with benefit. However, with changes in insurance she was unable to continue due to very high co-pay. She is currently on Topamax 200 mg by mouth twice a day and muscle relaxants and opiates.  She has occasional emergency room visits and frequent additional visits to my office for trigger point injections..  She has tried and failed multiple prophylactic medications for her chronic migraine: Antiepileptics:    zonisamide, topiramate, Keppra.   (Due to obesity, Depakote is a poor choice) Anti-depressants  amitriptyline, imipramine Anti-hypertensives:  Diamox Muscle relaxants:  baclofen, tizanidine NSAID's:  She is unable to tolerate chronic anti-inflammatories due to mild renal insufficiency.  She has had multiple visits to ER and to our office for injections when pain is more severe.     ALLERGIES: Allergies  Allergen Reactions  . Nsaids Other (See Comments)    Chronic kidney failure  . Codeine   . Ramipril   .  Imitrex [Sumatriptan] Nausea And Vomiting    HOME MEDICATIONS:  Current Outpatient Prescriptions:  .  ADVAIR DISKUS 250-50 MCG/DOSE AEPB, , Disp: , Rfl:  .  albuterol (PROVENTIL) (2.5 MG/3ML) 0.083% nebulizer solution, , Disp: , Rfl:  .  ALPRAZolam (XANAX) 1 MG tablet, Take 1 tablet (1 mg total) by mouth 3 (three) times daily as needed for anxiety., Disp: 90 tablet, Rfl: 0 .  Azelastine-Fluticasone 137-50 MCG/ACT SUSP, Place into the nose., Disp: , Rfl:  .  baclofen (LIORESAL) 10 MG tablet, 1 or 2 pills po tid, Disp: 540 each, Rfl: 3 .  baclofen (LIORESAL) 10 MG tablet, Take 1 to 2 tablets by  mouth 3 times daily, Disp: 540 tablet, Rfl: 3 .  bupivacaine (MARCAINE PRESERVATIVE FREE) 0.5 % SOLN injection, Instill 15cc into bladder daily, Disp: , Rfl:  .  cetirizine  (ZYRTEC) 10 MG tablet, Take 10 mg by mouth., Disp: , Rfl:  .  diltiazem (MATZIM LA) 180 MG 24 hr tablet, Take 180 mg by mouth daily., Disp: , Rfl:  .  DULoxetine (CYMBALTA) 60 MG capsule, Take 1 capsule (60 mg total) by mouth daily., Disp: 30 capsule, Rfl: 11 .  fluconazole (DIFLUCAN) 150 MG tablet, Take 150 mg by mouth., Disp: , Rfl:  .  furosemide (LASIX) 40 MG tablet, , Disp: , Rfl:  .  glucose blood test strip, , Disp: , Rfl:  .  heparin 16109 UNIT/ML injection, Instill 4 cc into bladder 2x daily, Disp: , Rfl:  .  hydroxychloroquine (PLAQUENIL) 200 MG tablet, Take 1 tablet (200 mg total) by mouth 2 (two) times daily., Disp: 30 tablet, Rfl: 0 .  hyoscyamine (LEVSIN SL) 0.125 MG SL tablet, Take 0.125 mg by mouth., Disp: , Rfl:  .  ipratropium-albuterol (DUONEB) 0.5-2.5 (3) MG/3ML SOLN, 3 mLs., Disp: , Rfl:  .  lactulose, encephalopathy, (CHRONULAC) 10 GM/15ML SOLN, , Disp: , Rfl:  .  Montelukast Sodium (SINGULAIR PO), Take by mouth., Disp: , Rfl:  .  oxyCODONE (ROXICODONE) 15 MG immediate release tablet, Take 1 tablet (15 mg total) by mouth every 8 (eight) hours as needed for pain., Disp: 90 tablet, Rfl: 0 .  pantoprazole (PROTONIX) 40 MG tablet, , Disp: , Rfl:  .  promethazine (PHENERGAN) 50 MG/ML injection, Frequency:   Dosage:0.0     Instructions:  Note:, Disp: , Rfl:  .  sitaGLIPtin (JANUVIA) 100 MG tablet, Take 100 mg by mouth daily., Disp: , Rfl:  .  tiZANidine (ZANAFLEX) 4 MG tablet, Take 1 tablet (4 mg total) by mouth 3 (three) times daily., Disp: 90 tablet, Rfl: 2 .  topiramate (TOPAMAX) 200 MG tablet, Take 1 tablet (200 mg total) by mouth 2 (two) times daily., Disp: 180 tablet, Rfl: 4 .  traZODone (DESYREL) 100 MG tablet, Take 1 tablet (100 mg total) by mouth at bedtime., Disp: 90 tablet, Rfl: 3 .  triamcinolone (NASACORT) 55 MCG/ACT AERO nasal inhaler, Place 2 sprays into the nose., Disp: , Rfl:  .  trimethoprim (TRIMPEX) 100 MG tablet, Take 1 tablet by mouth  daily, Disp: , Rfl:  .   Vitamin D, Ergocalciferol, (DRISDOL) 50000 UNITS CAPS capsule, Take 50,000 Units by mouth., Disp: , Rfl:  .  zolpidem (AMBIEN) 10 MG tablet, Frequency:   Dosage:0   MG  Instructions:  Note:Take 1 po nightly, Disp: , Rfl:   PAST MEDICAL HISTORY: Past Medical History:  Diagnosis Date  . Asthma   . Carpal tunnel syndrome 09/29/2014  . Chronic back pain   .  Chronic kidney disease   . Diabetes mellitus without complication (HCC)   . Fatty infiltration of liver 03/17/2015  . Fibromyalgia   . Hypertension   . IBS (irritable bowel syndrome)   . Interstitial cystitis   . Lupus (HCC)   . Lupus (systemic lupus erythematosus) (HCC)   . Migraines   . Pseudotumor cerebri     PAST SURGICAL HISTORY: Past Surgical History:  Procedure Laterality Date  . ABDOMINAL HYSTERECTOMY    . APPENDECTOMY    . CHOLECYSTECTOMY    . CYSTOSCOPY      FAMILY HISTORY: Family History  Problem Relation Age of Onset  . Graves' disease Mother   . Heart disease Father   . Prostate cancer Father   . Healthy Brother   . Healthy Maternal Aunt   . Healthy Maternal Uncle     SOCIAL HISTORY:  Social History   Social History  . Marital status: Married    Spouse name: N/A  . Number of children: N/A  . Years of education: N/A   Occupational History  . Not on file.   Social History Main Topics  . Smoking status: Never Smoker  . Smokeless tobacco: Not on file  . Alcohol use No  . Drug use: No  . Sexual activity: Not on file   Other Topics Concern  . Not on file   Social History Narrative  . No narrative on file     PHYSICAL EXAM  Vitals:   01/10/16 0929  BP: 110/78  Pulse: 68  Resp: 18  Weight: 218 lb 8 oz (99.1 kg)  Height: 5\' 5"  (1.651 m)    Body mass index is 36.36 kg/m.   General: The patient is an obese woman in no distress.  Neck: The neck is supple.  The neck is tender at the right > left splenius capitis muscles,  lower cervical paraspinal, upper thoracic paraspinals,  rhomboid muscles.     Musculoskeletal:  Mild shoulder bursa tenderness. Good range of motion in the shoulder.  Neurologic Exam  Mental status: The patient is alert and oriented x 3 at the time of the examination. The patient has apparent normal recent and remote memory, with an apparently normal attention span and concentration ability.   Speech is normal.  Cranial nerves: Extraocular movements are full.     Facial symmetry is present.  Facial strength is normal.  Trapezius and sternocleidomastoid strength is normal. No dysarthria is noted.    Motor:  Muscle bulk is normal.   Tone is normal. Strength is  5 / 5 in limbs  Sensory: Sensory testing shows symmetric touch sensation proximally in limbs bur she reports reduced sensation in right hand  Gait and station: Station is normal.   Gait is normal.   Reflexes: Deep tendon reflexes are symmetric and normal bilaterally.     DIAGNOSTIC DATA (LABS, IMAGING, TESTING) - I reviewed patient records, labs, notes, testing and imaging myself where available.  Lab Results  Component Value Date   WBC 5.9 02/19/2015   HGB 12.7 02/19/2015   HCT 39.4 02/19/2015   MCV 89.5 02/19/2015   PLT 255 02/19/2015        ASSESSMENT AND PLAN  Chronic migraine w/o aura w/o status migrainosus, not intractable  Neck pain  Fibromyalgia  Depression  Systemic lupus erythematosus (HCC)    1.   Continue current medications. Renew scripts. 2.   TPI bilateral splenius capitis, trapezius lower cervical paraspinal muscles with 80 mg Depo-Medrol in [place] using  sterile technique.  She tolerated the procedure well 3.    She has not shown any drug seeking behavior. The [place] controlled substance database was reviewed and she is not getting opiates or benzodiazepines from other doctors. She is not putting prescriptions early. She gets zolpidem from Dr. Jeannine KittenFarah.   Urine drug test today 4.    Return to see us in 4 months or call sooner if new or  worsening neurologic symptoms.   Connie Mchale A. Epimenio FootSater, MD, PhD 01/10/2016, 9:39 AM Certified in Neurology, Clinical Neurophysiology, Sleep Medicine, Pain Medicine and Neuroimaging  [facility] [address], [address] [place], [place] [address] [phone].

## 2016-01-15 LAB — URINE DRUG PANEL 7
Amphetamines, Urine: NEGATIVE ng/mL
BENZODIAZEPINE QUANT UR: NEGATIVE
Barbiturate Quant, Ur: NEGATIVE ng/mL
CANNABINOID QUANT UR: NEGATIVE ng/mL
COCAINE (METAB.): NEGATIVE ng/mL
OPIATE QUANT UR: NEGATIVE ng/mL
PCP QUANT UR: NEGATIVE ng/mL

## 2016-01-19 DIAGNOSIS — J301 Allergic rhinitis due to pollen: Secondary | ICD-10-CM | POA: Insufficient documentation

## 2016-02-12 ENCOUNTER — Telehealth: Payer: Self-pay | Admitting: Neurology

## 2016-02-12 MED ORDER — OXYCODONE HCL 15 MG PO TABS: 15.0000 mg | ORAL_TABLET | Freq: Three times a day (TID) | ORAL | 0 refills | Status: DC | PRN

## 2016-02-12 MED ORDER — ALPRAZOLAM 1 MG PO TABS
1.0000 mg | ORAL_TABLET | Freq: Three times a day (TID) | ORAL | 0 refills | Status: DC | PRN
Start: 2016-02-12 — End: 2016-03-12

## 2016-02-12 NOTE — Telephone Encounter (Signed)
Patient called to request refills of ALPRAZolam (XANAX) 1 MG tablet and oxyCODONE (ROXICODONE) 15 MG immediate release tablet. °

## 2016-02-12 NOTE — Telephone Encounter (Signed)
Rx's awaiting RAS sig/fim 

## 2016-02-12 NOTE — Telephone Encounter (Signed)
Xanax and Oxycodone rx's up front [facility]/fim 

## 2016-03-12 ENCOUNTER — Telehealth: Payer: Self-pay | Admitting: Neurology

## 2016-03-12 MED ORDER — OXYCODONE HCL 15 MG PO TABS: 15.0000 mg | ORAL_TABLET | Freq: Three times a day (TID) | ORAL | 0 refills | Status: DC | PRN

## 2016-03-12 MED ORDER — ALPRAZOLAM 1 MG PO TABS: 1.0000 mg | ORAL_TABLET | Freq: Three times a day (TID) | ORAL | 0 refills | Status: DC | PRN

## 2016-03-12 NOTE — Telephone Encounter (Signed)
Patient called to request refills of oxyCODONE (ROXICODONE) 15 MG immediate release tablet and ALPRAZolam (XANAX) 1 MG tablet

## 2016-03-12 NOTE — Telephone Encounter (Signed)
Rx.'s awaiting YY sig, as RAS is ooo this week/fim 

## 2016-03-12 NOTE — Telephone Encounter (Signed)
Xanax and Oxycodone rx's up front [facility]/fim 

## 2016-03-12 NOTE — Addendum Note (Signed)
Addended by: Candis Schatz I on: 03/12/2016 09:39 AM   Modules accepted: Orders

## 2016-03-28 DIAGNOSIS — R531 Weakness: Secondary | ICD-10-CM | POA: Insufficient documentation

## 2016-04-03 ENCOUNTER — Telehealth: Payer: Self-pay | Admitting: Neurology

## 2016-04-03 NOTE — Telephone Encounter (Signed)
Spoke with Connie Arnold .   She sts. she was seen in the ER for h/a, visual disturbance, numbness in arm.  MI and CVA were r/o.  Appt. given with RAS 04-15-16 at 1pm.   If sooner appt. opens up, will let her know/fim

## 2016-04-03 NOTE — Telephone Encounter (Signed)
Pt called to advise she has been in the hospital twice with dx of complicated migraine. She is an appt soon. Dr Epimenio FootSater is booked into December

## 2016-04-09 ENCOUNTER — Telehealth: Payer: Self-pay | Admitting: Neurology

## 2016-04-09 MED ORDER — OXYCODONE HCL 15 MG PO TABS: 15.0000 mg | ORAL_TABLET | Freq: Three times a day (TID) | ORAL | 0 refills | Status: DC | PRN

## 2016-04-09 MED ORDER — ALPRAZOLAM 1 MG PO TABS: 1.0000 mg | ORAL_TABLET | Freq: Three times a day (TID) | ORAL | 0 refills | Status: DC | PRN

## 2016-04-09 NOTE — Telephone Encounter (Signed)
Rx's awaiting RAS sig/fim 

## 2016-04-09 NOTE — Telephone Encounter (Signed)
Rx's up front [facility]/fim 

## 2016-04-09 NOTE — Addendum Note (Signed)
Addended by: Candis Schatz I on: 04/09/2016 11:38 AM   Modules accepted: Orders

## 2016-04-09 NOTE — Telephone Encounter (Signed)
Patient requesting refill of ALPRAZolam (XANAX) 1 MG tablet and oxyCODONE (ROXICODONE) 15 MG immediate release tablet. I advised the Rx's will be ready in 24 hours unless the nurse advises otherwise.

## 2016-04-15 ENCOUNTER — Encounter: Payer: Self-pay | Admitting: Neurology

## 2016-04-15 ENCOUNTER — Ambulatory Visit (INDEPENDENT_AMBULATORY_CARE_PROVIDER_SITE_OTHER): Payer: Medicare Other | Admitting: Neurology

## 2016-04-15 DIAGNOSIS — M542 Cervicalgia: Secondary | ICD-10-CM | POA: Diagnosis not present

## 2016-04-15 DIAGNOSIS — F332 Major depressive disorder, recurrent severe without psychotic features: Secondary | ICD-10-CM

## 2016-04-15 DIAGNOSIS — R202 Paresthesia of skin: Secondary | ICD-10-CM

## 2016-04-15 DIAGNOSIS — G459 Transient cerebral ischemic attack, unspecified: Secondary | ICD-10-CM | POA: Insufficient documentation

## 2016-04-15 DIAGNOSIS — G43709 Chronic migraine without aura, not intractable, without status migrainosus: Secondary | ICD-10-CM

## 2016-04-15 MED ORDER — TOPIRAMATE 200 MG PO TABS: 200.0000 mg | ORAL_TABLET | Freq: Two times a day (BID) | ORAL | 4 refills | Status: DC

## 2016-04-15 MED ORDER — ALPRAZOLAM 1 MG PO TABS: 1.0000 mg | ORAL_TABLET | Freq: Three times a day (TID) | ORAL | 0 refills | Status: DC | PRN

## 2016-04-15 MED ORDER — DULOXETINE HCL 60 MG PO CPEP: 60.0000 mg | ORAL_CAPSULE | Freq: Every day | ORAL | 4 refills | Status: DC

## 2016-04-15 MED ORDER — OXYCODONE HCL 15 MG PO TABS: 15.0000 mg | ORAL_TABLET | Freq: Three times a day (TID) | ORAL | 0 refills | Status: DC | PRN

## 2016-04-15 NOTE — Progress Notes (Signed)
[facility]  PATIENT: Connie Arnold DOB: 11-08-1965     HISTORICAL  CHIEF COMPLAINT:  Chief Complaint  Patient presents with  . Headaches    Sts. h/a's are same frequency, worse severity over the last month.  Seen in the ER  to be r/o for TIA/fim    HISTORY OF PRESENT ILLNESS:  Connie Arnold is a 50 year old woman with chronic migraine headaches and other neurologic issues. Connie Arnold, she has had more shoulder and neck pain and continues to have near daily headache.     Two recent hospitalizations:  She had right facial drooping and numbness in her right arm 03/27/16 (saw PCP).    She was sent to [facility] ED and had CT, MRI, MRA (head and neck).   She was first told she had a stroke, then that she did not and then that she had a TIA.   She was discharged the next day but returned to the ED 03/29/16 and had CT/CTA but no evidence of stroke or major stenosis (possible mild left PCA).   She was told she likely had a complicated migraine headache.   Echo was normal.  She was discharged the next day.   Both times the fibrinogen was elevated but other coag labs were fine. ANA was positive (speckled).  Homocysteine was normal.   Anti-cardiolipin was normal..   She continues to feel the right arm is mildy heavy but better than 03/27/16.     Her headache is still present and associated with neck pain, similar to pain in the past.    All available notes and reports and studies from [facility] were reviewed.  Chronic Migraine:    She experiences chronic headaches 6 days a week (24/month).   When present, pain is bilateral and throbbing.   There is often neck pain.    She has photophobia, phonophobia and nausea with headaches.  No vomiting.   Headaches are worse in the afternoons   Moving intensifies the pain.     Neck Pain:   Her neck pain is right > left.   Pain is  moderate today.      Pain med's:      She is taking 30 mg daily of the baclofen and tid oxycodone 15 mg po tid,  alprazolam tid.  She is on tizanidine prn.     She is on topiramate 200 mg po bid (it helped at first, not sure if benefit now).  Mood:   Depression is better on Cymbalta than on the SSRI and she tolerates it well.     She is still a little apathetic  SLE:   She was diagnosed with SLE in 2006 and has been on Plaquenil x years and more recently Benlysta.    Her Complements were normal 11/17 but ANA is positive (speckled)    ------------ Chronic Migraine History:   She has had chronic migraine headache with moderate pain, flaring up to severe,  for most of the past couple of years   She is having chronic migraine 30/30 days every month, lasting for more than 4 hours every day.  She has received some benefit from occipital nerve blocks, trigger point injections and medications but the headaches still persist on a daily basis for past couple years.    She had been on Botox for migraine prophylaxis in the past with benefit. However, with changes in insurance she was unable to continue due to very high co-pay. She is  currently on Topamax 200 mg by mouth twice a day and muscle relaxants and opiates.  She has occasional emergency room visits and frequent additional visits to my office for trigger point injections..  She has tried and failed multiple prophylactic medications for her chronic migraine: Antiepileptics:    zonisamide, topiramate, Keppra.   (Due to obesity, Depakote is a poor choice) Anti-depressants  amitriptyline, imipramine Anti-hypertensives:  Diamox Muscle relaxants:  baclofen, tizanidine NSAID's:  She is unable to tolerate chronic anti-inflammatories due to mild renal insufficiency.  She has had multiple visits to ER and to our office for injections when pain is more severe.     ALLERGIES: Allergies  Allergen Reactions  . Nsaids Other (See Comments)    Chronic kidney failure  . Codeine   . Ramipril   . Imitrex [Sumatriptan] Nausea And Vomiting    HOME MEDICATIONS:  Current  Outpatient Prescriptions:  .  ADVAIR DISKUS 250-50 MCG/DOSE AEPB, , Disp: , Rfl:  .  albuterol (PROVENTIL) (2.5 MG/3ML) 0.083% nebulizer solution, , Disp: , Rfl:  .  ALPRAZolam (XANAX) 1 MG tablet, Take 1 tablet (1 mg total) by mouth 3 (three) times daily as needed for anxiety., Disp: 90 tablet, Rfl: 0 .  Azelastine-Fluticasone 137-50 MCG/ACT SUSP, Place into the nose., Disp: , Rfl:  .  baclofen (LIORESAL) 10 MG tablet, 1 or 2 pills po tid, Disp: 540 each, Rfl: 3 .  baclofen (LIORESAL) 10 MG tablet, Take 1 to 2 tablets by  mouth 3 times daily, Disp: 540 tablet, Rfl: 3 .  bupivacaine (MARCAINE PRESERVATIVE FREE) 0.5 % SOLN injection, Instill 15cc into bladder daily, Disp: , Rfl:  .  cetirizine (ZYRTEC) 10 MG tablet, Take 10 mg by mouth., Disp: , Rfl:  .  diltiazem (MATZIM LA) 180 MG 24 hr tablet, Take 180 mg by mouth daily., Disp: , Rfl:  .  DULoxetine (CYMBALTA) 60 MG capsule, Take 1 capsule (60 mg total) by mouth daily., Disp: 90 capsule, Rfl: 4 .  fluconazole (DIFLUCAN) 150 MG tablet, Take 150 mg by mouth., Disp: , Rfl:  .  furosemide (LASIX) 40 MG tablet, , Disp: , Rfl:  .  glucose blood test strip, , Disp: , Rfl:  .  heparin 16109 UNIT/ML injection, Instill 4 cc into bladder 2x daily, Disp: , Rfl:  .  hydroxychloroquine (PLAQUENIL) 200 MG tablet, Take 1 tablet (200 mg total) by mouth 2 (two) times daily., Disp: 30 tablet, Rfl: 0 .  hyoscyamine (LEVSIN SL) 0.125 MG SL tablet, Take 0.125 mg by mouth., Disp: , Rfl:  .  ipratropium-albuterol (DUONEB) 0.5-2.5 (3) MG/3ML SOLN, 3 mLs., Disp: , Rfl:  .  lactulose, encephalopathy, (CHRONULAC) 10 GM/15ML SOLN, , Disp: , Rfl:  .  Montelukast Sodium (SINGULAIR PO), Take by mouth., Disp: , Rfl:  .  oxyCODONE (ROXICODONE) 15 MG immediate release tablet, Take 1 tablet (15 mg total) by mouth every 8 (eight) hours as needed for pain., Disp: 90 tablet, Rfl: 0 .  pantoprazole (PROTONIX) 40 MG tablet, , Disp: , Rfl:  .  promethazine (PHENERGAN) 50 MG/ML  injection, Frequency:   Dosage:0.0     Instructions:  Note:, Disp: , Rfl:  .  sitaGLIPtin (JANUVIA) 100 MG tablet, Take 100 mg by mouth daily., Disp: , Rfl:  .  tiZANidine (ZANAFLEX) 4 MG tablet, Take 1 tablet (4 mg total) by mouth 3 (three) times daily., Disp: 90 tablet, Rfl: 2 .  topiramate (TOPAMAX) 200 MG tablet, Take 1 tablet (200 mg total) by mouth 2 (  two) times daily., Disp: 180 tablet, Rfl: 4 .  traZODone (DESYREL) 100 MG tablet, Take 1 tablet (100 mg total) by mouth at bedtime., Disp: 90 tablet, Rfl: 3 .  triamcinolone (NASACORT) 55 MCG/ACT AERO nasal inhaler, Place 2 sprays into the nose., Disp: , Rfl:  .  trimethoprim (TRIMPEX) 100 MG tablet, Take 1 tablet by mouth  daily, Disp: , Rfl:  .  Vitamin D, Ergocalciferol, (DRISDOL) 50000 UNITS CAPS capsule, Take 50,000 Units by mouth., Disp: , Rfl:  .  zolpidem (AMBIEN) 10 MG tablet, Frequency:   Dosage:0   MG  Instructions:  Note:Take 1 po nightly, Disp: , Rfl:  .  propranolol ER (INDERAL LA) 60 MG 24 hr capsule, , Disp: , Rfl:   PAST MEDICAL HISTORY: Past Medical History:  Diagnosis Date  . Asthma   . Carpal tunnel syndrome 09/29/2014  . Chronic back pain   . Chronic kidney disease   . Diabetes mellitus without complication (HCC)   . Fatty infiltration of liver 03/17/2015  . Fibromyalgia   . Hypertension   . IBS (irritable bowel syndrome)   . Interstitial cystitis   . Lupus   . Lupus (systemic lupus erythematosus) (HCC)   . Migraines   . Pseudotumor cerebri     PAST SURGICAL HISTORY: Past Surgical History:  Procedure Laterality Date  . ABDOMINAL HYSTERECTOMY    . APPENDECTOMY    . CHOLECYSTECTOMY    . CYSTOSCOPY      FAMILY HISTORY: Family History  Problem Relation Age of Onset  . Graves' disease Mother   . Heart disease Father   . Prostate cancer Father   . Healthy Brother   . Healthy Maternal Aunt   . Healthy Maternal Uncle     SOCIAL HISTORY:  Social History   Social History  . Marital status: Married      Spouse name: N/A  . Number of children: N/A  . Years of education: N/A   Occupational History  . Not on file.   Social History Main Topics  . Smoking status: Never Smoker  . Smokeless tobacco: Not on file  . Alcohol use No  . Drug use: No  . Sexual activity: Not on file   Other Topics Concern  . Not on file   Social History Narrative  . No narrative on file     PHYSICAL EXAM  There were no vitals filed for this visit.  There is no height or weight on file to calculate BMI.   General: The patient is an obese woman in no distress.  Neck: The neck is supple.  The neck is tender at the right > left splenius capitis muscles,  lower cervical paraspinal, upper thoracic paraspinals, rhomboid muscles.   Okay range of motion in neck.  Musculoskeletal:  Mild right shoulder bursa tenderness but good range of motion in the shoulder.  Neurologic Exam  Mental status: The patient is alert and oriented x 3 at the time of the examination. The patient has apparent normal recent and remote memory, with an apparently normal attention span and concentration ability.   Speech is normal.  Cranial nerves: Extraocular movements are full.     Facial symmetry is present.  Facial strength is normal.  Trapezius and sternocleidomastoid strength is normal. No dysarthria is noted.    Motor:  Muscle bulk is normal.   Tone is normal. Strength is  5 / 5 in limbs  Sensory: Sensory testing shows symmetric touch sensation proximally in limbs bur she  reports reduced sensation in right hand  Gait and station: Station is normal.   Gait is normal.   Reflexes: Deep tendon reflexes are symmetric and normal bilaterally.     DIAGNOSTIC DATA (LABS, IMAGING, TESTING) - I reviewed patient records, labs, notes, testing and imaging myself where available.  Lab Results  Component Value Date   WBC 5.9 02/19/2015   HGB 12.7 02/19/2015   HCT 39.4 02/19/2015   MCV 89.5 02/19/2015   PLT 255 02/19/2015         ASSESSMENT AND PLAN  Chronic migraine w/o aura w/o status migrainosus, not intractable  Neck pain  Severe episode of recurrent major depressive disorder, without psychotic features (HCC)  Tingling  Transient cerebral ischemia, unspecified type    1.   Unclear if event 03/27/2016 was a complicated migraine or transient ischemic attack. Elevated fibrinogen with other labs and studies normal is not too meningful.   Episode 03/29/2016 was more likely a migraine. Continue current medications. Renew scripts. 2.   TPI bilateral splenius capitis, trapezius,  lower C6C7 and T1T2 paraspinal muscles with 80 mg Depo-Medrol in [place] using sterile technique.  She tolerated the procedure well 3.    Medications will be renewed.    4.    Return to see us in 4 months or call sooner if new or worsening neurologic symptoms.   Richard A. Epimenio FootSater, MD, PhD 04/15/2016, 2:05 PM Certified in Neurology, Clinical Neurophysiology, Sleep Medicine, Pain Medicine and Neuroimaging  [facility] [address], [address] [place], [place] [address] [phone].

## 2016-05-07 ENCOUNTER — Ambulatory Visit: Payer: Self-pay | Admitting: Neurology

## 2016-06-10 ENCOUNTER — Telehealth: Payer: Self-pay | Admitting: Neurology

## 2016-06-10 MED ORDER — BACLOFEN 10 MG PO TABS: ORAL_TABLET | ORAL | 3 refills | Status: DC

## 2016-06-10 NOTE — Telephone Encounter (Signed)
Baclofen rx. escribed to [facility] as requested/fim

## 2016-06-10 NOTE — Telephone Encounter (Signed)
Pt request refill for baclofen (LIORESAL) 10 MG tablet sent to [facility]. Pt has enough medication for about 1 week

## 2016-06-12 ENCOUNTER — Telehealth: Payer: Self-pay | Admitting: Neurology

## 2016-06-12 MED ORDER — ALPRAZOLAM 1 MG PO TABS: 1.0000 mg | ORAL_TABLET | Freq: Three times a day (TID) | ORAL | 0 refills | Status: DC | PRN

## 2016-06-12 MED ORDER — OXYCODONE HCL 15 MG PO TABS: 15.0000 mg | ORAL_TABLET | Freq: Three times a day (TID) | ORAL | 0 refills | Status: DC | PRN

## 2016-06-12 NOTE — Telephone Encounter (Signed)
Rx's awaiting RAS sig/fim 

## 2016-06-12 NOTE — Telephone Encounter (Signed)
Patient requesting refill for ALPRAZolam (XANAX) 1 MG tablet and oxyCODONE (ROXICODONE) 15 MG immediate release tablet

## 2016-06-12 NOTE — Addendum Note (Signed)
Addended by: Candis Schatz I on: 06/12/2016 02:13 PM   Modules accepted: Orders

## 2016-06-12 NOTE — Telephone Encounter (Signed)
Rx's up front [facility]/fim 

## 2016-06-27 DIAGNOSIS — F3341 Major depressive disorder, recurrent, in partial remission: Secondary | ICD-10-CM | POA: Insufficient documentation

## 2016-07-10 ENCOUNTER — Telehealth: Payer: Self-pay | Admitting: Neurology

## 2016-07-10 MED ORDER — ALPRAZOLAM 1 MG PO TABS: 1.0000 mg | ORAL_TABLET | Freq: Three times a day (TID) | ORAL | 0 refills | Status: DC | PRN

## 2016-07-10 MED ORDER — OXYCODONE HCL 15 MG PO TABS: 15.0000 mg | ORAL_TABLET | Freq: Three times a day (TID) | ORAL | 0 refills | Status: DC | PRN

## 2016-07-10 NOTE — Telephone Encounter (Signed)
Patient requesting refill of  ALPRAZolam (XANAX) 1 MG tablet and oxyCODONE (ROXICODONE) 15 MG immediate release tablet. ° ° °

## 2016-07-10 NOTE — Telephone Encounter (Signed)
Rx's up front [facility]/fim 

## 2016-07-10 NOTE — Telephone Encounter (Signed)
Rx's awaiting RAS sig/fim 

## 2016-07-10 NOTE — Addendum Note (Signed)
Addended by: Candis Schatz I on: 07/10/2016 11:34 AM   Modules accepted: Orders

## 2016-08-07 ENCOUNTER — Telehealth: Payer: Self-pay | Admitting: Neurology

## 2016-08-07 MED ORDER — OXYCODONE HCL 15 MG PO TABS: 15.0000 mg | ORAL_TABLET | Freq: Three times a day (TID) | ORAL | 0 refills | Status: DC | PRN

## 2016-08-07 MED ORDER — ALPRAZOLAM 1 MG PO TABS: 1.0000 mg | ORAL_TABLET | Freq: Three times a day (TID) | ORAL | 0 refills | Status: DC | PRN

## 2016-08-07 NOTE — Telephone Encounter (Signed)
Rx's awaiting RAS sig/fim 

## 2016-08-07 NOTE — Telephone Encounter (Signed)
Rx. up front [facility]/fim 

## 2016-08-07 NOTE — Addendum Note (Signed)
Addended by: Candis Schatz I on: 08/07/2016 03:47 PM   Modules accepted: Orders

## 2016-08-07 NOTE — Telephone Encounter (Signed)
Patient called office requesting refill for oxyCODONE (ROXICODONE) 15 MG immediate release tablet and ALPRAZolam (XANAX) 1 MG tablet

## 2016-08-12 ENCOUNTER — Telehealth: Payer: Self-pay | Admitting: Neurology

## 2016-08-12 IMAGING — XA DG FLUORO GUIDE LUMBAR PUNCTURE
1 series · 2 of 2 positions shown · non-contrast
Comparison: none

CLINICAL DATA: Pseudotumor cerebri.  Migraine headaches.

[Series 1: ortho standard · 2 of 2 slices shown]
[im 1/2]
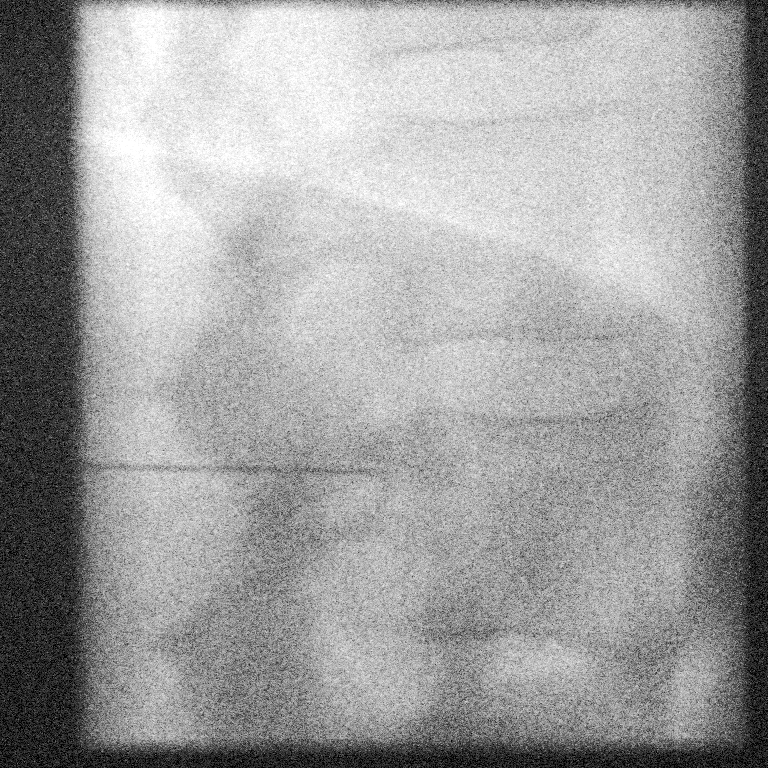
[im 2/2]
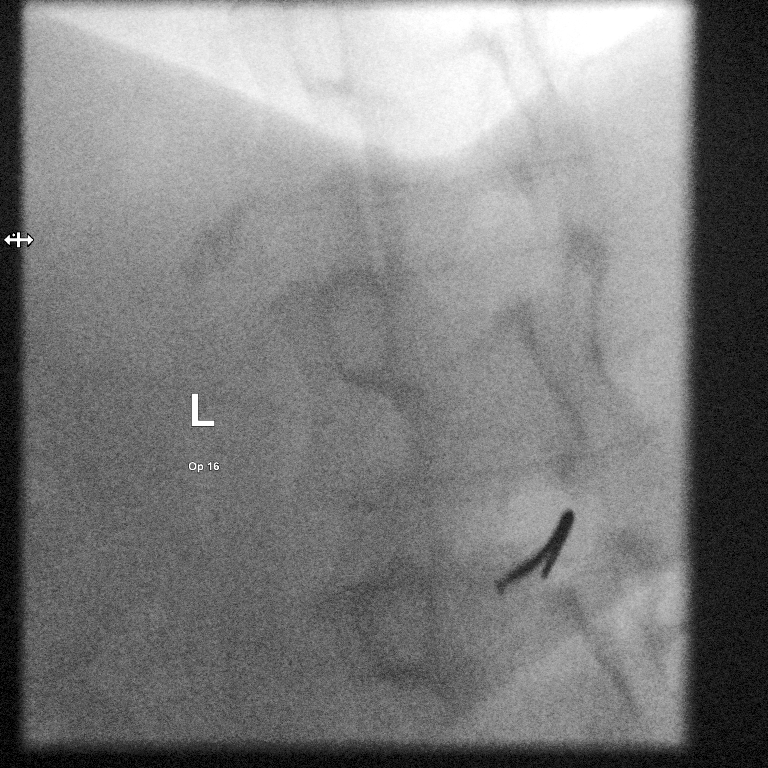

[2 of 2 positions shown; findings below may reference images not displayed]

EXAM:
DIAGNOSTIC LUMBAR PUNCTURE UNDER FLUOROSCOPIC GUIDANCE

FLUOROSCOPY TIME:  Radiation Exposure Index (as provided by the
fluoroscopic device): Dose: 4[REDACTED]

PROCEDURE:
Informed consent was obtained from the patient prior to the
procedure, including potential complications of headache, allergy,
and pain. With the patient prone, the lower back was prepped with
Betadine.

I initially attempted the procedure in the lateral decubitus
position. Because of the patient's obesity it was impossible to
obtain access with a 6 inch needle. Rather than the aborting the
procedure completely, the patient was positioned prone and at the
same level access was obtained. Subsequently, the patient was turned
lateral within needle in place for measurement of CSF pressure. When
the thecal sac was reached, the hub of the 6 inch needle indented
the subcutaneous fat.

1% Lidocaine was used for local anesthesia. Lumbar puncture was
performed at the L2-L3 level using a 6 inch gauge needle with return
of clear CSF with an opening pressure of 16 cm water. 5.5 ml of CSF
were obtained for laboratory studies. The patient tolerated the
procedure well and there were no apparent complications. Closing
pressure was 13 cm of water.
IMPRESSION: 1. Technically difficult (due to obesity) but successful lumbar
puncture.
2. Normal opening pressure for body habitus at 16 cm of water.
3. Closing pressure of 13 cm of water after harvesting 5.5 mL of
cerebrospinal fluid.

## 2016-08-12 NOTE — Telephone Encounter (Signed)
Husband called from [facility] in [place] , having trouble getting wife's pain meds refilled. The computer generated prescription for Oxycodone was not signed.  I couldn't help with that. Please call today. CD

## 2016-08-12 NOTE — Telephone Encounter (Signed)
Pt. has an appt. with RAS in the morning, and can address rx. at that time/fim

## 2016-08-13 ENCOUNTER — Ambulatory Visit: Payer: Self-pay | Admitting: Neurology

## 2016-08-15 ENCOUNTER — Encounter: Payer: Self-pay | Admitting: Neurology

## 2016-08-22 ENCOUNTER — Encounter: Payer: Self-pay | Admitting: Neurology

## 2016-08-22 ENCOUNTER — Ambulatory Visit (INDEPENDENT_AMBULATORY_CARE_PROVIDER_SITE_OTHER): Payer: Medicare Other | Admitting: Neurology

## 2016-08-22 VITALS — BP 115/76 | HR 61 | Resp 18 | Ht 65.0 in | Wt 239.5 lb

## 2016-08-22 DIAGNOSIS — M542 Cervicalgia: Secondary | ICD-10-CM

## 2016-08-22 DIAGNOSIS — G4700 Insomnia, unspecified: Secondary | ICD-10-CM

## 2016-08-22 DIAGNOSIS — G43709 Chronic migraine without aura, not intractable, without status migrainosus: Secondary | ICD-10-CM

## 2016-08-22 DIAGNOSIS — F329 Major depressive disorder, single episode, unspecified: Secondary | ICD-10-CM | POA: Diagnosis not present

## 2016-08-22 DIAGNOSIS — F32A Depression, unspecified: Secondary | ICD-10-CM

## 2016-08-22 MED ORDER — TRAZODONE HCL 100 MG PO TABS: 100.0000 mg | ORAL_TABLET | Freq: Every day | ORAL | 3 refills | Status: DC

## 2016-08-22 MED ORDER — OXYCODONE HCL 15 MG PO TABS: 15.0000 mg | ORAL_TABLET | Freq: Three times a day (TID) | ORAL | 0 refills | Status: DC | PRN

## 2016-08-22 MED ORDER — LEVETIRACETAM 750 MG PO TABS: 750.0000 mg | ORAL_TABLET | Freq: Two times a day (BID) | ORAL | 3 refills | Status: DC

## 2016-08-22 MED ORDER — ALPRAZOLAM 1 MG PO TABS: 1.0000 mg | ORAL_TABLET | Freq: Three times a day (TID) | ORAL | 0 refills | Status: DC | PRN

## 2016-08-22 NOTE — Progress Notes (Signed)
[facility]  PATIENT: Connie Arnold DOB: 11-19-1965     HISTORICAL  CHIEF COMPLAINT:  Chief Complaint  Patient presents with  . Headaches    Sts. h/a's are same frequency, same severity, but now having more neck pain./fim    HISTORY OF PRESENT ILLNESS:  Connie Arnold is a 51 year old woman with chronic migraine headaches and other neurologic issues.   HA/neck pain:   She has had a severe headache for the past 5 days. Current pain is in the neck and head, right > left.     Pain is worse with movements and better with bedrest.    She gets nausea but not vomiting.   She notes photo and phonophobia.    She takes 15 mg po tid oxycodone.    It takes the edge off but does not eliminate the pain.   Topamax 200 mg poi bid, tizanidine and baclofen have only  helped a little bit.      Her headaches have done best on Botox in the past. Unfortunately, due to very high co-pays was unable to go back on in the past.   Chronic Migraine:    Over the past month, she has been experiencing headaches almost every day (28/30).   The previous few months she was experiencing headaches at least 24/30 days. Headaches last no more than 4 hours a day.    When present, pain is bilateral and throbbing.   There is often neck pain.    She has photophobia, phonophobia and nausea with headaches.  No vomiting.   Headaches are worse in the afternoons   Moving intensifies the pain.     Neck Pain:   Her neck pain is right > left.  In the past, occipital nerve blocks have helped the neck pain and often the headaches for at least a couple weeks but then the pain returns.  Pain med's:      She is taking 30 mg daily of the baclofen and tid oxycodone 15 mg po tid, alprazolam tid.  She is on tizanidine prn.     She is on topiramate 200 mg po bid (it helped at first, not sure if benefit now).  Mood:   Depression is better on Cymbalta than on the SSRI and she tolerates it well.     She is still a little  apathetic  SLE:   She was diagnosed with SLE in 2006 and has been on Plaquenil x years and more recently Benlysta.    Her Complements were normal 11/17 but ANA is positive (speckled)    ------------ Chronic Migraine History:   She has had chronic migraine headache with moderate pain, flaring up to severe,  for most of the past couple of years   She is having chronic migraine 30/30 days every month, lasting for more than 4 hours every day.  She has received some benefit from occipital nerve blocks, trigger point injections and medications but the headaches still persist on a daily basis for past couple years.    She had been on Botox for migraine prophylaxis in the past with benefit. However, with changes in insurance she was unable to continue due to very high co-pay. She is currently on Topamax 200 mg by mouth twice a day and muscle relaxants and opiates.  She has occasional emergency room visits and frequent additional visits to my office for trigger point injections..  She has tried and failed multiple prophylactic medications for her chronic migraine: Antiepileptics:  zonisamide, topiramate, Keppra.   (Due to obesity, Depakote is a poor choice) Anti-depressants  amitriptyline, imipramine Anti-hypertensives:  Diamox Muscle relaxants:  baclofen, tizanidine NSAID's:  She is unable to tolerate chronic anti-inflammatories due to mild renal insufficiency.  She has had multiple visits to ER and to our office for injections when pain is more severe.     ALLERGIES: Allergies  Allergen Reactions  . Nsaids Other (See Comments)    Chronic kidney failure  . Codeine   . Ramipril   . Imitrex [Sumatriptan] Nausea And Vomiting    HOME MEDICATIONS:  Current Outpatient Prescriptions:  .  ADVAIR DISKUS 250-50 MCG/DOSE AEPB, , Disp: , Rfl:  .  albuterol (PROVENTIL) (2.5 MG/3ML) 0.083% nebulizer solution, , Disp: , Rfl:  .  ALPRAZolam (XANAX) 1 MG tablet, Take 1 tablet (1 mg total) by mouth 3  (three) times daily as needed for anxiety., Disp: 90 tablet, Rfl: 0 .  Azelastine-Fluticasone 137-50 MCG/ACT SUSP, Place into the nose., Disp: , Rfl:  .  baclofen (LIORESAL) 10 MG tablet, Take 1 to 2 tablets by  mouth 3 times daily, Disp: 540 tablet, Rfl: 3 .  bupivacaine (MARCAINE PRESERVATIVE FREE) 0.5 % SOLN injection, Instill 15cc into bladder daily, Disp: , Rfl:  .  cetirizine (ZYRTEC) 10 MG tablet, Take 10 mg by mouth., Disp: , Rfl:  .  diltiazem (MATZIM LA) 180 MG 24 hr tablet, Take 180 mg by mouth daily., Disp: , Rfl:  .  DULoxetine (CYMBALTA) 60 MG capsule, Take 1 capsule (60 mg total) by mouth daily., Disp: 90 capsule, Rfl: 4 .  fluconazole (DIFLUCAN) 150 MG tablet, Take 150 mg by mouth., Disp: , Rfl:  .  furosemide (LASIX) 40 MG tablet, , Disp: , Rfl:  .  glucose blood test strip, , Disp: , Rfl:  .  heparin 93818 UNIT/ML injection, Instill 4 cc into bladder 2x daily, Disp: , Rfl:  .  hydroxychloroquine (PLAQUENIL) 200 MG tablet, Take 1 tablet (200 mg total) by mouth 2 (two) times daily., Disp: 30 tablet, Rfl: 0 .  hyoscyamine (LEVSIN SL) 0.125 MG SL tablet, Take 0.125 mg by mouth., Disp: , Rfl:  .  ipratropium-albuterol (DUONEB) 0.5-2.5 (3) MG/3ML SOLN, 3 mLs., Disp: , Rfl:  .  lactulose, encephalopathy, (CHRONULAC) 10 GM/15ML SOLN, , Disp: , Rfl:  .  Montelukast Sodium (SINGULAIR PO), Take by mouth., Disp: , Rfl:  .  oxyCODONE (ROXICODONE) 15 MG immediate release tablet, Take 1 tablet (15 mg total) by mouth every 8 (eight) hours as needed for pain., Disp: 90 tablet, Rfl: 0 .  pantoprazole (PROTONIX) 40 MG tablet, , Disp: , Rfl:  .  promethazine (PHENERGAN) 50 MG/ML injection, Frequency:   Dosage:0.0     Instructions:  Note:, Disp: , Rfl:  .  propranolol ER (INDERAL LA) 60 MG 24 hr capsule, , Disp: , Rfl:  .  sitaGLIPtin (JANUVIA) 100 MG tablet, Take 100 mg by mouth daily., Disp: , Rfl:  .  traZODone (DESYREL) 100 MG tablet, Take 1 tablet (100 mg total) by mouth at bedtime., Disp: 90  tablet, Rfl: 3 .  triamcinolone (NASACORT) 55 MCG/ACT AERO nasal inhaler, Place 2 sprays into the nose., Disp: , Rfl:  .  trimethoprim (TRIMPEX) 100 MG tablet, Take 1 tablet by mouth  daily, Disp: , Rfl:  .  Vitamin D, Ergocalciferol, (DRISDOL) 50000 UNITS CAPS capsule, Take 50,000 Units by mouth., Disp: , Rfl:  .  zolpidem (AMBIEN) 10 MG tablet, Frequency:   Dosage:0   MG  Instructions:  Note:Take 1 po nightly, Disp: , Rfl:  .  levETIRAcetam (KEPPRA) 750 MG tablet, Take 1 tablet (750 mg total) by mouth 2 (two) times daily., Disp: 180 tablet, Rfl: 3  PAST MEDICAL HISTORY: Past Medical History:  Diagnosis Date  . Asthma   . Carpal tunnel syndrome 09/29/2014  . Chronic back pain   . Chronic kidney disease   . Diabetes mellitus without complication (HCC)   . Fatty infiltration of liver 03/17/2015  . Fibromyalgia   . Hypertension   . IBS (irritable bowel syndrome)   . Interstitial cystitis   . Lupus   . Lupus (systemic lupus erythematosus) (HCC)   . Migraines   . Pseudotumor cerebri     PAST SURGICAL HISTORY: Past Surgical History:  Procedure Laterality Date  . ABDOMINAL HYSTERECTOMY    . APPENDECTOMY    . CHOLECYSTECTOMY    . CYSTOSCOPY      FAMILY HISTORY: Family History  Problem Relation Age of Onset  . Graves' disease Mother   . Heart disease Father   . Prostate cancer Father   . Healthy Brother   . Healthy Maternal Aunt   . Healthy Maternal Uncle     SOCIAL HISTORY:  Social History   Social History  . Marital status: Married    Spouse name: N/A  . Number of children: N/A  . Years of education: N/A   Occupational History  . Not on file.   Social History Main Topics  . Smoking status: Never Smoker  . Smokeless tobacco: Never Used  . Alcohol use No  . Drug use: No  . Sexual activity: Not on file   Other Topics Concern  . Not on file   Social History Narrative  . No narrative on file     PHYSICAL EXAM  Vitals:   08/22/16 0854  BP: 115/76    Pulse: 61  Resp: 18  Weight: 239 lb 8 oz (108.6 kg)  Height: 5\' 5"  (1.651 m)    Body mass index is 39.85 kg/m.   General: The patient is an obese woman in no distress.  Neck: The neck is supple.  She is tender over the splenius capitis muscles, right greater than left and also in the lower cervical paraspinal, upper thoracic paraspinal, rhomboid and trapezius muscles. Reduced range of motion in neck.   Neurologic Exam  Mental status: The patient is alert and oriented x 3 at the time of the examination. The patient has apparent normal recent and remote memory, with an apparently normal attention span and concentration ability.   Speech is normal.  Cranial nerves: Extraocular movements are full.     Facial symmetry is present.  Facial strength is normal.  Trapezius and sternocleidomastoid strength is normal. No dysarthria is noted.    Motor:  Muscle bulk is normal.   Tone is normal. Strength is  5 / 5 in limbs  Sensory: Sensory testing shows symmetric touch sensation proximally in limbs bur she reports reduced sensation in right hand  Gait and station: Station is normal.   Gait is normal.   Reflexes: Deep tendon reflexes are symmetric and normal bilaterally.     DIAGNOSTIC DATA (LABS, IMAGING, TESTING) - I reviewed patient records, labs, notes, testing and imaging myself where available.  Lab Results  Component Value Date   WBC 5.9 02/19/2015   HGB 12.7 02/19/2015   HCT 39.4 02/19/2015   MCV 89.5 02/19/2015   PLT 255 02/19/2015  ASSESSMENT AND PLAN  Chronic migraine w/o aura w/o status migrainosus, not intractable  Neck pain  Depression, unspecified depression type  Insomnia, unspecified type   1.     Trigger point injection of bilateral splenius capitis, trapezius,  lower C6C7 and T1T2 paraspinal muscles with 80 mg Depo-Medrol in [place] using sterile technique.  She tolerated the procedure well.  Pain was better afterwards. 2.    We will see if she  can get authorized for Botox therapy for her chronic migraines that are debilitating to her. Her migraines occur at least 24/ 30 days a month and at least 4 hours a day.    Botox had helped her in the past. 3.    Medications will be renewed.    4.    Return to see [place] in 4 months or call sooner if new or worsening neurologic symptoms.   Richard A. Epimenio Foot, MD, PhD 08/22/2016, 5:36 PM Certified in Neurology, Clinical Neurophysiology, Sleep Medicine, Pain Medicine and Neuroimaging  [facility] [address], [address] [place], [place] [address] [phone]

## 2016-08-22 NOTE — Patient Instructions (Signed)
For 3 days, take Topamax one half pill in the morning and one pill at night. For the next 3 days, only take Topamax 1 pill at night. For the next 3 days, only take Topamax one half pill at night and then you can stop.  For the Keppra, start at 1 pill at night for 1 week

## 2016-09-19 ENCOUNTER — Telehealth: Payer: Self-pay | Admitting: Neurology

## 2016-09-19 NOTE — Telephone Encounter (Signed)
I called the patient to schedule botox injection. She did not answer so I left a VM asking her to call me back.  °

## 2016-10-07 ENCOUNTER — Telehealth: Payer: Self-pay | Admitting: Neurology

## 2016-10-07 MED ORDER — ALPRAZOLAM 1 MG PO TABS: 1.0000 mg | ORAL_TABLET | Freq: Three times a day (TID) | ORAL | 0 refills | Status: DC | PRN

## 2016-10-07 MED ORDER — OXYCODONE HCL 15 MG PO TABS: 15.0000 mg | ORAL_TABLET | Freq: Three times a day (TID) | ORAL | 0 refills | Status: DC | PRN

## 2016-10-07 NOTE — Telephone Encounter (Signed)
Rx's awaiting RAS sig/fim 

## 2016-10-07 NOTE — Addendum Note (Signed)
Addended by: Candis Schatz I on: 10/07/2016 03:44 PM   Modules accepted: Orders

## 2016-10-07 NOTE — Telephone Encounter (Signed)
Pt calling for refill of ALPRAZolam (XANAX) 1 MG tablet  oxyCODONE (ROXICODONE) 15 MG immediate release tablet

## 2016-10-07 NOTE — Telephone Encounter (Signed)
Oxycodone and Xanax rx's up front [facility]/fim 

## 2016-10-16 DIAGNOSIS — R000 Tachycardia, unspecified: Secondary | ICD-10-CM | POA: Insufficient documentation

## 2016-11-06 ENCOUNTER — Telehealth: Payer: Self-pay | Admitting: Neurology

## 2016-11-06 MED ORDER — ALPRAZOLAM 1 MG PO TABS: 1.0000 mg | ORAL_TABLET | Freq: Three times a day (TID) | ORAL | 0 refills | Status: DC | PRN

## 2016-11-06 MED ORDER — OXYCODONE HCL 15 MG PO TABS: 15.0000 mg | ORAL_TABLET | Freq: Three times a day (TID) | ORAL | 0 refills | Status: DC | PRN

## 2016-11-06 NOTE — Telephone Encounter (Signed)
Rx's awaiting RAS sig/fim 

## 2016-11-06 NOTE — Telephone Encounter (Signed)
Patient called office requesting refill for ALPRAZolam (XANAX) 1 MG tablet and oxyCODONE (ROXICODONE) 15 MG immediate release tablet

## 2016-11-06 NOTE — Addendum Note (Signed)
Addended by: Candis Schatz I on: 11/06/2016 05:16 PM   Modules accepted: Orders

## 2016-11-07 NOTE — Telephone Encounter (Signed)
Rx's up front [facility]/fim 

## 2016-12-04 ENCOUNTER — Telehealth: Payer: Self-pay | Admitting: Neurology

## 2016-12-04 NOTE — Telephone Encounter (Signed)
Pt calling for refill of ALPRAZolam (XANAX) 1 MG tablet and oxyCODONE (ROXICODONE) 15 MG immediate release tablet

## 2016-12-05 ENCOUNTER — Other Ambulatory Visit: Payer: Self-pay | Admitting: *Deleted

## 2016-12-05 DIAGNOSIS — Z79899 Other long term (current) drug therapy: Secondary | ICD-10-CM

## 2016-12-05 DIAGNOSIS — G894 Chronic pain syndrome: Secondary | ICD-10-CM

## 2016-12-05 MED ORDER — OXYCODONE HCL 15 MG PO TABS: 15.0000 mg | ORAL_TABLET | Freq: Three times a day (TID) | ORAL | 0 refills | Status: DC | PRN

## 2016-12-05 MED ORDER — ALPRAZOLAM 1 MG PO TABS: 1.0000 mg | ORAL_TABLET | Freq: Three times a day (TID) | ORAL | 0 refills | Status: DC | PRN

## 2016-12-05 NOTE — Addendum Note (Signed)
Addended by: Candis Schatz I on: 12/05/2016 07:51 AM   Modules accepted: Orders

## 2016-12-05 NOTE — Telephone Encounter (Signed)
Rx's up front [facility]/fim 

## 2016-12-05 NOTE — Telephone Encounter (Signed)
Rx's awaiting RAS sig/fim 

## 2016-12-25 ENCOUNTER — Ambulatory Visit (INDEPENDENT_AMBULATORY_CARE_PROVIDER_SITE_OTHER): Payer: Medicare Other | Admitting: Neurology

## 2016-12-25 ENCOUNTER — Encounter: Payer: Self-pay | Admitting: Neurology

## 2016-12-25 VITALS — BP 124/83 | HR 57 | Wt 238.0 lb

## 2016-12-25 DIAGNOSIS — F329 Major depressive disorder, single episode, unspecified: Secondary | ICD-10-CM | POA: Diagnosis not present

## 2016-12-25 DIAGNOSIS — M329 Systemic lupus erythematosus, unspecified: Secondary | ICD-10-CM

## 2016-12-25 DIAGNOSIS — M797 Fibromyalgia: Secondary | ICD-10-CM

## 2016-12-25 DIAGNOSIS — G43709 Chronic migraine without aura, not intractable, without status migrainosus: Secondary | ICD-10-CM

## 2016-12-25 DIAGNOSIS — M542 Cervicalgia: Secondary | ICD-10-CM | POA: Diagnosis not present

## 2016-12-25 DIAGNOSIS — F32A Depression, unspecified: Secondary | ICD-10-CM

## 2016-12-25 MED ORDER — ALPRAZOLAM 1 MG PO TABS: 1.0000 mg | ORAL_TABLET | Freq: Three times a day (TID) | ORAL | 0 refills | Status: DC | PRN

## 2016-12-25 MED ORDER — OXYCODONE HCL 15 MG PO TABS: 15.0000 mg | ORAL_TABLET | Freq: Three times a day (TID) | ORAL | 0 refills | Status: DC | PRN

## 2016-12-25 NOTE — Progress Notes (Signed)
[facility]  PATIENT: Connie Arnold DOB: 10/28/1965     HISTORICAL  CHIEF COMPLAINT:  Chief Complaint  Patient presents with  . Follow-up    Migraine follow up    HISTORY OF PRESENT ILLNESS:  Connie Arnold is a 51 year old woman with chronic migraine headaches and other neurologic issues.   She is currently on steroids for rheumatology and feels neck pain and HA are better.     HA/neck pain:   She has had a severe headache for the past 5 days. Current pain is in the neck and head, right > left.     Pain is worse with movements and better with bedrest.    She gets nausea but not vomiting.   She notes photo and phonophobia.    She takes 15 mg po tid oxycodone.    It takes the edge off but does not eliminate the pain.   Topamax 200 mg poi bid, tizanidine and baclofen have only  helped a little bit.      Her headaches have done best on Botox in the past. Unfortunately, due to very high co-pays was unable to go back on in the past.  Chronic Migraine:  She is better the last 30 days with only 8 -10 migraines (usually 28-30/30).      Headaches last no more than 4 hours a day.    When present, pain is bilateral and throbbing.   There is often neck pain.    She has photophobia, phonophobia and nausea with headaches.  No vomiting.   Headaches are worse in the afternoons   Moving intensifies the pain.   The combination of Topamax and Keppra works better than Topamax alone.  Neck Pain:  Neck pain is better on steroids.    There is still some right > left pain.  In the past, occipital nerve blocks have helped the neck pain and often the headaches when they become worse.  Pain med's:    Currently, she is on oxycodone 15 mg by mouth 3 times a day and alprazolam 3 times a day. She is also on baclofen and tizanidine for spasticity.   She is on topiramate 200 mg po bid and Keppra for the headaches.  Mood:   Mood is currently doing well. She also thinks anxiety is doing a little bit  better. She is less apathetic. She is on duloxetine.  SLE:   She see Dr. Nydia Bouton at [facility].   She was diagnosed with SLE in 2006 and has been on Plaquenil x years and more recently Benlysta.    Her Complements were normal 11/17 but ANA is positive (speckled)    ------------ Chronic Migraine History:   She has had chronic migraine headache with moderate pain, flaring up to severe,  for most of the past couple of years   She is having chronic migraine 30/30 days every month, lasting for more than 4 hours every day.  She has received some benefit from occipital nerve blocks, trigger point injections and medications but the headaches still persist on a daily basis for past couple years.    She had been on Botox for migraine prophylaxis in the past with benefit. However, with changes in insurance she was unable to continue due to very high co-pay. She is currently on Topamax 200 mg by mouth twice a day and muscle relaxants and opiates.  She has occasional emergency room visits and frequent additional visits to my office for trigger point injections.Marland Kitchen  She has tried and failed multiple prophylactic medications for her chronic migraine: Antiepileptics:    zonisamide, topiramate, Keppra.   (Due to obesity, Depakote is a poor choice) Anti-depressants  amitriptyline, imipramine Anti-hypertensives:  Diamox Muscle relaxants:  baclofen, tizanidine NSAID's:  She is unable to tolerate chronic anti-inflammatories due to mild renal insufficiency.  She has had multiple visits to ER and to our office for injections when pain is more severe.     ALLERGIES: Allergies  Allergen Reactions  . Nsaids Other (See Comments)    Chronic kidney failure  . Codeine   . Ramipril   . Imitrex [Sumatriptan] Nausea And Vomiting    HOME MEDICATIONS:  Current Outpatient Prescriptions:  .  ADVAIR DISKUS 250-50 MCG/DOSE AEPB, , Disp: , Rfl:  .  albuterol (PROVENTIL HFA;VENTOLIN HFA) 108 (90 Base) MCG/ACT inhaler, Inhale into the  lungs., Disp: , Rfl:  .  albuterol (PROVENTIL) (2.5 MG/3ML) 0.083% nebulizer solution, , Disp: , Rfl:  .  ALPRAZolam (XANAX) 1 MG tablet, Take 1 tablet (1 mg total) by mouth 3 (three) times daily as needed for anxiety., Disp: 90 tablet, Rfl: 0 .  Azelastine-Fluticasone 137-50 MCG/ACT SUSP, Place into the nose., Disp: , Rfl:  .  baclofen (LIORESAL) 10 MG tablet, Take 1 to 2 tablets by  mouth 3 times daily, Disp: 540 tablet, Rfl: 3 .  bupivacaine (MARCAINE PRESERVATIVE FREE) 0.5 % SOLN injection, Instill 15cc into bladder daily, Disp: , Rfl:  .  cetirizine (ZYRTEC) 10 MG tablet, Take 10 mg by mouth., Disp: , Rfl:  .  diltiazem (MATZIM LA) 180 MG 24 hr tablet, Take 180 mg by mouth daily., Disp: , Rfl:  .  DULoxetine (CYMBALTA) 60 MG capsule, Take 1 capsule (60 mg total) by mouth daily., Disp: 90 capsule, Rfl: 4 .  fluconazole (DIFLUCAN) 150 MG tablet, Take 150 mg by mouth., Disp: , Rfl:  .  furosemide (LASIX) 40 MG tablet, , Disp: , Rfl:  .  glucose blood test strip, , Disp: , Rfl:  .  heparin 82993 UNIT/ML injection, Instill 4 cc into bladder 2x daily, Disp: , Rfl:  .  hydroxychloroquine (PLAQUENIL) 200 MG tablet, Take 1 tablet (200 mg total) by mouth 2 (two) times daily., Disp: 30 tablet, Rfl: 0 .  hyoscyamine (LEVSIN SL) 0.125 MG SL tablet, Take 0.125 mg by mouth., Disp: , Rfl:  .  ipratropium-albuterol (DUONEB) 0.5-2.5 (3) MG/3ML SOLN, 3 mLs., Disp: , Rfl:  .  lactulose, encephalopathy, (CHRONULAC) 10 GM/15ML SOLN, , Disp: , Rfl:  .  levETIRAcetam (KEPPRA) 750 MG tablet, Take by mouth., Disp: , Rfl:  .  montelukast (SINGULAIR) 10 MG tablet, , Disp: , Rfl:  .  oxyCODONE (ROXICODONE) 15 MG immediate release tablet, Take 1 tablet (15 mg total) by mouth every 8 (eight) hours as needed for pain., Disp: 90 tablet, Rfl: 0 .  promethazine (PHENERGAN) 50 MG/ML injection, Frequency:   Dosage:0.0     Instructions:  Note:, Disp: , Rfl:  .  propranolol ER (INDERAL LA) 60 MG 24 hr capsule, , Disp: , Rfl:  .   sitaGLIPtin (JANUVIA) 100 MG tablet, Take 100 mg by mouth daily., Disp: , Rfl:  .  topiramate (TOPAMAX) 200 MG tablet, , Disp: , Rfl:  .  traZODone (DESYREL) 100 MG tablet, Take 1 tablet (100 mg total) by mouth at bedtime., Disp: 90 tablet, Rfl: 3 .  triamcinolone (NASACORT) 55 MCG/ACT AERO nasal inhaler, Place 2 sprays into the nose., Disp: , Rfl:  .  trimethoprim (  TRIMPEX) 100 MG tablet, Take 1 tablet by mouth  daily, Disp: , Rfl:  .  Vitamin D, Ergocalciferol, (DRISDOL) 50000 UNITS CAPS capsule, Take 50,000 Units by mouth., Disp: , Rfl:  .  zolpidem (AMBIEN) 10 MG tablet, Frequency:   Dosage:0   MG  Instructions:  Note:Take 1 po nightly, Disp: , Rfl:  .  FLUoxetine (PROZAC) 40 MG capsule, , Disp: , Rfl:  .  predniSONE (DELTASONE) 5 MG tablet, , Disp: , Rfl:   PAST MEDICAL HISTORY: Past Medical History:  Diagnosis Date  . Asthma   . Carpal tunnel syndrome 09/29/2014  . Chronic back pain   . Chronic kidney disease   . Diabetes mellitus without complication (HCC)   . Fatty infiltration of liver 03/17/2015  . Fibromyalgia   . Hypertension   . IBS (irritable bowel syndrome)   . Interstitial cystitis   . Lupus   . Lupus (systemic lupus erythematosus) (HCC)   . Migraines   . Pseudotumor cerebri     PAST SURGICAL HISTORY: Past Surgical History:  Procedure Laterality Date  . ABDOMINAL HYSTERECTOMY    . APPENDECTOMY    . CHOLECYSTECTOMY    . CYSTOSCOPY      FAMILY HISTORY: Family History  Problem Relation Age of Onset  . Graves' disease Mother   . Heart disease Father   . Prostate cancer Father   . Healthy Brother   . Healthy Maternal Aunt   . Healthy Maternal Uncle     SOCIAL HISTORY:  Social History   Social History  . Marital status: Married    Spouse name: N/A  . Number of children: N/A  . Years of education: N/A   Occupational History  . Not on file.   Social History Main Topics  . Smoking status: Never Smoker  . Smokeless tobacco: Never Used  . Alcohol  use No  . Drug use: No  . Sexual activity: Not on file   Other Topics Concern  . Not on file   Social History Narrative  . No narrative on file     PHYSICAL EXAM  Vitals:   12/25/16 0835  BP: 124/83  Pulse: (!) 57  Weight: 238 lb (108 kg)    Body mass index is 39.61 kg/m.   General: The patient is an obese woman in no distress.  Neck: The neck is supple.  She has just mild tenderness over the splenius capitis muscles and not much tenderness in the cervical paraspinal muscles. Range of motion is a little better in the neck than usual..   Neurologic Exam  Mental status: The patient is alert and oriented x 3 at the time of the examination. The patient has apparent normal recent and remote memory, with an apparently normal attention span and concentration ability.   Speech is normal.  Cranial nerves: Extraocular movements are full.     Facial strength and sensation is normal. Palatal elevation tongue protrusion is midline. Trapezius strength is normal.   Motor:  Muscle bulk is normal.   Tone is normal. Strength is  5 / 5 in limbs  Sensory: She has intact sensation to touch and vibration in the arms and legs.   Gait and station: Station is normal.   Gait is normal.   Reflexes: Deep tendon reflexes are symmetric and normal bilaterally.     DIAGNOSTIC DATA (LABS, IMAGING, TESTING) - I reviewed patient records, labs, notes, testing and imaging myself where available.  Lab Results  Component Value Date  WBC 5.9 02/19/2015   HGB 12.7 02/19/2015   HCT 39.4 02/19/2015   MCV 89.5 02/19/2015   PLT 255 02/19/2015        ASSESSMENT AND PLAN  Chronic migraine w/o aura w/o status migrainosus, not intractable  Depression, unspecified depression type  Neck pain  Fibromyalgia  Systemic lupus erythematosus, unspecified SLE type, unspecified organ involvement status (HCC)   1.     Continue Topamax and Keppra for the migraines. She will also continue oxycodone 15  mg by mouth 3 times a day. The [place] controlled substance database was reviewed and she is compliant. She does not show drug-seeking behavior. Refills were provided for the controlled substances.  2.    She continues to have chronic migraine although this month is better the last visit. We have discussed Botox and Aimovig. She is in process of changing insurances and this will be reevaluated.  3.    20 stay active and exercises as tolerated. 4.  Return to see [place] in 4 months or call sooner if new or worsening neurologic symptoms.   Railee Bonillas A. Epimenio Foot, MD, PhD 12/25/2016, 8:58 AM Certified in Neurology, Clinical Neurophysiology, Sleep Medicine, Pain Medicine and Neuroimaging  [facility] [address] [place], [place] [address] [phone]

## 2016-12-30 DIAGNOSIS — E782 Mixed hyperlipidemia: Secondary | ICD-10-CM | POA: Insufficient documentation

## 2017-02-05 ENCOUNTER — Other Ambulatory Visit: Payer: Self-pay | Admitting: *Deleted

## 2017-02-05 ENCOUNTER — Telehealth: Payer: Self-pay | Admitting: Neurology

## 2017-02-05 DIAGNOSIS — G894 Chronic pain syndrome: Secondary | ICD-10-CM

## 2017-02-05 DIAGNOSIS — Z79899 Other long term (current) drug therapy: Secondary | ICD-10-CM

## 2017-02-05 MED ORDER — ALPRAZOLAM 1 MG PO TABS: 1.0000 mg | ORAL_TABLET | Freq: Three times a day (TID) | ORAL | 0 refills | Status: DC | PRN

## 2017-02-05 MED ORDER — OXYCODONE HCL 15 MG PO TABS: 15.0000 mg | ORAL_TABLET | Freq: Three times a day (TID) | ORAL | 0 refills | Status: DC | PRN

## 2017-02-05 NOTE — Telephone Encounter (Signed)
Rx's up front [facility]/fim 

## 2017-02-05 NOTE — Addendum Note (Signed)
Addended by: Candis Schatz I on: 02/05/2017 12:13 PM   Modules accepted: Orders

## 2017-02-05 NOTE — Telephone Encounter (Signed)
Pt calling for a refill of  oxyCODONE (ROXICODONE) 15 MG immediate release tablet  ALPRAZolam (XANAX) 1 MG tablet

## 2017-02-05 NOTE — Telephone Encounter (Signed)
Rx's awaiting RAS sig/fim 

## 2017-02-07 ENCOUNTER — Other Ambulatory Visit (INDEPENDENT_AMBULATORY_CARE_PROVIDER_SITE_OTHER): Payer: Self-pay

## 2017-02-07 DIAGNOSIS — Z0289 Encounter for other administrative examinations: Secondary | ICD-10-CM

## 2017-02-07 DIAGNOSIS — Z79899 Other long term (current) drug therapy: Secondary | ICD-10-CM

## 2017-02-07 DIAGNOSIS — G894 Chronic pain syndrome: Secondary | ICD-10-CM

## 2017-02-08 LAB — MED LIST OPTION NOT SELECTED

## 2017-02-15 LAB — DRUG SCREEN, UR (12+OXYCODONE+CRT)
AMPHETAMINE SCREEN URINE: NEGATIVE ng/mL
BARBITURATE SCREEN URINE: NEGATIVE ng/mL
BENZODIAZEPINE SCREEN, URINE: POSITIVE ng/mL — AB
CANNABINOIDS UR QL SCN: NEGATIVE ng/mL
COCAINE(METAB.)SCREEN, URINE: NEGATIVE ng/mL
Creatinine(Crt), U: 186.1 mg/dL (ref 20.0–300.0)
FENTANYL, URINE: NEGATIVE pg/mL
MEPERIDINE SCREEN, URINE: NEGATIVE ng/mL
Methadone Screen, Urine: NEGATIVE ng/mL
OPIATE SCREEN URINE: NEGATIVE ng/mL
OXYCODONE+OXYMORPHONE UR QL SCN: POSITIVE — AB
Ph of Urine: 5.4 (ref 4.5–8.9)
Phencyclidine Qn, Ur: NEGATIVE ng/mL
Propoxyphene Scrn, Ur: NEGATIVE ng/mL
SPECIFIC GRAVITY: 1.031
Tramadol Screen, Urine: NEGATIVE ng/mL

## 2017-03-06 ENCOUNTER — Telehealth: Payer: Self-pay | Admitting: Neurology

## 2017-03-06 MED ORDER — OXYCODONE HCL 15 MG PO TABS: 15.0000 mg | ORAL_TABLET | Freq: Three times a day (TID) | ORAL | 0 refills | Status: DC | PRN

## 2017-03-06 MED ORDER — ALPRAZOLAM 1 MG PO TABS: 1.0000 mg | ORAL_TABLET | Freq: Three times a day (TID) | ORAL | 0 refills | Status: DC | PRN

## 2017-03-06 NOTE — Telephone Encounter (Signed)
Pt request refill for ALPRAZolam (XANAX) 1 MG tablet and oxyCODONE (ROXICODONE) 15 MG immediate release tablet . Pt is aware clinic closes at noon tomorrow

## 2017-03-06 NOTE — Telephone Encounter (Signed)
Rx's awaiting RAS sig/fim 

## 2017-03-06 NOTE — Telephone Encounter (Signed)
Rx's up front [facility]/fim 

## 2017-04-03 ENCOUNTER — Telehealth: Payer: Self-pay | Admitting: Neurology

## 2017-04-03 MED ORDER — OXYCODONE HCL 15 MG PO TABS: 15.0000 mg | ORAL_TABLET | Freq: Three times a day (TID) | ORAL | 0 refills | Status: DC | PRN

## 2017-04-03 MED ORDER — ALPRAZOLAM 1 MG PO TABS: 1.0000 mg | ORAL_TABLET | Freq: Three times a day (TID) | ORAL | 0 refills | Status: DC | PRN

## 2017-04-03 NOTE — Telephone Encounter (Signed)
Pt request refill for ALPRAZolam (XANAX) 1 MG tablet and oxyCODONE (ROXICODONE) 15 MG immediate release tablet . Pt is aware the clinic closes at noon tomorrow

## 2017-04-03 NOTE — Telephone Encounter (Signed)
Rx's up front [facility]/fim 

## 2017-04-03 NOTE — Telephone Encounter (Signed)
Rx's awaiting RAS sig/fim 

## 2017-04-09 ENCOUNTER — Telehealth: Payer: Self-pay | Admitting: *Deleted

## 2017-04-09 NOTE — Telephone Encounter (Signed)
LMTC.  I need to r/s her 05/01/17 appt. with Dr. Epimenio Foot.  He will not be in the office that day./fim

## 2017-05-01 ENCOUNTER — Ambulatory Visit: Payer: Self-pay | Admitting: Neurology

## 2017-05-05 ENCOUNTER — Telehealth: Payer: Self-pay | Admitting: Neurology

## 2017-05-05 MED ORDER — OXYCODONE HCL 15 MG PO TABS: 15.0000 mg | ORAL_TABLET | Freq: Three times a day (TID) | ORAL | 0 refills | Status: DC | PRN

## 2017-05-05 MED ORDER — ALPRAZOLAM 1 MG PO TABS: 1.0000 mg | ORAL_TABLET | Freq: Three times a day (TID) | ORAL | 0 refills | Status: DC | PRN

## 2017-05-05 NOTE — Telephone Encounter (Signed)
Rx. up front [facility]/fim 

## 2017-05-05 NOTE — Telephone Encounter (Signed)
Pt request refill for ALPRAZolam (XANAX) 1 MG tablet and oxyCODONE (ROXICODONE) 15 MG immediate release tablet

## 2017-05-05 NOTE — Telephone Encounter (Signed)
Rx's awaiting RAS sig/fim 

## 2017-05-19 ENCOUNTER — Other Ambulatory Visit: Payer: Self-pay | Admitting: Neurology

## 2017-06-03 ENCOUNTER — Telehealth: Payer: Self-pay | Admitting: Neurology

## 2017-06-03 NOTE — Telephone Encounter (Signed)
Patient requesting refill of  ALPRAZolam (XANAX) 1 MG tablet and oxyCODONE (ROXICODONE) 15 MG immediate release tablet.

## 2017-06-04 MED ORDER — ALPRAZOLAM 1 MG PO TABS: 1.0000 mg | ORAL_TABLET | Freq: Three times a day (TID) | ORAL | 0 refills | Status: DC | PRN

## 2017-06-04 MED ORDER — OXYCODONE HCL 15 MG PO TABS: 15.0000 mg | ORAL_TABLET | Freq: Three times a day (TID) | ORAL | 0 refills | Status: DC | PRN

## 2017-06-04 NOTE — Telephone Encounter (Signed)
Rx's awaiting RAS sig/fim 

## 2017-06-04 NOTE — Telephone Encounter (Signed)
Rx. up front [facility]/fim 

## 2017-06-04 NOTE — Addendum Note (Signed)
Addended by: Candis Schatz I on: 06/04/2017 09:23 AM   Modules accepted: Orders

## 2017-06-27 ENCOUNTER — Encounter: Payer: Self-pay | Admitting: Neurology

## 2017-06-27 ENCOUNTER — Ambulatory Visit: Payer: Medicare Other | Admitting: Neurology

## 2017-06-27 ENCOUNTER — Other Ambulatory Visit: Payer: Self-pay

## 2017-06-27 VITALS — BP 116/78 | HR 74 | Resp 18 | Ht 65.0 in | Wt 252.0 lb

## 2017-06-27 DIAGNOSIS — G4700 Insomnia, unspecified: Secondary | ICD-10-CM | POA: Diagnosis not present

## 2017-06-27 DIAGNOSIS — M329 Systemic lupus erythematosus, unspecified: Secondary | ICD-10-CM | POA: Diagnosis not present

## 2017-06-27 DIAGNOSIS — M542 Cervicalgia: Secondary | ICD-10-CM

## 2017-06-27 DIAGNOSIS — G43709 Chronic migraine without aura, not intractable, without status migrainosus: Secondary | ICD-10-CM | POA: Diagnosis not present

## 2017-06-27 MED ORDER — BUSPIRONE HCL 30 MG PO TABS: 30.0000 mg | ORAL_TABLET | Freq: Two times a day (BID) | ORAL | 5 refills | Status: DC

## 2017-06-27 MED ORDER — OXYCODONE HCL 15 MG PO TABS: 15.0000 mg | ORAL_TABLET | Freq: Three times a day (TID) | ORAL | 0 refills | Status: DC | PRN

## 2017-06-27 MED ORDER — FREMANEZUMAB-VFRM 225 MG/1.5ML ~~LOC~~ SOSY: 1.0000 | PREFILLED_SYRINGE | SUBCUTANEOUS | 4 refills | Status: DC

## 2017-06-27 NOTE — Progress Notes (Addendum)
[facility]  PATIENT: Connie Arnold DOB: [birth year]     HISTORICAL  CHIEF COMPLAINT:  Chief Complaint  Patient presents with  . Migraines    Sts. she is having more neck/upper back pain/headaches. Would like tpi's today if appropriate/fim    HISTORY OF PRESENT ILLNESS:  Connie Arnold is a 52 year old woman with chronic migraine headaches and other neurologic issues.     Update 06/27/17: She feels her headaches are doing about the same.   She has more pain in the neck and upper back.   Neck pain radiates into the back of the head.   In the past, TPI's or nerve blocks have helped her pain.    She switched from Topamax to Keppra but feels the Topamax helped more.    She is also on muscle relaxants.   HA's are back to daily (30/30 days > 4 hours a day).    She was diagnosed with SLE and is on steroids and Plaquenil and Benlysta.   She has generalized joint pain.   Her right foot/ankle are especially painful.  The [place] controlled database was reviewed and she is compliant and doe not get controlled substances from other doctors.     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    This improved and she no longer has papilledema.       Update 12/26/2016: HA/neck pain:   She has had a severe headache for the past 5 days. Current pain is in the neck and head, right > left.     Pain is worse with movements and better with bedrest.    She gets nausea but not vomiting.   She notes photo and phonophobia.    She takes 15 mg po tid oxycodone.    It takes the edge off but does not eliminate the pain.   Topamax 200 mg poi bid, tizanidine and baclofen have only  helped a little bit.      Her headaches have done best on Botox in the past. Unfortunately, due to very high co-pays was unable to go back on in the past.  Chronic Migraine:  She is better the last 30 days with only 8 -10 migraines (usually 28-30/30).      Headaches last no more than 4 hours a day.     When present, pain is bilateral and throbbing.   There is often neck pain.    She has photophobia, phonophobia and nausea with headaches.  No vomiting.   Headaches are worse in the afternoons   Moving intensifies the pain.   The combination of Topamax and Keppra works better than Topamax alone.  Neck Pain:  Neck pain is better on steroids.    There is still some right > left pain.  In the past, occipital nerve blocks have helped the neck pain and often the headaches when they become worse.  Pain med's:    Currently, she is on oxycodone 15 mg by mouth 3 times a day and alprazolam 3 times a day. She is also on baclofen and tizanidine for spasticity.   She is on topiramate 200 mg po bid and Keppra for the headaches.  Mood:   Mood is currently doing well. She also thinks anxiety is doing a little bit better. She is less apathetic. She is on duloxetine.  SLE:   She see Dr. Nydia Bouton at [facility].   She was diagnosed with SLE in 2006 and has  been on Plaquenil x years and more recently Benlysta.    Her Complements were normal 11/17 but ANA is positive (speckled)    ------------ Chronic Migraine History:   She has had chronic migraine headache with moderate pain, flaring up to severe,  for most of the past couple of years   She is having chronic migraine 30/30 days every month, lasting for more than 4 hours every day.  She has received some benefit from occipital nerve blocks, trigger point injections and medications but the headaches still persist on a daily basis for past couple years.    She had been on Botox for migraine prophylaxis in the past with benefit. However, with changes in insurance she was unable to continue due to very high co-pay. She is currently on Topamax 200 mg by mouth twice a day and muscle relaxants and opiates.  She has occasional emergency room visits and frequent additional visits to my office for trigger point injections..  She has tried and failed multiple prophylactic medications for her  chronic migraine: Antiepileptics:    zonisamide, topiramate, Keppra.   (Due to obesity, Depakote is a poor choice) Anti-depressants  amitriptyline, imipramine Anti-hypertensives:  Diamox Muscle relaxants:  baclofen, tizanidine NSAID's:  She is unable to tolerate chronic anti-inflammatories due to mild renal insufficiency.  She has had multiple visits to ER and to our office for injections when pain is more severe.     ALLERGIES: Allergies  Allergen Reactions  . Nsaids Other (See Comments)    Chronic kidney failure  . Codeine   . Ramipril   . Imitrex [Sumatriptan] Nausea And Vomiting    HOME MEDICATIONS:  Current Outpatient Medications:  .  ADVAIR DISKUS 250-50 MCG/DOSE AEPB, , Disp: , Rfl:  .  albuterol (PROVENTIL HFA;VENTOLIN HFA) 108 (90 Base) MCG/ACT inhaler, Inhale into the lungs., Disp: , Rfl:  .  albuterol (PROVENTIL) (2.5 MG/3ML) 0.083% nebulizer solution, , Disp: , Rfl:  .  ALPRAZolam (XANAX) 1 MG tablet, Take 1 tablet (1 mg total) by mouth 3 (three) times daily as needed for anxiety., Disp: 90 tablet, Rfl: 0 .  Azelastine-Fluticasone 137-50 MCG/ACT SUSP, Place into the nose., Disp: , Rfl:  .  baclofen (LIORESAL) 10 MG tablet, Take 1 to 2 tablets by  mouth 3 times daily, Disp: 540 tablet, Rfl: 3 .  bupivacaine (MARCAINE PRESERVATIVE FREE) 0.5 % SOLN injection, Instill 15cc into bladder daily, Disp: , Rfl:  .  cetirizine (ZYRTEC) 10 MG tablet, Take 10 mg by mouth., Disp: , Rfl:  .  DULoxetine (CYMBALTA) 60 MG capsule, Take 1 capsule (60 mg total) by mouth daily., Disp: 90 capsule, Rfl: 4 .  fluconazole (DIFLUCAN) 150 MG tablet, Take 150 mg by mouth., Disp: , Rfl:  .  FLUoxetine (PROZAC) 40 MG capsule, , Disp: , Rfl:  .  furosemide (LASIX) 40 MG tablet, , Disp: , Rfl:  .  glucose blood test strip, , Disp: , Rfl:  .  heparin 81191 UNIT/ML injection, Instill 4 cc into bladder 2x daily, Disp: , Rfl:  .  hydroxychloroquine (PLAQUENIL) 200 MG tablet, Take 1 tablet (200 mg total)  by mouth 2 (two) times daily., Disp: 30 tablet, Rfl: 0 .  hyoscyamine (LEVSIN SL) 0.125 MG SL tablet, Take 0.125 mg by mouth., Disp: , Rfl:  .  ipratropium-albuterol (DUONEB) 0.5-2.5 (3) MG/3ML SOLN, 3 mLs., Disp: , Rfl:  .  lactulose, encephalopathy, (CHRONULAC) 10 GM/15ML SOLN, , Disp: , Rfl:  .  levETIRAcetam (KEPPRA) 750 MG tablet, Take  by mouth., Disp: , Rfl:  .  montelukast (SINGULAIR) 10 MG tablet, , Disp: , Rfl:  .  oxyCODONE (ROXICODONE) 15 MG immediate release tablet, Take 1 tablet (15 mg total) by mouth every 8 (eight) hours as needed for pain., Disp: 90 tablet, Rfl: 0 .  promethazine (PHENERGAN) 50 MG/ML injection, Frequency:   Dosage:0.0     Instructions:  Note:, Disp: , Rfl:  .  sitaGLIPtin (JANUVIA) 100 MG tablet, Take 100 mg by mouth daily., Disp: , Rfl:  .  traZODone (DESYREL) 100 MG tablet, Take 1 tablet (100 mg total) by mouth at bedtime., Disp: 90 tablet, Rfl: 3 .  triamcinolone (NASACORT) 55 MCG/ACT AERO nasal inhaler, Place 2 sprays into the nose., Disp: , Rfl:  .  trimethoprim (TRIMPEX) 100 MG tablet, Take 1 tablet by mouth  daily, Disp: , Rfl:  .  Vitamin D, Ergocalciferol, (DRISDOL) 50000 UNITS CAPS capsule, Take 50,000 Units by mouth., Disp: , Rfl:  .  busPIRone (BUSPAR) 30 MG tablet, Take 1 tablet (30 mg total) by mouth 2 (two) times daily., Disp: 60 tablet, Rfl: 5 .  diltiazem (MATZIM LA) 180 MG 24 hr tablet, Take 180 mg by mouth daily., Disp: , Rfl:  .  Fremanezumab-vfrm (AJOVY) 225 MG/1.5ML SOSY, Inject 1 Syringe into the skin every 28 (twenty-eight) days., Disp: 3 Syringe, Rfl: 4 .  predniSONE (DELTASONE) 5 MG tablet, , Disp: , Rfl:  .  propranolol ER (INDERAL LA) 60 MG 24 hr capsule, , Disp: , Rfl:  .  topiramate (TOPAMAX) 200 MG tablet, , Disp: , Rfl:  .  zolpidem (AMBIEN) 10 MG tablet, Frequency:   Dosage:0   MG  Instructions:  Note:Take 1 po nightly, Disp: , Rfl:   PAST MEDICAL HISTORY: Past Medical History:  Diagnosis Date  . Asthma   . Carpal tunnel  syndrome 09/29/2014  . Chronic back pain   . Chronic kidney disease   . Diabetes mellitus without complication (HCC)   . Fatty infiltration of liver 03/17/2015  . Fibromyalgia   . Hypertension   . IBS (irritable bowel syndrome)   . Interstitial cystitis   . Lupus   . Lupus (systemic lupus erythematosus) (HCC)   . Migraines   . Pseudotumor cerebri     PAST SURGICAL HISTORY: Past Surgical History:  Procedure Laterality Date  . ABDOMINAL HYSTERECTOMY    . APPENDECTOMY    . CHOLECYSTECTOMY    . CYSTOSCOPY      FAMILY HISTORY: Family History  Problem Relation Age of Onset  . Graves' disease Mother   . Heart disease Father   . Prostate cancer Father   . Healthy Brother   . Healthy Maternal Aunt   . Healthy Maternal Uncle     SOCIAL HISTORY:  Social History   Socioeconomic History  . Marital status: Married    Spouse name: Not on file  . Number of children: Not on file  . Years of education: Not on file  . Highest education level: Not on file  Social Needs  . Financial resource strain: Not on file  . Food insecurity - worry: Not on file  . Food insecurity - inability: Not on file  . Transportation needs - medical: Not on file  . Transportation needs - non-medical: Not on file  Occupational History  . Not on file  Tobacco Use  . Smoking status: Never Smoker  . Smokeless tobacco: Never Used  Substance and Sexual Activity  . Alcohol use: No  . Drug use:  No  . Sexual activity: Not on file  Other Topics Concern  . Not on file  Social History Narrative  . Not on file     PHYSICAL EXAM  Vitals:   06/27/17 0844  BP: 116/78  Pulse: 74  Resp: 18  Weight: 252 lb (114.3 kg)  Height: 5\' 5"  (1.651 m)    Body mass index is 41.93 kg/m.   General: The patient is an obese woman in no distress.  Neck: The neck is supple.  She has just mild tenderness over the splenius capitis muscles and not much tenderness in the cervical paraspinal muscles. Range of motion  is a little better in the neck than usual..   Neurologic Exam  Mental status: The patient is alert and oriented x 3 at the time of the examination. The patient has apparent normal recent and remote memory, with an apparently normal attention span and concentration ability.   Speech is normal.  Cranial nerves: Extraocular movements are full.     Facial strength and sensation is normal. Palatal elevation tongue protrusion is midline. Trapezius strength is normal.   Motor:  Muscle bulk is normal.   Tone is normal. Strength is  5 / 5 in limbs  Sensory: She has intact sensation to touch and vibration in the arms and legs.   Gait and station: Station is normal.   Gait is normal.   Reflexes: Deep tendon reflexes are symmetric and normal bilaterally.     DIAGNOSTIC DATA (LABS, IMAGING, TESTING) - I reviewed patient records, labs, notes, testing and imaging myself where available.  Lab Results  Component Value Date   WBC 5.9 02/19/2015   HGB 12.7 02/19/2015   HCT 39.4 02/19/2015   MCV 89.5 02/19/2015   PLT 255 02/19/2015        ASSESSMENT AND PLAN  Chronic migraine w/o aura w/o status migrainosus, not intractable  Neck pain  Insomnia, unspecified type  Systemic lupus erythematosus, unspecified SLE type, unspecified organ involvement status (HCC)   1.     Change Keppra back to Topamax for the headaches.     Start Ajovy for the chronic migraine 2.     We discussed that she is on both an opiate and a benzodiazepine and the combination has more risk than either by itself. Would like her to be on only one of these categories. We discussed the benefit that she gets from the different medications. She feels that the opiate is giving her more benefit for the pain then the Xanax is getting for her other symptoms. Therefore, I want her to taper off of the Xanax over the next 2-3 weeks I will refill the opiate. I advised her against getting a benzodiazepine from another provider as I will  not be able to continue her opioid prescription if she does so.   She has been compliant with medications per the [facility] controlled substance database and does not get short substances from other providers at this time. 3.  Bilateral splenius capitis trigger point injection with 80 mg Depo-Medrol in [place]. She tolerated the procedure well with no complications. 4.  Stay active and exercises as tolerated. 5.  Return to see [place] in 4 months or call sooner if new or worsening neurologic symptoms.   Edelyn Heidel A. Epimenio Foot, MD, PhD 06/27/2017, 9:49 AM Certified in Neurology, Clinical Neurophysiology, Sleep Medicine, Pain Medicine and Neuroimaging  [facility] [address], [address] [place], [place] [address] [phone]

## 2017-08-04 ENCOUNTER — Telehealth: Payer: Self-pay | Admitting: Neurology

## 2017-08-04 MED ORDER — OXYCODONE HCL 15 MG PO TABS: 15.0000 mg | ORAL_TABLET | Freq: Three times a day (TID) | ORAL | 0 refills | Status: DC | PRN

## 2017-08-04 NOTE — Telephone Encounter (Signed)
Last fill date was for 07/07/17 for #90. Rx printed, awaiting sig.

## 2017-08-04 NOTE — Telephone Encounter (Signed)
Rx signed and at the front desk. Pt is aware.

## 2017-08-04 NOTE — Telephone Encounter (Signed)
Pt request refill for oxyCODONE (ROXICODONE) 15 MG immediate release tablet °

## 2017-09-02 ENCOUNTER — Telehealth: Payer: Self-pay | Admitting: Neurology

## 2017-09-02 MED ORDER — OXYCODONE HCL 15 MG PO TABS: 15.0000 mg | ORAL_TABLET | Freq: Three times a day (TID) | ORAL | 0 refills | Status: DC | PRN

## 2017-09-02 NOTE — Telephone Encounter (Signed)
Rx. up front [facility]/fim 

## 2017-09-02 NOTE — Telephone Encounter (Signed)
Rx. awaiting RAS sig/fim 

## 2017-09-02 NOTE — Telephone Encounter (Signed)
Pt request refill for oxyCODONE (ROXICODONE) 15 MG immediate release tablet °

## 2017-10-02 ENCOUNTER — Telehealth: Payer: Self-pay | Admitting: Neurology

## 2017-10-02 NOTE — Telephone Encounter (Signed)
Pt request refill for oxyCODONE (ROXICODONE) 15 MG immediate release tablet. Pt is aware the clinic closes at noon tomorrow

## 2017-10-03 MED ORDER — OXYCODONE HCL 15 MG PO TABS: 15.0000 mg | ORAL_TABLET | Freq: Three times a day (TID) | ORAL | 0 refills | Status: DC | PRN

## 2017-10-03 NOTE — Telephone Encounter (Signed)
Printed prescription for Dr. Epimenio Foot to sign (# 90) last prescription 09-02-17.

## 2017-10-27 ENCOUNTER — Other Ambulatory Visit: Payer: Self-pay

## 2017-10-27 ENCOUNTER — Ambulatory Visit: Payer: Medicare Other | Admitting: Neurology

## 2017-10-27 DIAGNOSIS — G932 Benign intracranial hypertension: Secondary | ICD-10-CM

## 2017-10-27 DIAGNOSIS — G43709 Chronic migraine without aura, not intractable, without status migrainosus: Secondary | ICD-10-CM

## 2017-10-27 DIAGNOSIS — F1190 Opioid use, unspecified, uncomplicated: Secondary | ICD-10-CM

## 2017-10-27 DIAGNOSIS — M797 Fibromyalgia: Secondary | ICD-10-CM | POA: Diagnosis not present

## 2017-10-27 DIAGNOSIS — G4700 Insomnia, unspecified: Secondary | ICD-10-CM | POA: Diagnosis not present

## 2017-10-27 MED ORDER — TOPIRAMATE 200 MG PO TABS: 200.0000 mg | ORAL_TABLET | Freq: Two times a day (BID) | ORAL | 4 refills | Status: DC

## 2017-10-27 MED ORDER — OXYCODONE HCL 15 MG PO TABS: 15.0000 mg | ORAL_TABLET | Freq: Three times a day (TID) | ORAL | 0 refills | Status: DC | PRN

## 2017-10-27 MED ORDER — ERENUMAB-AOOE 70 MG/ML ~~LOC~~ SOAJ: 1.0000 | SUBCUTANEOUS | 4 refills | Status: DC

## 2017-10-27 NOTE — Progress Notes (Signed)
[facility]  PATIENT: Connie Arnold DOB: 11/14/1965     HISTORICAL  CHIEF COMPLAINT:  Chief Complaint  Patient presents with  . Migraines    Sts. h/a's are same frequency but "more noticeable during the day." Never started Ajovy--ins. denied it.  Needs r/f of Topamax./fim    HISTORY OF PRESENT ILLNESS:  Connie Arnold is a 52 year old woman with chronic migraine headaches and other neurologic issues.    Update 10/27/2017: She continues to have migraine headaches on a daily basis.   These occur across her scalp.     HA's improve with sleep and get worse if she is sleep deprived.  Pain is throbbing.   She gets nausea but no vomiting.    She has photo/phonophobia    Also, she gets benefit from the splenius capitus TPI/ONBs performed every 4 months or so.,     In the past, Botox had helped.   Her insurance does not cover it well.   They also did not cover Ajovy.      FMS pain is about the same.    Oxycodone helps pain some and is well tolerated.  [place] database reviewed and she is compliant and not getting controlled substances elsewhere.   No drug seeking behavior.    She still has trouble falling asleep.   At the last visit, we simplified the controlled substances by elinminating hte benzodiazepine and continuing oxycodone.     Update 06/27/17: She feels her headaches are doing about the same.   She has more pain in the neck and upper back.   Neck pain radiates into the back of the head.   In the past, TPI's or nerve blocks have helped her pain.    She switched from Topamax to Keppra but feels the Topamax helped more.    She is also on muscle relaxants.   HA's are back to daily (30/30 days > 4 hours a day).    She was diagnosed with SLE and is on steroids and Plaquenil and Benlysta.   She has generalized joint pain.   Her right foot/ankle are especially painful.  The [place] controlled database was reviewed and she is compliant and doe not get controlled substances from  other doctors.     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    This improved and she no longer has papilledema.       Update 12/26/2016: HA/neck pain:   She has had a severe headache for the past 5 days. Current pain is in the neck and head, right > left.     Pain is worse with movements and better with bedrest.    She gets nausea but not vomiting.   She notes photo and phonophobia.    She takes 15 mg po tid oxycodone.    It takes the edge off but does not eliminate the pain.   Topamax 200 mg poi bid, tizanidine and baclofen have only  helped a little bit.      Her headaches have done best on Botox in the past. Unfortunately, due to very high co-pays was unable to go back on in the past.  Chronic Migraine:  She is better the last 30 days with only 8 -10 migraines (usually 28-30/30).      Headaches last no more than 4 hours a day.    When present, pain is bilateral and throbbing.   There is often neck pain.  She has photophobia, phonophobia and nausea with headaches.  No vomiting.   Headaches are worse in the afternoons   Moving intensifies the pain.   The combination of Topamax and Keppra works better than Topamax alone.  Neck Pain:  Neck pain is better on steroids.    There is still some right > left pain.  In the past, occipital nerve blocks have helped the neck pain and often the headaches when they become worse.  Pain med's:    Currently, she is on oxycodone 15 mg by mouth 3 times a day and alprazolam 3 times a day. She is also on baclofen and tizanidine for spasticity.   She is on topiramate 200 mg po bid and Keppra for the headaches.  Mood:   Mood is currently doing well. She also thinks anxiety is doing a little bit better. She is less apathetic. She is on duloxetine.  SLE:   She see Dr. Nydia Bouton at [facility].   She was diagnosed with SLE in 2006 and has been on Plaquenil x years and more recently Benlysta.    Her Complements were normal 11/17 but ANA is positive  (speckled)    ------------ Chronic Migraine History:   She has had chronic migraine headache with moderate pain, flaring up to severe,  for most of the past couple of years   She is having chronic migraine 30/30 days every month, lasting for more than 4 hours every day.  She has received some benefit from occipital nerve blocks, trigger point injections and medications but the headaches still persist on a daily basis for past couple years.    She had been on Botox for migraine prophylaxis in the past with benefit. However, with changes in insurance she was unable to continue due to very high co-pay. She is currently on Topamax 200 mg by mouth twice a day and muscle relaxants and opiates.  She has occasional emergency room visits and frequent additional visits to my office for trigger point injections..  She has tried and failed multiple prophylactic medications for her chronic migraine: Antiepileptics:    zonisamide, topiramate, Keppra.   (Due to obesity, Depakote is a poor choice) Anti-depressants  amitriptyline, imipramine Anti-hypertensives:  Diamox Muscle relaxants:  baclofen, tizanidine NSAID's:  She is unable to tolerate chronic anti-inflammatories due to mild renal insufficiency.  She has had multiple visits to ER and to our office for injections when pain is more severe.     ALLERGIES: Allergies  Allergen Reactions  . Nsaids Other (See Comments)    Chronic kidney failure  . Codeine   . Ramipril   . Imitrex [Sumatriptan] Nausea And Vomiting    HOME MEDICATIONS:  Current Outpatient Medications:  .  ADVAIR DISKUS 250-50 MCG/DOSE AEPB, , Disp: , Rfl:  .  albuterol (PROVENTIL HFA;VENTOLIN HFA) 108 (90 Base) MCG/ACT inhaler, Inhale into the lungs., Disp: , Rfl:  .  albuterol (PROVENTIL) (2.5 MG/3ML) 0.083% nebulizer solution, , Disp: , Rfl:  .  Azelastine-Fluticasone 137-50 MCG/ACT SUSP, Place into the nose., Disp: , Rfl:  .  baclofen (LIORESAL) 10 MG tablet, Take 1 to 2 tablets  by  mouth 3 times daily, Disp: 540 tablet, Rfl: 3 .  Belimumab (BENLYSTA) 200 MG/ML SOAJ, Inject into the skin., Disp: , Rfl:  .  bupivacaine (MARCAINE PRESERVATIVE FREE) 0.5 % SOLN injection, Instill 15cc into bladder daily, Disp: , Rfl:  .  busPIRone (BUSPAR) 30 MG tablet, Take 1 tablet (30 mg total) by mouth 2 (two) times daily.,  Disp: 60 tablet, Rfl: 5 .  cetirizine (ZYRTEC) 10 MG tablet, Take 10 mg by mouth., Disp: , Rfl:  .  diltiazem (MATZIM LA) 180 MG 24 hr tablet, Take 180 mg by mouth daily., Disp: , Rfl:  .  DULoxetine (CYMBALTA) 60 MG capsule, Take 1 capsule (60 mg total) by mouth daily., Disp: 90 capsule, Rfl: 4 .  fluconazole (DIFLUCAN) 150 MG tablet, Take 150 mg by mouth., Disp: , Rfl:  .  FLUoxetine (PROZAC) 40 MG capsule, , Disp: , Rfl:  .  furosemide (LASIX) 40 MG tablet, , Disp: , Rfl:  .  glucose blood test strip, , Disp: , Rfl:  .  heparin 09811 UNIT/ML injection, Instill 4 cc into bladder 2x daily, Disp: , Rfl:  .  hydroxychloroquine (PLAQUENIL) 200 MG tablet, Take 1 tablet (200 mg total) by mouth 2 (two) times daily., Disp: 30 tablet, Rfl: 0 .  ipratropium-albuterol (DUONEB) 0.5-2.5 (3) MG/3ML SOLN, 3 mLs., Disp: , Rfl:  .  lactulose, encephalopathy, (CHRONULAC) 10 GM/15ML SOLN, , Disp: , Rfl:  .  montelukast (SINGULAIR) 10 MG tablet, , Disp: , Rfl:  .  oxyCODONE (ROXICODONE) 15 MG immediate release tablet, Take 1 tablet (15 mg total) by mouth every 8 (eight) hours as needed for pain., Disp: 90 tablet, Rfl: 0 .  predniSONE (DELTASONE) 5 MG tablet, , Disp: , Rfl:  .  promethazine (PHENERGAN) 50 MG/ML injection, Frequency:   Dosage:0.0     Instructions:  Note:, Disp: , Rfl:  .  topiramate (TOPAMAX) 200 MG tablet, Take 1 tablet (200 mg total) by mouth 2 (two) times daily., Disp: 180 tablet, Rfl: 4 .  traZODone (DESYREL) 100 MG tablet, Take 1 tablet (100 mg total) by mouth at bedtime., Disp: 90 tablet, Rfl: 3 .  triamcinolone (NASACORT) 55 MCG/ACT AERO nasal inhaler, Place 2  sprays into the nose., Disp: , Rfl:  .  trimethoprim (TRIMPEX) 100 MG tablet, Take 1 tablet by mouth  daily, Disp: , Rfl:  .  Vitamin D, Ergocalciferol, (DRISDOL) 50000 UNITS CAPS capsule, Take 50,000 Units by mouth., Disp: , Rfl:  .  Erenumab-aooe (AIMOVIG) 70 MG/ML SOAJ, Inject 1 Syringe into the skin every 28 (twenty-eight) days., Disp: 3 pen, Rfl: 4  PAST MEDICAL HISTORY: Past Medical History:  Diagnosis Date  . Asthma   . Carpal tunnel syndrome 09/29/2014  . Chronic back pain   . Chronic kidney disease   . Diabetes mellitus without complication (HCC)   . Fatty infiltration of liver 03/17/2015  . Fibromyalgia   . Hypertension   . IBS (irritable bowel syndrome)   . Interstitial cystitis   . Lupus   . Lupus (systemic lupus erythematosus) (HCC)   . Migraines   . Pseudotumor cerebri     PAST SURGICAL HISTORY: Past Surgical History:  Procedure Laterality Date  . ABDOMINAL HYSTERECTOMY    . APPENDECTOMY    . CHOLECYSTECTOMY    . CYSTOSCOPY      FAMILY HISTORY: Family History  Problem Relation Age of Onset  . Graves' disease Mother   . Heart disease Father   . Prostate cancer Father   . Healthy Brother   . Healthy Maternal Aunt   . Healthy Maternal Uncle     SOCIAL HISTORY:  Social History   Socioeconomic History  . Marital status: Married    Spouse name: Not on file  . Number of children: Not on file  . Years of education: Not on file  . Highest education level: Not on  file  Occupational History  . Not on file  Social Needs  . Financial resource strain: Not on file  . Food insecurity:    Worry: Not on file    Inability: Not on file  . Transportation needs:    Medical: Not on file    Non-medical: Not on file  Tobacco Use  . Smoking status: Never Smoker  . Smokeless tobacco: Never Used  Substance and Sexual Activity  . Alcohol use: No  . Drug use: No  . Sexual activity: Not on file  Lifestyle  . Physical activity:    Days per week: Not on file     Minutes per session: Not on file  . Stress: Not on file  Relationships  . Social connections:    Talks on phone: Not on file    Gets together: Not on file    Attends religious service: Not on file    Active member of club or organization: Not on file    Attends meetings of clubs or organizations: Not on file    Relationship status: Not on file  . Intimate partner violence:    Fear of current or ex partner: Not on file    Emotionally abused: Not on file    Physically abused: Not on file    Forced sexual activity: Not on file  Other Topics Concern  . Not on file  Social History Narrative  . Not on file     PHYSICAL EXAM  There were no vitals filed for this visit.  There is no height or weight on file to calculate BMI.   General: The patient is an obese woman in no distress.   Fundoscopic exam shows normsal discs and retinal vessels.   Neck: The neck is supple.  She has just mild tenderness over the splenius capitis muscles and not much tenderness in the cervical paraspinal muscles. Range of motion is a little better in the neck than usual..   Neurologic Exam  Mental status: The patient is alert and oriented x 3 at the time of the examination. The patient has apparent normal recent and remote memory, with an apparently normal attention span and concentration ability.   Speech is normal.  Cranial nerves: Extraocular movements are full.     Facial strength and sensation is normal. Palatal elevation tongue protrusion is midline. Trapezius strength is normal.   Motor:  Muscle bulk is normal.   Tone is normal. Strength is  5 / 5 in limbs  Sensory: She has intact sensation to touch and vibration in the arms and legs.   Gait and station: Station is normal.   Gait is normal.   Reflexes: Deep tendon reflexes are symmetric and normal bilaterally.     DIAGNOSTIC DATA (LABS, IMAGING, TESTING) - I reviewed patient records, labs, notes, testing and imaging myself where  available.  Lab Results  Component Value Date   WBC 5.9 02/19/2015   HGB 12.7 02/19/2015   HCT 39.4 02/19/2015   MCV 89.5 02/19/2015   PLT 255 02/19/2015        ASSESSMENT AND PLAN  Chronic migraine w/o aura w/o status migrainosus, not intractable  Idiopathic intracranial hypertension  Insomnia, unspecified type  Fibromyalgia  Chronic, continuous use of opioids - Plan: Drug Screen, Ur (12+Oxycodone+Crt)   1.     Continue Topamax for the headaches.     Try Emgality for chronic migraine 2.     Continue/renew oxycodone.   We stopped lorazepam at the last  visit to reduce risk of synergistic overdose.      She has been compliant with medications per the [facility] controlled substance database and does not get short substances from other providers at this time. 3.  Although ONB/TPI has helped, she would like to limit steroid dose so will not do at this time.  . 4.  Stay active and exercises as tolerated. 5.  Return to see [place] in 4 months or call sooner if new or worsening neurologic symptoms.   Richard A. Epimenio Foot, MD, PhD 10/27/2017, 9:14 AM Certified in Neurology, Clinical Neurophysiology, Sleep Medicine, Pain Medicine and Neuroimaging  [facility] [address] [place], [place] [address] [phone]

## 2017-10-28 ENCOUNTER — Telehealth: Payer: Self-pay | Admitting: *Deleted

## 2017-10-28 NOTE — Telephone Encounter (Signed)
PA for Aimovig 70mg  SQ q28 days completed via CoverMyMeds. Key# [id number].  Dx: Chronic Migraine (G43.709).  Tried and failed meds: Hydrocodone, Oxycodone, Topamax, Inderal LA, Keppra, Nabumetone, Tizanidine, Baclofen, Botox (no Botox in the last 4 mos), Tylenol, Ibuprofen, Prednisone, Cymbalta, Alprazolam./fim

## 2017-10-28 NOTE — Telephone Encounter (Signed)
Fax received from [employer], phone# [phone]. Aimovig 70mg /ml approved thru 01/28/18 under Medicare Part D benefit.  PA# [id number]

## 2017-10-31 LAB — DRUG SCREEN, UR (12+OXYCODONE+CRT)
Amphetamine Scrn, Ur: NEGATIVE ng/mL
BARBITURATE SCREEN URINE: NEGATIVE ng/mL
BENZODIAZEPINE SCREEN, URINE: NEGATIVE ng/mL
CANNABINOIDS UR QL SCN: NEGATIVE ng/mL
Cocaine (Metab) Scrn, Ur: NEGATIVE ng/mL
Creatinine(Crt), U: 218.5 mg/dL (ref 20.0–300.0)
FENTANYL, URINE: NEGATIVE pg/mL
Meperidine Screen, Urine: NEGATIVE ng/mL
Methadone Screen, Urine: NEGATIVE ng/mL
OXYCODONE+OXYMORPHONE UR QL SCN: POSITIVE — AB
Opiate Scrn, Ur: NEGATIVE ng/mL
PHENCYCLIDINE QUANTITATIVE URINE: NEGATIVE ng/mL
Ph of Urine: 5.6 (ref 4.5–8.9)
Propoxyphene Scrn, Ur: NEGATIVE ng/mL
SPECIFIC GRAVITY: 1.035
Tramadol Screen, Urine: NEGATIVE ng/mL

## 2017-11-27 ENCOUNTER — Telehealth: Payer: Self-pay | Admitting: Neurology

## 2017-11-27 MED ORDER — OXYCODONE HCL 15 MG PO TABS: 15.0000 mg | ORAL_TABLET | Freq: Three times a day (TID) | ORAL | 0 refills | Status: DC | PRN

## 2017-11-27 NOTE — Telephone Encounter (Signed)
Rx. awaiting RAS sig/fim 

## 2017-11-27 NOTE — Telephone Encounter (Signed)
Rx. up front [facility]/fim 

## 2017-11-27 NOTE — Telephone Encounter (Signed)
Pt requesting refills for oxyCODONE (ROXICODONE) 15 MG immediate release tablet, aware the office will be closing at noon tomorrow

## 2017-12-30 ENCOUNTER — Telehealth: Payer: Self-pay | Admitting: Neurology

## 2017-12-30 MED ORDER — OXYCODONE HCL 15 MG PO TABS: 15.0000 mg | ORAL_TABLET | Freq: Three times a day (TID) | ORAL | 0 refills | Status: DC | PRN

## 2017-12-30 NOTE — Telephone Encounter (Signed)
Rx. awaiting RAS sig/fim 

## 2017-12-30 NOTE — Telephone Encounter (Signed)
Pt requesting refills for oxyCODONE (ROXICODONE) 15 MG immediate release tablet

## 2017-12-30 NOTE — Telephone Encounter (Signed)
Rx. up front [facility]/fim 

## 2018-01-20 ENCOUNTER — Telehealth: Payer: Self-pay | Admitting: *Deleted

## 2018-01-20 NOTE — Telephone Encounter (Signed)
Fax received from [employer].  Aimovig PA approved thru 05/19/18 under pt's Medicare Part D benefit.  Ref# [id number]

## 2018-01-20 NOTE — Telephone Encounter (Signed)
PA for Aimovig 70mg  SQ every 28 days completed via Cover My Meds.  Key# [id number].  Dx: Chronic Migraine (G43.709).  Tried and failed meds: Botox (not in the last 4 mos), Topamax, Inderal LA, Keppra, Cymbalta, Ibuprofen, Prednisone, Hydrocodone, Oxycodone, Tizanidine, Baclofen, Alprazolam, Nabumetone./fim

## 2018-01-29 ENCOUNTER — Telehealth: Payer: Self-pay | Admitting: Neurology

## 2018-01-29 MED ORDER — OXYCODONE HCL 15 MG PO TABS: 15.0000 mg | ORAL_TABLET | Freq: Three times a day (TID) | ORAL | 0 refills | Status: DC | PRN

## 2018-01-29 NOTE — Telephone Encounter (Signed)
Pt requesting refills for oxyCODONE (ROXICODONE) 15 MG immediate release tablet °

## 2018-01-29 NOTE — Telephone Encounter (Signed)
Rx. awaiting RAS sig/fim 

## 2018-01-29 NOTE — Addendum Note (Signed)
Addended by: Candis Schatz I on: 01/29/2018 04:50 PM   Modules accepted: Orders

## 2018-01-30 NOTE — Telephone Encounter (Signed)
Rx. up front [facility]/fim 

## 2018-03-03 ENCOUNTER — Telehealth: Payer: Self-pay | Admitting: Neurology

## 2018-03-03 MED ORDER — OXYCODONE HCL 15 MG PO TABS: 15.0000 mg | ORAL_TABLET | Freq: Three times a day (TID) | ORAL | 0 refills | Status: DC | PRN

## 2018-03-03 NOTE — Telephone Encounter (Signed)
Rx. up front [facility]/fim 

## 2018-03-03 NOTE — Telephone Encounter (Signed)
Pt has called for a refill prescription on her oxyCODONE (ROXICODONE) 15 MG immediate release tablet

## 2018-03-03 NOTE — Telephone Encounter (Signed)
Rx. awaiting RAS sig/fim 

## 2018-03-03 NOTE — Addendum Note (Signed)
Addended by: Candis Schatz I on: 03/03/2018 09:27 AM   Modules accepted: Orders

## 2018-03-09 ENCOUNTER — Encounter: Payer: Self-pay | Admitting: Neurology

## 2018-03-09 ENCOUNTER — Other Ambulatory Visit: Payer: Self-pay

## 2018-03-09 ENCOUNTER — Ambulatory Visit: Payer: Medicare Other | Admitting: Neurology

## 2018-03-09 VITALS — BP 140/87 | HR 56 | Ht 65.0 in | Wt 243.0 lb

## 2018-03-09 DIAGNOSIS — G43709 Chronic migraine without aura, not intractable, without status migrainosus: Secondary | ICD-10-CM | POA: Diagnosis not present

## 2018-03-09 DIAGNOSIS — M797 Fibromyalgia: Secondary | ICD-10-CM

## 2018-03-09 DIAGNOSIS — M329 Systemic lupus erythematosus, unspecified: Secondary | ICD-10-CM | POA: Diagnosis not present

## 2018-03-09 DIAGNOSIS — F329 Major depressive disorder, single episode, unspecified: Secondary | ICD-10-CM

## 2018-03-09 DIAGNOSIS — F1190 Opioid use, unspecified, uncomplicated: Secondary | ICD-10-CM

## 2018-03-09 DIAGNOSIS — F32A Depression, unspecified: Secondary | ICD-10-CM

## 2018-03-09 DIAGNOSIS — G4700 Insomnia, unspecified: Secondary | ICD-10-CM

## 2018-03-09 MED ORDER — DULOXETINE HCL 60 MG PO CPEP: 60.0000 mg | ORAL_CAPSULE | Freq: Every day | ORAL | 4 refills | Status: AC

## 2018-03-09 MED ORDER — TRAZODONE HCL 100 MG PO TABS: 100.0000 mg | ORAL_TABLET | Freq: Every day | ORAL | 3 refills | Status: DC

## 2018-03-09 NOTE — Progress Notes (Signed)
[facility]  PATIENT: Connie Arnold DOB: [birth year]     HISTORICAL  CHIEF COMPLAINT:  Chief Complaint  Patient presents with  . Follow-up    RM 11, alone. Last seen 10/27/17. Here to f/u on migraines. Taking topamax, emgality.  Has a slight headache every day, more when she first wakes up. Closer to the end of the day, she starts to get a headache around 6 or 7pm. During the day they have improved. She feels emgality has helped a little. No significant change in migraines.   . Insomnia    Taking melatonin at night but waking up at 5am no matter what time she goes to bed.    HISTORY OF PRESENT ILLNESS:  Connie Arnold is a 52 y.o. woman with chronic migraine headaches and other neurologic issues.    Update 03/09/2018: She is still having daily headaches though the intensity is better since starting Aimovig.    HA's are present when she wakes up and improve some days as the day goes on and then it worsens in the evening again.    She gets some nausea but no vomiting.   She has photophobia and phonophobia.   Laying down helps the headaches when they get worse.She is also on Topamax with some benefit.       Fibromyalgia is a little better as she is doing some exercise which may have helped.    She takes oxycodone and tolerates it well.    She has not escalated and is not getting opiates from other doctors. We stopped her benzodiazepines earlier this year to reduce risk..   [place] Database has been reviewed.    She is on Benlysta and Plaquenil for her SLE.   Some of her pain could be coming from that as well.    Her depression is doing ok on Cymbalta and she tolerates it well.  Her insomnia is doing worse with sleep maintenance issues after 4-5 hours of sleep.      Update 10/27/2017: She continues to have migraine headaches on a daily basis.   These occur across her scalp.     HA's improve with sleep and get worse if she is sleep deprived.  Pain is throbbing.   She  gets nausea but no vomiting.    She has photo/phonophobia    Also, she gets benefit from the splenius capitus TPI/ONBs performed every 4 months or so.,     In the past, Botox had helped.   Her insurance does not cover it well.   They also did not cover Ajovy.      FMS pain is about the same.    Oxycodone helps pain some and is well tolerated.  [place] database reviewed and she is compliant and not getting controlled substances elsewhere.   No drug seeking behavior.     She also has lupus which may be playing a role  She still has trouble falling asleep.   At the last visit, we simplified the controlled substances by elinminating hte benzodiazepine and continuing oxycodone.   Trazodone may have helped some but is out.  Melatonin helps her fall asleep.      Update 06/27/17: She feels her headaches are doing about the same.   She has more pain in the neck and upper back.   Neck pain radiates into the back of the head.   In the past, TPI's or nerve blocks have helped her pain.    She switched from  Topamax to Keppra but feels the Topamax helped more.    She is also on muscle relaxants.   HA's are back to daily (30/30 days > 4 hours a day).    She was diagnosed with SLE and is on steroids and Plaquenil and Benlysta.   She has generalized joint pain.   Her right foot/ankle are especially painful.  The  controlled database was reviewed and she is compliant and doe not get controlled substances from other doctors.     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    This improved and she no longer has papilledema.       Update 12/26/2016: HA/neck pain:   She has had a severe headache for the past 5 days. Current pain is in the neck and head, right > left.     Pain is worse with movements and better with bedrest.    She gets nausea but not vomiting.   She notes photo and phonophobia.    She takes 15 mg po tid oxycodone.    It takes the edge off but does not eliminate the pain.   Topamax 200  mg poi bid, tizanidine and baclofen have only  helped a little bit.      Her headaches have done best on Botox in the past. Unfortunately, due to very high co-pays was unable to go back on in the past.  Chronic Migraine:  She is better the last 30 days with only 8 -10 migraines (usually 28-30/30).      Headaches last no more than 4 hours a day.    When present, pain is bilateral and throbbing.   There is often neck pain.    She has photophobia, phonophobia and nausea with headaches.  No vomiting.   Headaches are worse in the afternoons   Moving intensifies the pain.   The combination of Topamax and Keppra works better than Topamax alone.  Neck Pain:  Neck pain is better on steroids.    There is still some right > left pain.  In the past, occipital nerve blocks have helped the neck pain and often the headaches when they become worse.  Pain med's:    Currently, she is on oxycodone 15 mg by mouth 3 times a day and alprazolam 3 times a day. She is also on baclofen and tizanidine for spasticity.   She is on topiramate 200 mg po bid and Keppra for the headaches.  Mood:   Mood is currently doing well. She also thinks anxiety is doing a little bit better. She is less apathetic. She is on duloxetine.  SLE:   She see Dr. Nydia Bouton at [facility].   She was diagnosed with SLE in 2006 and has been on Plaquenil x years and more recently Benlysta.    Her Complements were normal 11/17 but ANA is positive (speckled)    ------------ Chronic Migraine History:   She has had chronic migraine headache with moderate pain, flaring up to severe,  for most of the past couple of years   She is having chronic migraine 30/30 days every month, lasting for more than 4 hours every day.  She has received some benefit from occipital nerve blocks, trigger point injections and medications but the headaches still persist on a daily basis for past couple years.    She had been on Botox for migraine prophylaxis in the past with benefit. However, with  changes in insurance she was unable  to continue due to very high co-pay. She is currently on Topamax 200 mg by mouth twice a day and muscle relaxants and opiates.  She has occasional emergency room visits and frequent additional visits to my office for trigger point injections..  She has tried and failed multiple prophylactic medications for her chronic migraine: Antiepileptics:    zonisamide, topiramate, Keppra.   (Due to obesity, Depakote is a poor choice) Anti-depressants  amitriptyline, imipramine Anti-hypertensives:  Diamox Muscle relaxants:  baclofen, tizanidine NSAID's:  She is unable to tolerate chronic anti-inflammatories due to mild renal insufficiency.  She has had multiple visits to ER and to our office for injections when pain is more severe.     ALLERGIES: Allergies  Allergen Reactions  . Nsaids Other (See Comments)    Chronic kidney failure  . Codeine   . Ramipril   . Imitrex [Sumatriptan] Nausea And Vomiting    HOME MEDICATIONS:  Current Outpatient Medications:  .  ADVAIR DISKUS 250-50 MCG/DOSE AEPB, , Disp: , Rfl:  .  albuterol (PROVENTIL HFA;VENTOLIN HFA) 108 (90 Base) MCG/ACT inhaler, Inhale into the lungs., Disp: , Rfl:  .  albuterol (PROVENTIL) (2.5 MG/3ML) 0.083% nebulizer solution, , Disp: , Rfl:  .  Azelastine-Fluticasone 137-50 MCG/ACT SUSP, Place into the nose., Disp: , Rfl:  .  baclofen (LIORESAL) 10 MG tablet, Take 1 to 2 tablets by  mouth 3 times daily, Disp: 540 tablet, Rfl: 3 .  Belimumab (BENLYSTA) 200 MG/ML SOAJ, Inject into the skin., Disp: , Rfl:  .  bupivacaine (MARCAINE PRESERVATIVE FREE) 0.5 % SOLN injection, Instill 15cc into bladder daily, Disp: , Rfl:  .  busPIRone (BUSPAR) 30 MG tablet, Take 1 tablet (30 mg total) by mouth 2 (two) times daily., Disp: 60 tablet, Rfl: 5 .  cetirizine (ZYRTEC) 10 MG tablet, Take 10 mg by mouth., Disp: , Rfl:  .  diltiazem (MATZIM LA) 180 MG 24 hr tablet, Take 180 mg by mouth daily., Disp: , Rfl:  .   DULoxetine (CYMBALTA) 60 MG capsule, Take 1 capsule (60 mg total) by mouth daily., Disp: 90 capsule, Rfl: 4 .  Erenumab-aooe (AIMOVIG) 70 MG/ML SOAJ, Inject 1 Syringe into the skin every 28 (twenty-eight) days., Disp: 3 pen, Rfl: 4 .  fluconazole (DIFLUCAN) 150 MG tablet, Take 150 mg by mouth., Disp: , Rfl:  .  FLUoxetine (PROZAC) 40 MG capsule, , Disp: , Rfl:  .  furosemide (LASIX) 40 MG tablet, , Disp: , Rfl:  .  glucose blood test strip, , Disp: , Rfl:  .  heparin [address] UNIT/ML injection, Instill 4 cc into bladder 2x daily, Disp: , Rfl:  .  hydroxychloroquine (PLAQUENIL) 200 MG tablet, Take 1 tablet (200 mg total) by mouth 2 (two) times daily., Disp: 30 tablet, Rfl: 0 .  ipratropium-albuterol (DUONEB) 0.5-2.5 (3) MG/3ML SOLN, 3 mLs., Disp: , Rfl:  .  lactulose, encephalopathy, (CHRONULAC) 10 GM/15ML SOLN, , Disp: , Rfl:  .  Melatonin 5 MG TABS, Take 1 tablet by mouth at bedtime., Disp: , Rfl:  .  montelukast (SINGULAIR) 10 MG tablet, , Disp: , Rfl:  .  oxyCODONE (ROXICODONE) 15 MG immediate release tablet, Take 1 tablet (15 mg total) by mouth every 8 (eight) hours as needed for pain., Disp: 90 tablet, Rfl: 0 .  predniSONE (DELTASONE) 5 MG tablet, , Disp: , Rfl:  .  promethazine (PHENERGAN) 50 MG/ML injection, Frequency:   Dosage:0.0     Instructions:  Note:, Disp: , Rfl:  .  topiramate (TOPAMAX) 200  MG tablet, Take 1 tablet (200 mg total) by mouth 2 (two) times daily., Disp: 180 tablet, Rfl: 4 .  traZODone (DESYREL) 100 MG tablet, Take 1 tablet (100 mg total) by mouth at bedtime., Disp: 90 tablet, Rfl: 3 .  triamcinolone (NASACORT) 55 MCG/ACT AERO nasal inhaler, Place 2 sprays into the nose., Disp: , Rfl:  .  trimethoprim (TRIMPEX) 100 MG tablet, Take 1 tablet by mouth  daily, Disp: , Rfl:  .  Vitamin D, Ergocalciferol, (DRISDOL) 50000 UNITS CAPS capsule, Take 50,000 Units by mouth., Disp: , Rfl:   PAST MEDICAL HISTORY: Past Medical History:  Diagnosis Date  . Asthma   . Carpal tunnel  syndrome 09/29/2014  . Chronic back pain   . Chronic kidney disease   . Diabetes mellitus without complication (HCC)   . Fatty infiltration of liver 03/17/2015  . Fibromyalgia   . Hypertension   . IBS (irritable bowel syndrome)   . Interstitial cystitis   . Lupus (HCC)   . Lupus (systemic lupus erythematosus) (HCC)   . Migraines   . Pseudotumor cerebri     PAST SURGICAL HISTORY: Past Surgical History:  Procedure Laterality Date  . ABDOMINAL HYSTERECTOMY    . APPENDECTOMY    . CHOLECYSTECTOMY    . CYSTOSCOPY      FAMILY HISTORY: Family History  Problem Relation Age of Onset  . Graves' disease Mother   . Heart disease Father   . Prostate cancer Father   . Healthy Brother   . Healthy Maternal Aunt   . Healthy Maternal Uncle     SOCIAL HISTORY:  Social History   Socioeconomic History  . Marital status: Married    Spouse name: Not on file  . Number of children: Not on file  . Years of education: Not on file  . Highest education level: Not on file  Occupational History  . Not on file  Social Needs  . Financial resource strain: Not on file  . Food insecurity:    Worry: Not on file    Inability: Not on file  . Transportation needs:    Medical: Not on file    Non-medical: Not on file  Tobacco Use  . Smoking status: Never Smoker  . Smokeless tobacco: Never Used  Substance and Sexual Activity  . Alcohol use: No  . Drug use: No  . Sexual activity: Not on file  Lifestyle  . Physical activity:    Days per week: Not on file    Minutes per session: Not on file  . Stress: Not on file  Relationships  . Social connections:    Talks on phone: Not on file    Gets together: Not on file    Attends religious service: Not on file    Active member of club or organization: Not on file    Attends meetings of clubs or organizations: Not on file    Relationship status: Not on file  . Intimate partner violence:    Fear of current or ex partner: Not on file    Emotionally  abused: Not on file    Physically abused: Not on file    Forced sexual activity: Not on file  Other Topics Concern  . Not on file  Social History Narrative  . Not on file     PHYSICAL EXAM  Vitals:   03/09/18 0827  BP: 140/87  Pulse: (!) 56  Weight: 243 lb (110.2 kg)  Height: 5\' 5"  (1.651 m)    Body  mass index is 40.44 kg/m.   General: The patient is an obese woman in no distress.   Fundoscopic exam shows normsal discs and retinal vessels.   Neck: The neck is supple.  She has mild tenderness over the splenius capitis muscles Range of motion is ok in the neck.    Some FMS tender point tenderness to deep palpation  Neurologic Exam  Mental status: The patient is alert and oriented x 3 at the time of the examination. The patient has apparent normal recent and remote memory, with an apparently normal attention span and concentration ability.   Speech is normal.  Cranial nerves: Extraocular movements are full.     Facial strength and sensation  is normal. Palatal elevation tongue protrusion is midline. Trapezius strength is normal.   Motor:  Muscle bulk is normal.   Tone is normal. Strength is  5 / 5 in limbs  Sensory: She has intact sensation to touch in the legs and arms  Gait and station: Station is normal.   Gait is normal.   Tandem gait is ok  Reflexes: Deep tendon reflexes are symmetric and normal bilaterally.     DIAGNOSTIC DATA (LABS, IMAGING, TESTING) - I reviewed patient records, labs, notes, testing and imaging myself where available.  Lab Results  Component Value Date   WBC 5.9 02/19/2015   HGB 12.7 02/19/2015   HCT 39.4 02/19/2015   MCV 89.5 02/19/2015   PLT 255 02/19/2015        ASSESSMENT AND PLAN  Chronic migraine w/o aura w/o status migrainosus, not intractable  Fibromyalgia  Systemic lupus erythematosus, unspecified SLE type, unspecified organ involvement status (HCC)  Depression, unspecified depression type  Insomnia, unspecified  type  Chronic, continuous use of opioids   1.     Continue Aimovig Topamax for the headaches.      2.     She will continue oxycodone.     She has been compliant with medications per the [facility] controlled substance database and does not get short substances from other providers at this time.   We stopped her benzodiazepine to reduce risk.   3.  HA is mild now and she does not need a TPI 4.  Stay active and exercises as tolerated. 5.  Return to see [place] in 4 months or call sooner if new or worsening neurologic symptoms.   Richard A. Epimenio Foot, MD, PhD 03/09/2018, 8:49 AM Certified in Neurology, Clinical Neurophysiology, Sleep Medicine, Pain Medicine and Neuroimaging  [facility] [address] [place], [place] [address] [phone]

## 2018-04-01 ENCOUNTER — Other Ambulatory Visit: Payer: Self-pay | Admitting: Neurology

## 2018-04-01 MED ORDER — OXYCODONE HCL 15 MG PO TABS: 15.0000 mg | ORAL_TABLET | Freq: Three times a day (TID) | ORAL | 0 refills | Status: DC | PRN

## 2018-04-01 NOTE — Telephone Encounter (Signed)
Checked drug registry and she last refilled 03/09/18 #90. Not receiving from other MD's. Last seen 03/09/18 and next f/u 07/16/18. Rx printed for Dr. Epimenio Foot signature when he returns to office.

## 2018-04-01 NOTE — Telephone Encounter (Signed)
Pt requesting refills for oxyCODONE (ROXICODONE) 15 MG immediate release tablet aware Dr. Epimenio Foot is out until Friday 11/15

## 2018-04-03 NOTE — Telephone Encounter (Signed)
Rx signed and placed up front for pickup. 

## 2018-04-03 NOTE — Telephone Encounter (Signed)
Spoke with pharmacy. They are aware prescription for Oxycodone 15 mg was last written on 03/03/18 & filled on 03/04/18. Pt ok to fill on 04/04/18. He verbalized understanding and appreciation.

## 2018-04-03 NOTE — Telephone Encounter (Signed)
Pt calling re: the script for this medication oxyCODONE (ROXICODONE)  Pt states the date on this prescription is 11-20. She sates her last script from last month was dated on the 16th.  She is asking for a call to discuss why

## 2018-04-03 NOTE — Telephone Encounter (Signed)
Spoke with patient and checked [place] drug registry. Oxycodone 15 mg tablet was last filled #90 from Dr. Epimenio Foot on 03/04/18. Pt aware prescription is up front for pickup and she should be able to fill it tomorrow. She was very Adult nurse. She is aware office closes at noon and she stated she is able to come before close.

## 2018-04-13 DIAGNOSIS — M654 Radial styloid tenosynovitis [de Quervain]: Secondary | ICD-10-CM | POA: Insufficient documentation

## 2018-04-30 ENCOUNTER — Telehealth: Payer: Self-pay | Admitting: Neurology

## 2018-04-30 NOTE — Telephone Encounter (Signed)
I spoke with Connie Arnold.  Her Oxycodone is not due until 12/20. She will call back closer to the due date/fim

## 2018-04-30 NOTE — Telephone Encounter (Signed)
Pt requesting refills for oxyCODONE (ROXICODONE) 15 MG immediate release tablet aware the office closes at noon tomorrow

## 2018-05-06 ENCOUNTER — Telehealth: Payer: Self-pay | Admitting: Neurology

## 2018-05-06 ENCOUNTER — Telehealth: Payer: Self-pay | Admitting: *Deleted

## 2018-05-06 MED ORDER — OXYCODONE HCL 15 MG PO TABS: 15.0000 mg | ORAL_TABLET | Freq: Three times a day (TID) | ORAL | 0 refills | Status: DC | PRN

## 2018-05-06 NOTE — Telephone Encounter (Signed)
Checked drug registry. She last refilled 04/03/18 #90. Not receiving from other MD's. Last seen 03/09/18 and next f/u 07/16/18. Rx printed, waiting on MD signature

## 2018-05-06 NOTE — Telephone Encounter (Signed)
Submitted PA Aimovig on covermymeds. Key: [id number].  Waiting on determination from [employer]. Dx: chronic migraines  Pt has tried/failed: Botox, Topamax, Inderal LA, Keppra, Cymbalta, Ibuprofen, Prednisone, Hydrocodone, Oxycodone, Tizanidine, Baclofen, Alprazolam, Nabumetone

## 2018-05-06 NOTE — Telephone Encounter (Signed)
Pt has called for a refill on her oxyCODONE (ROXICODONE) 15 MG immediate release tablet

## 2018-05-06 NOTE — Telephone Encounter (Signed)
Placed printed/signed rx up front for pt pick up. 

## 2018-05-06 NOTE — Addendum Note (Signed)
Addended by: Hillis Range on: 05/06/2018 01:28 PM   Modules accepted: Orders

## 2018-06-03 ENCOUNTER — Other Ambulatory Visit: Payer: Self-pay | Admitting: Neurology

## 2018-06-03 MED ORDER — OXYCODONE HCL 15 MG PO TABS: 15.0000 mg | ORAL_TABLET | Freq: Three times a day (TID) | ORAL | 0 refills | Status: DC | PRN

## 2018-06-03 NOTE — Addendum Note (Signed)
Addended by: Hillis Range on: 06/03/2018 04:43 PM   Modules accepted: Orders

## 2018-06-03 NOTE — Telephone Encounter (Signed)
Drug registry checked. Pt last refilled 05/07/18 #90, not receiving from other MD's. Last seen 03/09/18 and next f/u 07/16/18. Refill request sent to MD to escribe.  I called pt because she is requesting refill to go to [facility]. However, she has been filling at [facility]. Advised she has to continue to use one pharmacy to fill medications. She is going to continue to refill at [facility]. Advised I will send request to Dr. Epimenio Foot.

## 2018-06-03 NOTE — Telephone Encounter (Signed)
Pt has called for refill on oxyCODONE (ROXICODONE) 15 MG immediate release tablet  [facility] [phone]

## 2018-06-29 ENCOUNTER — Encounter: Payer: Self-pay | Admitting: Neurology

## 2018-07-02 ENCOUNTER — Other Ambulatory Visit: Payer: Self-pay | Admitting: Neurology

## 2018-07-02 MED ORDER — OXYCODONE HCL 15 MG PO TABS: 15.0000 mg | ORAL_TABLET | Freq: Three times a day (TID) | ORAL | 0 refills | Status: DC | PRN

## 2018-07-02 NOTE — Telephone Encounter (Signed)
Pt is needing a refill for her oxyCODONE (ROXICODONE) 15 MG immediate release tablet sent to [facility]

## 2018-07-16 ENCOUNTER — Ambulatory Visit: Payer: Self-pay | Admitting: Neurology

## 2018-07-31 ENCOUNTER — Other Ambulatory Visit: Payer: Self-pay | Admitting: Neurology

## 2018-07-31 MED ORDER — OXYCODONE HCL 15 MG PO TABS: 15.0000 mg | ORAL_TABLET | Freq: Three times a day (TID) | ORAL | 0 refills | Status: DC | PRN

## 2018-07-31 NOTE — Addendum Note (Signed)
Addended by: Hillis Range on: 07/31/2018 12:19 PM   Modules accepted: Orders

## 2018-07-31 NOTE — Telephone Encounter (Signed)
Pt has called for a refill on her oxyCODONE (ROXICODONE) 15 MG immediate release tablet  [facility]  

## 2018-08-25 DIAGNOSIS — E669 Obesity, unspecified: Secondary | ICD-10-CM | POA: Insufficient documentation

## 2018-08-31 ENCOUNTER — Telehealth: Payer: Self-pay | Admitting: *Deleted

## 2018-08-31 NOTE — Telephone Encounter (Signed)
Called pt, updated her med list, pharmacy and allergies on file.   She now weighs 250lb and wanting to discuss trying phentermine again. She was previously prescribed this back in 2016 by Dr. Epimenio Foot and said it worked well.   She is no longer on Aimovig. States insurance no longer covering. We did not receive PA request. She is going to talk with Dr. Epimenio Foot first because she believes it caused constipation, weight gain, joint pain.

## 2018-09-01 ENCOUNTER — Ambulatory Visit (INDEPENDENT_AMBULATORY_CARE_PROVIDER_SITE_OTHER): Payer: Medicare Other | Admitting: Neurology

## 2018-09-01 ENCOUNTER — Telehealth: Payer: Self-pay | Admitting: Neurology

## 2018-09-01 ENCOUNTER — Other Ambulatory Visit: Payer: Self-pay

## 2018-09-01 ENCOUNTER — Encounter: Payer: Self-pay | Admitting: Neurology

## 2018-09-01 DIAGNOSIS — G43709 Chronic migraine without aura, not intractable, without status migrainosus: Secondary | ICD-10-CM

## 2018-09-01 DIAGNOSIS — M797 Fibromyalgia: Secondary | ICD-10-CM

## 2018-09-01 DIAGNOSIS — M542 Cervicalgia: Secondary | ICD-10-CM | POA: Diagnosis not present

## 2018-09-01 DIAGNOSIS — G4700 Insomnia, unspecified: Secondary | ICD-10-CM

## 2018-09-01 DIAGNOSIS — G932 Benign intracranial hypertension: Secondary | ICD-10-CM

## 2018-09-01 MED ORDER — OXYCODONE HCL 15 MG PO TABS: 15.0000 mg | ORAL_TABLET | Freq: Three times a day (TID) | ORAL | 0 refills | Status: DC | PRN

## 2018-09-01 MED ORDER — PHENTERMINE HCL 37.5 MG PO CAPS: 37.5000 mg | ORAL_CAPSULE | ORAL | 5 refills | Status: DC

## 2018-09-01 NOTE — Progress Notes (Signed)
[facility]  PATIENT: Connie Arnold DOB: July 28, 1965     HISTORICAL  CHIEF COMPLAINT:  Chief Complaint  Patient presents with  . Pain  . Migraine    HISTORY OF PRESENT ILLNESS:  Connie Arnold is a 53 y.o. woman with chronic migraine headaches and other neurologic issues.    Update 09/01/2018: Virtual Visit via Video Note I connected with Connie Arnold on 09/01/18 at  9:00 AM EDT by a video enabled telemedicine application and verified that I am speaking with the correct person.   I discussed the limitations of evaluation and management by telemedicine and the availability of in person appointments. The patient expressed understanding and agreed to proceed.  History of Present Illness: Her migraines are doing about the same.   She tried Aimovig but her insurance isn't covering it well and her co-pay is high.   Botox was not covered well by her.   She has HA's 30/30 days > 4 hours a day and the migraines are associated with throbbing pain, nausea, photophobia (esp early in day), phonophobia.   Moving he head worsens the pain.  Topiramate reduced the intensity but they still occur practically every day.   Botox helped the most.     She gets mild depression.   This seems worse since staying indoors more.   She still has poor sleep despite trazodone.     Neck pain is doing about the same.  She has fibromyalgia and feels pain is doing about the same on current medications.  She is on Oxycodone 15 mg po tid. the [place] controlled substance database did not show any aberrant prescriptions.  She is trying to socially distance and gets out to shop.   She is on Plaquenil for SLE.     Observations/Objective: She is a well-developed well-nourished woman in no acute distress.  The head is normocephalic and atraumatic.  The neck has good range of motion.  Sclera are anicteric.  Tongue and pharynx appear normal.  Visible skin appears normal.  She is alert and  oriented with fluent speech and good attention, knowledge of memory.  Extraocular muscles are intact.  Facial strength appears normal.  Trapezius strength appears normal.  Hearing appears normal.  In the arm she appears to have normal and symmetric strength with good rapid alternating movements and finger-nose-finger.  Assessment and Plan: Chronic migraine without aura without status migrainosus, not intractable  Idiopathic intracranial hypertension  Neck pain  Insomnia, unspecified type  Fibromyalgia  1.   For chronic pain continue oxycodone. 2.   We discussed seeing if Botox is on her insurances formulary.  She had done best on Botox for a couple years in the past. 3.   Phentermine 37.5 to help with weight loss.  She has gained 20 pounds this year.  She has a history of pseudotumor cerebri though recent ophthalmologic exam did not show any papilledema 4.    Return in 4 months or sooner if there are new or worsening neurologic symptoms.  Follow Up Instructions: I discussed the assessment and treatment plan with the patient. The patient was provided an opportunity to ask questions and all were answered. The patient agreed with the plan and demonstrated an understanding of the instructions.   The patient was advised to call back or seek an in-person evaluation if the symptoms worsen or if the condition fails to improve as anticipated.  I provided 25 minutes of non-face-to-face time during this encounter.   __________________  From previous visits Update 03/09/2018: She is still having daily headaches though the intensity is better since starting Aimovig.    HA's are present when she wakes up and improve some days as the day goes on and then it worsens in the evening again.    She gets some nausea but no vomiting.   She has photophobia and phonophobia.   Laying down helps the headaches when they get worse.She is also on Topamax with some benefit.       Fibromyalgia is a little better as  she is doing some exercise which may have helped.    She takes oxycodone and tolerates it well.    She has not escalated and is not getting opiates from other doctors. We stopped her benzodiazepines earlier this year to reduce risk..   [place] [facility] Database has been reviewed.    She is on Benlysta and Plaquenil for her SLE.   Some of her pain could be coming from that as well.    Her depression is doing ok on Cymbalta and she tolerates it well.  Her insomnia is doing worse with sleep maintenance issues after 4-5 hours of sleep.      Update 10/27/2017: She continues to have migraine headaches on a daily basis.   These occur across her scalp.     HA's improve with sleep and get worse if she is sleep deprived.  Pain is throbbing.   She gets nausea but no vomiting.    She has photo/phonophobia    Also, she gets benefit from the splenius capitus TPI/ONBs performed every 4 months or so.,     In the past, Botox had helped.   Her insurance does not cover it well.   They also did not cover Ajovy.      FMS pain is about the same.    Oxycodone helps pain some and is well tolerated.  [place] database reviewed and she is compliant and not getting controlled substances elsewhere.   No drug seeking behavior.     She also has lupus which may be playing a role  She still has trouble falling asleep.   At the last visit, we simplified the controlled substances by elinminating hte benzodiazepine and continuing oxycodone.   Trazodone may have helped some but is out.  Melatonin helps her fall asleep.      Update 06/27/17: She feels her headaches are doing about the same.   She has more pain in the neck and upper back.   Neck pain radiates into the back of the head.   In the past, TPI's or nerve blocks have helped her pain.    She switched from Topamax to Keppra but feels the Topamax helped more.    She is also on muscle relaxants.   HA's are back to daily (30/30 days > 4 hours a day).    She was diagnosed with SLE and is on steroids  and Plaquenil and Benlysta.   She has generalized joint pain.   Her right foot/ankle are especially painful.  The [place] controlled database was reviewed and she is compliant and doe not get controlled substances from other doctors.     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    This improved and she no longer has papilledema.       Update 12/26/2016: HA/neck pain:   She has had a severe headache for the past 5 days. Current pain is in the  neck and head, right > left.     Pain is worse with movements and better with bedrest.    She gets nausea but not vomiting.   She notes photo and phonophobia.    She takes 15 mg po tid oxycodone.    It takes the edge off but does not eliminate the pain.   Topamax 200 mg poi bid, tizanidine and baclofen have only  helped a little bit.      Her headaches have done best on Botox in the past. Unfortunately, due to very high co-pays was unable to go back on in the past.  Chronic Migraine:  She is better the last 30 days with only 8 -10 migraines (usually 28-30/30).      Headaches last no more than 4 hours a day.    When present, pain is bilateral and throbbing.   There is often neck pain.    She has photophobia, phonophobia and nausea with headaches.  No vomiting.   Headaches are worse in the afternoons   Moving intensifies the pain.   The combination of Topamax and Keppra works better than Topamax alone.  Neck Pain:  Neck pain is better on steroids.    There is still some right > left pain.  In the past, occipital nerve blocks have helped the neck pain and often the headaches when they become worse.  Pain med's:    Currently, she is on oxycodone 15 mg by mouth 3 times a day and alprazolam 3 times a day. She is also on baclofen and tizanidine for spasticity.   She is on topiramate 200 mg po bid and Keppra for the headaches.  Mood:   Mood is currently doing well. She also thinks anxiety is doing a little bit better. She is less apathetic. She is  on duloxetine.  SLE:   She see Dr. Nydia Bouton at [facility].   She was diagnosed with SLE in 2006 and has been on Plaquenil x years and more recently Benlysta.    Her Complements were normal 11/17 but ANA is positive (speckled)    ------------ Chronic Migraine History:   She has had chronic migraine headache with moderate pain, flaring up to severe,  for most of the past couple of years   She is having chronic migraine 30/30 days every month, lasting for more than 4 hours every day.  She has received some benefit from occipital nerve blocks, trigger point injections and medications but the headaches still persist on a daily basis for past couple years.    She had been on Botox for migraine prophylaxis in the past with benefit. However, with changes in insurance she was unable to continue due to very high co-pay. She is currently on Topamax 200 mg by mouth twice a day and muscle relaxants and opiates.  She has occasional emergency room visits and frequent additional visits to my office for trigger point injections..  She has tried and failed multiple prophylactic medications for her chronic migraine: Antiepileptics:    zonisamide, topiramate, Keppra.   (Due to obesity, Depakote is a poor choice) Anti-depressants  amitriptyline, imipramine Anti-hypertensives:  Diamox Muscle relaxants:  baclofen, tizanidine NSAID's:  She is unable to tolerate chronic anti-inflammatories due to mild renal insufficiency.  She has had multiple visits to ER and to our office for injections when pain is more severe.     ALLERGIES: Allergies  Allergen Reactions  . Nsaids Other (See Comments)    Chronic kidney failure  . Codeine   .  Ramipril   . Imitrex [Sumatriptan] Nausea And Vomiting    HOME MEDICATIONS:  Current Outpatient Medications:  .  ADVAIR DISKUS 250-50 MCG/DOSE AEPB, Inhale 1 puff into the lungs as needed. , Disp: , Rfl:  .  albuterol (PROVENTIL HFA;VENTOLIN HFA) 108 (90 Base) MCG/ACT inhaler, Inhale into the  lungs., Disp: , Rfl:  .  albuterol (PROVENTIL) (2.5 MG/3ML) 0.083% nebulizer solution, , Disp: , Rfl:  .  Azelastine-Fluticasone 137-50 MCG/ACT SUSP, Place 1 spray into the nose as needed. , Disp: , Rfl:  .  baclofen (LIORESAL) 10 MG tablet, Take 1 to 2 tablets by  mouth 3 times daily, Disp: 540 tablet, Rfl: 3 .  Belimumab (BENLYSTA) 200 MG/ML SOAJ, Inject 1 Dose into the skin once a week. , Disp: , Rfl:  .  bupivacaine (MARCAINE PRESERVATIVE FREE) 0.5 % SOLN injection, Instill 15cc into bladder daily, Disp: , Rfl:  .  cetirizine (ZYRTEC) 10 MG tablet, Take 10 mg by mouth daily. , Disp: , Rfl:  .  DULoxetine (CYMBALTA) 60 MG capsule, Take 1 capsule (60 mg total) by mouth daily., Disp: 90 capsule, Rfl: 4 .  fluconazole (DIFLUCAN) 150 MG tablet, Take 150 mg by mouth once. , Disp: , Rfl:  .  FLUoxetine (PROZAC) 40 MG capsule, Take 40 mg by mouth daily. , Disp: , Rfl:  .  furosemide (LASIX) 40 MG tablet, Take 40 mg by mouth daily. , Disp: , Rfl:  .  glucose blood test strip, , Disp: , Rfl:  .  heparin 95621 UNIT/ML injection, Instill 4 cc into bladder 2x daily, Disp: , Rfl:  .  hydroxychloroquine (PLAQUENIL) 200 MG tablet, Take 1 tablet (200 mg total) by mouth 2 (two) times daily., Disp: 30 tablet, Rfl: 0 .  ipratropium-albuterol (DUONEB) 0.5-2.5 (3) MG/3ML SOLN, 3 mLs as needed. , Disp: , Rfl:  .  lactulose, encephalopathy, (CHRONULAC) 10 GM/15ML SOLN, , Disp: , Rfl:  .  montelukast (SINGULAIR) 10 MG tablet, Take 10 mg by mouth at bedtime. , Disp: , Rfl:  .  oxyCODONE (ROXICODONE) 15 MG immediate release tablet, Take 1 tablet (15 mg total) by mouth every 8 (eight) hours as needed for pain., Disp: 90 tablet, Rfl: 0 .  phentermine 37.5 MG capsule, Take 1 capsule (37.5 mg total) by mouth every morning., Disp: 30 capsule, Rfl: 5 .  promethazine (PHENERGAN) 50 MG/ML injection, Inject into the muscle as needed. , Disp: , Rfl:  .  topiramate (TOPAMAX) 200 MG tablet, Take 1 tablet (200 mg total) by mouth 2  (two) times daily., Disp: 180 tablet, Rfl: 4 .  traZODone (DESYREL) 100 MG tablet, Take 1 tablet (100 mg total) by mouth at bedtime., Disp: 90 tablet, Rfl: 3 .  triamcinolone (NASACORT) 55 MCG/ACT AERO nasal inhaler, Place 2 sprays into the nose., Disp: , Rfl:  .  trimethoprim (TRIMPEX) 100 MG tablet, Take 1 tablet by mouth  daily, Disp: , Rfl:  .  Vitamin D, Ergocalciferol, (DRISDOL) 50000 UNITS CAPS capsule, Take 50,000 Units by mouth., Disp: , Rfl:   PAST MEDICAL HISTORY: Past Medical History:  Diagnosis Date  . Asthma   . Carpal tunnel syndrome 09/29/2014  . Chronic back pain   . Chronic kidney disease   . Diabetes mellitus without complication (HCC)   . Fatty infiltration of liver 03/17/2015  . Fibromyalgia   . Hypertension   . IBS (irritable bowel syndrome)   . Interstitial cystitis   . Lupus (HCC)   . Lupus (systemic lupus erythematosus) (HCC)   .  Migraines   . Pseudotumor cerebri     PAST SURGICAL HISTORY: Past Surgical History:  Procedure Laterality Date  . ABDOMINAL HYSTERECTOMY    . APPENDECTOMY    . CHOLECYSTECTOMY    . CYSTOSCOPY      FAMILY HISTORY: Family History  Problem Relation Age of Onset  . Graves' disease Mother   . Heart disease Father   . Prostate cancer Father   . Healthy Brother   . Healthy Maternal Aunt   . Healthy Maternal Uncle     SOCIAL HISTORY:  Social History   Socioeconomic History  . Marital status: Married    Spouse name: Not on file  . Number of children: Not on file  . Years of education: Not on file  . Highest education level: Not on file  Occupational History  . Not on file  Social Needs  . Financial resource strain: Not on file  . Food insecurity:    Worry: Not on file    Inability: Not on file  . Transportation needs:    Medical: Not on file    Non-medical: Not on file  Tobacco Use  . Smoking status: Never Smoker  . Smokeless tobacco: Never Used  Substance and Sexual Activity  . Alcohol use: No  . Drug use:  No  . Sexual activity: Not on file  Lifestyle  . Physical activity:    Days per week: Not on file    Minutes per session: Not on file  . Stress: Not on file  Relationships  . Social connections:    Talks on phone: Not on file    Gets together: Not on file    Attends religious service: Not on file    Active member of club or organization: Not on file    Attends meetings of clubs or organizations: Not on file    Relationship status: Not on file  . Intimate partner violence:    Fear of current or ex partner: Not on file    Emotionally abused: Not on file    Physically abused: Not on file    Forced sexual activity: Not on file  Other Topics Concern  . Not on file  Social History Narrative  . Not on file     PHYSICAL EXAM  There were no vitals filed for this visit.  There is no height or weight on file to calculate BMI.   General: The patient is an obese woman in no distress.   Fundoscopic exam shows normsal discs and retinal vessels.   Neck: The neck is supple.  She has mild tenderness over the splenius capitis muscles Range of motion is ok in the neck.    Some FMS tender point tenderness to deep palpation  Neurologic Exam  Mental status: The patient is alert and oriented x 3 at the time of the examination. The patient has apparent normal recent and remote memory, with an apparently normal attention span and concentration ability.   Speech is normal.  Cranial nerves: Extraocular movements are full.     Facial strength and sensation  is normal. Palatal elevation tongue protrusion is midline. Trapezius strength is normal.   Motor:  Muscle bulk is normal.   Tone is normal. Strength is  5 / 5 in limbs  Sensory: She has intact sensation to touch in the legs and arms  Gait and station: Station is normal.   Gait is normal.   Tandem gait is ok  Reflexes: Deep tendon reflexes are symmetric  and normal bilaterally.     DIAGNOSTIC DATA (LABS, IMAGING, TESTING) - I reviewed  patient records, labs, notes, testing and imaging myself where available.  Lab Results  Component Value Date   WBC 5.9 02/19/2015   HGB 12.7 02/19/2015   HCT 39.4 02/19/2015   MCV 89.5 02/19/2015   PLT 255 02/19/2015        ASSESSMENT AND PLAN  Chronic migraine without aura without status migrainosus, not intractable  Idiopathic intracranial hypertension  Neck pain  Insomnia, unspecified type  Fibromyalgia   Maxton Noreen A. Epimenio Foot, MD, PhD 09/01/2018, 2:46 PM Certified in Neurology, Clinical Neurophysiology, Sleep Medicine, Pain Medicine and Neuroimaging  [facility] [address] [place], [place] [address] [phone]

## 2018-09-01 NOTE — Telephone Encounter (Signed)
Per the request of Dr. Epimenio Foot I called to speak with the patient regarding her out of pocket cost for Botox. She did not answer so I left a VM asking her to call back.

## 2018-09-04 ENCOUNTER — Ambulatory Visit: Payer: Self-pay | Admitting: Neurology

## 2018-09-30 NOTE — Telephone Encounter (Signed)
I called the patients insurance and spoke with Carmelina Dane. We checked codes 12458 and [phone] for authorization. She stated that NPR was needed ref#[id number]. DW

## 2018-10-01 ENCOUNTER — Other Ambulatory Visit: Payer: Self-pay | Admitting: Neurology

## 2018-10-01 MED ORDER — OXYCODONE HCL 15 MG PO TABS: 15.0000 mg | ORAL_TABLET | Freq: Three times a day (TID) | ORAL | 0 refills | Status: DC | PRN

## 2018-10-01 NOTE — Addendum Note (Signed)
Addended by: Eilene Ghazi L on: 10/01/2018 02:06 PM   Modules accepted: Orders

## 2018-10-01 NOTE — Telephone Encounter (Signed)
Pt has called for a refill on her xyCODONE (ROXICODONE) 15 MG immediate release tablet [facility]

## 2018-10-05 ENCOUNTER — Telehealth: Payer: Self-pay | Admitting: *Deleted

## 2018-10-05 NOTE — Telephone Encounter (Signed)
Called, LVM for pt to call back today about her appt tomorrow

## 2018-10-06 ENCOUNTER — Other Ambulatory Visit: Payer: Self-pay

## 2018-10-06 ENCOUNTER — Ambulatory Visit (INDEPENDENT_AMBULATORY_CARE_PROVIDER_SITE_OTHER): Payer: Medicare Other | Admitting: Neurology

## 2018-10-06 VITALS — BP 139/89 | HR 76 | Temp 98.6°F | Ht 65.0 in | Wt 234.5 lb

## 2018-10-06 DIAGNOSIS — G43709 Chronic migraine without aura, not intractable, without status migrainosus: Secondary | ICD-10-CM | POA: Diagnosis not present

## 2018-10-06 DIAGNOSIS — M542 Cervicalgia: Secondary | ICD-10-CM | POA: Diagnosis not present

## 2018-10-06 DIAGNOSIS — M797 Fibromyalgia: Secondary | ICD-10-CM

## 2018-10-06 MED ORDER — BACLOFEN 10 MG PO TABS: ORAL_TABLET | ORAL | 3 refills | Status: DC

## 2018-10-06 NOTE — Progress Notes (Signed)
[facility]  PATIENT: Connie Arnold DOB: [birth year]     HISTORICAL  CHIEF COMPLAINT:  Chief Complaint  Patient presents with  . Botulinum Toxin Injection    RM 12, alone. 100Ux2 vials. Lot: [id number]. Expiration: 04/2021. NDC: [id number].     HISTORY OF PRESENT ILLNESS:  Connie Arnold is a 53 y.o. woman with chronic migraine headaches and other neurologic issues.    Update 10/06/2018: Migraine headaches are occurring every day (30/30) and occur for more than 4 hours each day.  Her migraines are associated with throbbing pain, nausea and photophobia. Often the headaches are worse on the right Moving the head worsens the pain.  Topamax has reduced the intensity but not the frequency.  In the past she was on Botox and did very well with a reduction in both headache frequency and intensity.   we are able to get her back on Botox.   She also has neck pain.  The neck pain is worse on the right.  Neck pain is doing about the same.    Fibromyalgia pain is about the same and helped by her medications.   Update 03/09/2018: She is still having daily headaches though the intensity is better since starting Aimovig.    HA's are present when she wakes up and improve some days as the day goes on and then it worsens in the evening again.    She gets some nausea but no vomiting.   She has photophobia and phonophobia.   Laying down helps the headaches when they get worse.She is also on Topamax with some benefit.       Fibromyalgia is a little better as she is doing some exercise which may have helped.    She takes oxycodone and tolerates it well.    She has not escalated and is not getting opiates from other doctors. We stopped her benzodiazepines earlier this year to reduce risk..   [place] CS Database has been reviewed.    She is on Benlysta and Plaquenil for her SLE.   Some of her pain could be coming from that as well.    Her depression is doing ok on Cymbalta and she tolerates it  well.  Her insomnia is doing worse with sleep maintenance issues after 4-5 hours of sleep.      Update 10/27/2017: She continues to have migraine headaches on a daily basis.   These occur across her scalp.     HA's improve with sleep and get worse if she is sleep deprived.  Pain is throbbing.   She gets nausea but no vomiting.    She has photo/phonophobia    Also, she gets benefit from the splenius capitus TPI/ONBs performed every 4 months or so.,     In the past, Botox had helped.   Her insurance does not cover it well.   They also did not cover Ajovy.      FMS pain is about the same.    Oxycodone helps pain some and is well tolerated.  [place] database reviewed and she is compliant and not getting controlled substances elsewhere.   No drug seeking behavior.     She also has lupus which may be playing a role  She still has trouble falling asleep.   At the last visit, we simplified the controlled substances by elinminating hte benzodiazepine and continuing oxycodone.   Trazodone may have helped some but is out.  Melatonin helps her fall asleep.  Update 06/27/17: She feels her headaches are doing about the same.   She has more pain in the neck and upper back.   Neck pain radiates into the back of the head.   In the past, TPI's or nerve blocks have helped her pain.    She switched from Topamax to Keppra but feels the Topamax helped more.    She is also on muscle relaxants.   HA's are back to daily (30/30 days > 4 hours a day).    She was diagnosed with SLE and is on steroids and Plaquenil and Benlysta.   She has generalized joint pain.   Her right foot/ankle are especially painful.  The [place] controlled database was reviewed and she is compliant and doe not get controlled substances from other doctors.     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    This improved and she no longer has papilledema.       Update 12/26/2016: HA/neck pain:   She has had a severe headache for the  past 5 days. Current pain is in the neck and head, right > left.     Pain is worse with movements and better with bedrest.    She gets nausea but not vomiting.   She notes photo and phonophobia.    She takes 15 mg po tid oxycodone.    It takes the edge off but does not eliminate the pain.   Topamax 200 mg poi bid, tizanidine and baclofen have only  helped a little bit.      Her headaches have done best on Botox in the past. Unfortunately, due to very high co-pays was unable to go back on in the past.  Chronic Migraine:  She is better the last 30 days with only 8 -10 migraines (usually 28-30/30).      Headaches last no more than 4 hours a day.    When present, pain is bilateral and throbbing.   There is often neck pain.    She has photophobia, phonophobia and nausea with headaches.  No vomiting.   Headaches are worse in the afternoons   Moving intensifies the pain.   The combination of Topamax and Keppra works better than Topamax alone.  Neck Pain:  Neck pain is better on steroids.    There is still some right > left pain.  In the past, occipital nerve blocks have helped the neck pain and often the headaches when they become worse.  Pain med's:    Currently, she is on oxycodone 15 mg by mouth 3 times a day and alprazolam 3 times a day. She is also on baclofen and tizanidine for spasticity.   She is on topiramate 200 mg po bid and Keppra for the headaches.  Mood:   Mood is currently doing well. She also thinks anxiety is doing a little bit better. She is less apathetic. She is on duloxetine.  SLE:   [facility]   She was diagnosed with SLE in 2006 and has been on Plaquenil x years and more recently Benlysta.    Her Complements were normal 11/17 but ANA is positive (speckled)    ------------ Chronic Migraine History:   She has had chronic migraine headache with moderate pain, flaring up to severe,  for most of the past couple of years   She is having chronic migraine 30/30 days every month,  lasting for more than 4 hours every  day.  She has received some benefit from occipital nerve blocks, trigger point injections and medications but the headaches still persist on a daily basis for past couple years.    She had been on Botox for migraine prophylaxis in the past with benefit. However, with changes in insurance she was unable to continue due to very high co-pay. She is currently on Topamax 200 mg by mouth twice a day and muscle relaxants and opiates.  She has occasional emergency room visits and frequent additional visits to my office for trigger point injections..  She has tried and failed multiple prophylactic medications for her chronic migraine: Antiepileptics:    zonisamide, topiramate, Keppra.   (Due to obesity, Depakote is a poor choice) Anti-depressants  amitriptyline, imipramine Anti-hypertensives:  Diamox Muscle relaxants:  baclofen, tizanidine NSAID's:  She is unable to tolerate chronic anti-inflammatories due to mild renal insufficiency.  She has had multiple visits to ER and to our office for injections when pain is more severe.     ALLERGIES: Allergies  Allergen Reactions  . Nsaids Other (See Comments)    Chronic kidney failure  . Codeine   . Ramipril   . Imitrex [Sumatriptan] Nausea And Vomiting    HOME MEDICATIONS:  Current Outpatient Medications:  .  ADVAIR DISKUS 250-50 MCG/DOSE AEPB, Inhale 1 puff into the lungs as needed. , Disp: , Rfl:  .  albuterol (PROVENTIL HFA;VENTOLIN HFA) 108 (90 Base) MCG/ACT inhaler, Inhale into the lungs., Disp: , Rfl:  .  albuterol (PROVENTIL) (2.5 MG/3ML) 0.083% nebulizer solution, , Disp: , Rfl:  .  Azelastine-Fluticasone 137-50 MCG/ACT SUSP, Place 1 spray into the nose as needed. , Disp: , Rfl:  .  baclofen (LIORESAL) 10 MG tablet, Take 1 to 2 tablets by  mouth 3 times daily, Disp: 540 tablet, Rfl: 3 .  Belimumab (BENLYSTA) 200 MG/ML SOAJ, Inject 1 Dose into the skin once a week. , Disp: , Rfl:  .  bupivacaine (MARCAINE  PRESERVATIVE FREE) 0.5 % SOLN injection, Instill 15cc into bladder daily, Disp: , Rfl:  .  cetirizine (ZYRTEC) 10 MG tablet, Take 10 mg by mouth daily. , Disp: , Rfl:  .  DULoxetine (CYMBALTA) 60 MG capsule, Take 1 capsule (60 mg total) by mouth daily., Disp: 90 capsule, Rfl: 4 .  fluconazole (DIFLUCAN) 150 MG tablet, Take 150 mg by mouth once. , Disp: , Rfl:  .  FLUoxetine (PROZAC) 40 MG capsule, Take 40 mg by mouth daily. , Disp: , Rfl:  .  furosemide (LASIX) 40 MG tablet, Take 40 mg by mouth daily. , Disp: , Rfl:  .  glucose blood test strip, , Disp: , Rfl:  .  heparin 16109 UNIT/ML injection, Instill 4 cc into bladder 2x daily, Disp: , Rfl:  .  hydroxychloroquine (PLAQUENIL) 200 MG tablet, Take 1 tablet (200 mg total) by mouth 2 (two) times daily., Disp: 30 tablet, Rfl: 0 .  ipratropium-albuterol (DUONEB) 0.5-2.5 (3) MG/3ML SOLN, 3 mLs as needed. , Disp: , Rfl:  .  lactulose, encephalopathy, (CHRONULAC) 10 GM/15ML SOLN, daily as needed. , Disp: , Rfl:  .  montelukast (SINGULAIR) 10 MG tablet, Take 10 mg by mouth at bedtime. , Disp: , Rfl:  .  oxyCODONE (ROXICODONE) 15 MG immediate release tablet, Take 1 tablet (15 mg total) by mouth every 8 (eight) hours as needed for pain., Disp: 90 tablet, Rfl: 0 .  phentermine 37.5 MG capsule, Take 1 capsule (37.5 mg total) by mouth every morning., Disp: 30 capsule, Rfl: 5 .  promethazine (PHENERGAN) 50 MG/ML injection, Inject into the muscle as needed. , Disp: , Rfl:  .  topiramate (TOPAMAX) 200 MG tablet, Take 1 tablet (200 mg total) by mouth 2 (two) times daily., Disp: 180 tablet, Rfl: 4 .  traZODone (DESYREL) 100 MG tablet, Take 1 tablet (100 mg total) by mouth at bedtime., Disp: 90 tablet, Rfl: 3 .  triamcinolone (NASACORT) 55 MCG/ACT AERO nasal inhaler, Place 2 sprays into the nose., Disp: , Rfl:  .  trimethoprim (TRIMPEX) 100 MG tablet, Take 1 tablet by mouth  daily, Disp: , Rfl:  .  Vitamin D, Ergocalciferol, (DRISDOL) 50000 UNITS CAPS capsule, Take  50,000 Units by mouth every 7 (seven) days. , Disp: , Rfl:   PAST MEDICAL HISTORY: Past Medical History:  Diagnosis Date  . Asthma   . Carpal tunnel syndrome 09/29/2014  . Chronic back pain   . Chronic kidney disease   . Diabetes mellitus without complication (HCC)   . Fatty infiltration of liver 03/17/2015  . Fibromyalgia   . Hypertension   . IBS (irritable bowel syndrome)   . Interstitial cystitis   . Lupus (HCC)   . Lupus (systemic lupus erythematosus) (HCC)   . Migraines   . Pseudotumor cerebri     PAST SURGICAL HISTORY: Past Surgical History:  Procedure Laterality Date  . ABDOMINAL HYSTERECTOMY    . APPENDECTOMY    . CHOLECYSTECTOMY    . CYSTOSCOPY      FAMILY HISTORY: Family History  Problem Relation Age of Onset  . Graves' disease Mother   . Heart disease Father   . Prostate cancer Father   . Healthy Brother   . Healthy Maternal Aunt   . Healthy Maternal Uncle     SOCIAL HISTORY:  Social History   Socioeconomic History  . Marital status: Married    Spouse name: Not on file  . Number of children: Not on file  . Years of education: Not on file  . Highest education level: Not on file  Occupational History  . Not on file  Social Needs  . Financial resource strain: Not on file  . Food insecurity:    Worry: Not on file    Inability: Not on file  . Transportation needs:    Medical: Not on file    Non-medical: Not on file  Tobacco Use  . Smoking status: Never Smoker  . Smokeless tobacco: Never Used  Substance and Sexual Activity  . Alcohol use: No  . Drug use: No  . Sexual activity: Not on file  Lifestyle  . Physical activity:    Days per week: Not on file    Minutes per session: Not on file  . Stress: Not on file  Relationships  . Social connections:    Talks on phone: Not on file    Gets together: Not on file    Attends religious service: Not on file    Active member of club or organization: Not on file    Attends meetings of clubs or  organizations: Not on file    Relationship status: Not on file  . Intimate partner violence:    Fear of current or ex partner: Not on file    Emotionally abused: Not on file    Physically abused: Not on file    Forced sexual activity: Not on file  Other Topics Concern  . Not on file  Social History Narrative  . Not on file     PHYSICAL EXAM  Vitals:  10/06/18 1609  BP: 139/89  Pulse: 76  Temp: 98.6 F (37 C)  Weight: 234 lb 8 oz (106.4 kg)  Height: 5\' 5"  (1.651 m)    Body mass index is 39.02 kg/m.   General: The patient is an obese woman in no distress.     Neck: The neck is tender over the splenius capitis greater than splenius cervicus muscles, right greater than left.  Neurologic Exam  Mental status: The patient is alert and oriented x 3 at the time of the examination. The patient has apparent normal recent and remote memory, with an apparently normal attention span and concentration ability.   Speech is normal.  Cranial nerves: Extraocular movements are full.     Facial strength is normal.  Trapezius strength is normal.    Motor:  Muscle bulk is normal.   Tone is normal. Strength is  5 / 5 in limbs   Gait and station: Station is normal.   Gait is normal.   Tandem gait is ok     DIAGNOSTIC DATA (LABS, IMAGING, TESTING) - I reviewed patient records, labs, notes, testing and imaging myself where available.  Lab Results  Component Value Date   WBC 5.9 02/19/2015   HGB 12.7 02/19/2015   HCT 39.4 02/19/2015   MCV 89.5 02/19/2015   PLT 255 02/19/2015        ASSESSMENT AND PLAN  Chronic migraine without aura without status migrainosus, not intractable  Neck pain  Fibromyalgia   1.    Botox 200 units as follows: 95 units into muscles of the face and head as follows: Frontalis (5 units x 2 on the left, 5 units x 2 on the right), procerus and corrugators (5 units x 3), temporalis (5 units x 4 on the left and 5 units x 4 on the right), occipitalis (5  units x 2 on the left and 5 units x 2 on the right) 75 Units into the muscles of the neck as follows: Splenius capitis (12.5 units x 2), Splenius  cervicus (7.5 units x 2), C6-C7 paraspinal (5 units x 2), trapezius (12.5 units x 2) 30 U wasted 2.  Continue other medications.  Refill baclofen. 3.   Stay active and exercise as tolerated. 4.   return in 3 months or sooner if there are new or worsening neurologic symptoms.  Richard A. Epimenio Foot, MD, PhD 10/06/2018, 5:24 PM Certified in Neurology, Clinical Neurophysiology, Sleep Medicine, Pain Medicine and Neuroimaging  [facility] [address] [place], [place] [address] [phone]

## 2018-10-06 NOTE — Telephone Encounter (Signed)
Called pt back. Updated med list, pharmacy, allergies on file. She denies signs/sx of covid-19 or being exposed to anyone with covid-19. Explained new check in process. Advised her to check in at 330pm.

## 2018-10-06 NOTE — Telephone Encounter (Signed)
Called pt. She was pulling up to building for appt. I called and met her in lobby for appt.

## 2018-10-06 NOTE — Telephone Encounter (Signed)
Called, LVM again on mobile number for pt to call office about her appt today.  Tried home number at [phone]. Sounded like it was a fax machine, got dial tone.   Tried other number: [phone] not in service  Tried Connie Arnold at [phone] (spouse-on DPR). LVM letting him know we have been trying to reach wife and to have her call prior to appt scheduled today for 4pm

## 2018-10-06 NOTE — Telephone Encounter (Signed)
Pt returned call. RN not available. Please call back as soon as available. °

## 2018-11-03 ENCOUNTER — Other Ambulatory Visit: Payer: Self-pay | Admitting: Neurology

## 2018-11-03 MED ORDER — OXYCODONE HCL 15 MG PO TABS: 15.0000 mg | ORAL_TABLET | Freq: Three times a day (TID) | ORAL | 0 refills | Status: DC | PRN

## 2018-11-03 NOTE — Addendum Note (Signed)
Addended by: Hope Pigeon on: 11/03/2018 10:31 AM   Modules accepted: Orders

## 2018-11-03 NOTE — Telephone Encounter (Signed)
Pt has called for refill on oxyCODONE (ROXICODONE) 15 MG immediate release tablet [facility]

## 2018-11-18 ENCOUNTER — Other Ambulatory Visit: Payer: Self-pay | Admitting: Neurology

## 2018-12-01 ENCOUNTER — Other Ambulatory Visit: Payer: Self-pay | Admitting: Neurology

## 2018-12-01 MED ORDER — OXYCODONE HCL 15 MG PO TABS: 15.0000 mg | ORAL_TABLET | Freq: Three times a day (TID) | ORAL | 0 refills | Status: DC | PRN

## 2018-12-01 NOTE — Telephone Encounter (Signed)
Pt is requesting a refill of oxyCODONE (ROXICODONE) 15 MG immediate release tablet, to be sent to [place], [place] - [id number]

## 2018-12-30 ENCOUNTER — Other Ambulatory Visit: Payer: Self-pay | Admitting: Neurology

## 2018-12-30 MED ORDER — OXYCODONE HCL 15 MG PO TABS: 15.0000 mg | ORAL_TABLET | Freq: Three times a day (TID) | ORAL | 0 refills | Status: DC | PRN

## 2018-12-30 NOTE — Addendum Note (Signed)
Addended by: Hope Pigeon on: 12/30/2018 04:05 PM   Modules accepted: Orders

## 2018-12-30 NOTE — Telephone Encounter (Signed)
I called this patient today to confirm her Monday appointment. During conversation patient asked if she could go ahead and receive a refill of Oxycodone. I informed patient I would send a note to Dr. Felecia Shelling regarding this request.

## 2018-12-30 NOTE — Addendum Note (Signed)
Addended by: Britt Bottom on: 12/30/2018 04:47 PM   Modules accepted: Orders

## 2019-01-04 ENCOUNTER — Telehealth: Payer: Self-pay | Admitting: Neurology

## 2019-01-04 ENCOUNTER — Encounter: Payer: Self-pay | Admitting: Neurology

## 2019-01-04 ENCOUNTER — Other Ambulatory Visit: Payer: Self-pay

## 2019-01-04 ENCOUNTER — Ambulatory Visit (INDEPENDENT_AMBULATORY_CARE_PROVIDER_SITE_OTHER): Payer: Medicare Other | Admitting: Neurology

## 2019-01-04 VITALS — BP 141/86 | HR 57 | Temp 96.2°F | Ht 65.0 in | Wt 225.0 lb

## 2019-01-04 DIAGNOSIS — G4700 Insomnia, unspecified: Secondary | ICD-10-CM

## 2019-01-04 DIAGNOSIS — G43709 Chronic migraine without aura, not intractable, without status migrainosus: Secondary | ICD-10-CM | POA: Diagnosis not present

## 2019-01-04 DIAGNOSIS — M797 Fibromyalgia: Secondary | ICD-10-CM

## 2019-01-04 DIAGNOSIS — F329 Major depressive disorder, single episode, unspecified: Secondary | ICD-10-CM

## 2019-01-04 DIAGNOSIS — M542 Cervicalgia: Secondary | ICD-10-CM

## 2019-01-04 DIAGNOSIS — F32A Depression, unspecified: Secondary | ICD-10-CM

## 2019-01-04 MED ORDER — PHENTERMINE HCL 37.5 MG PO CAPS: 37.5000 mg | ORAL_CAPSULE | ORAL | 5 refills | Status: DC

## 2019-01-04 NOTE — Telephone Encounter (Signed)
3 months botox ° °

## 2019-01-04 NOTE — Progress Notes (Signed)
[facility]  PATIENT: Connie Arnold DOB: 01-Feb-1966    HISTORICAL  CHIEF COMPLAINT:  Chief Complaint  Patient presents with  . Follow-up    RM 12, alone. Last seen 09/01/18. Here to f/u on migraines, pain. Taking phentermine for weight loss. Last botox 10/06/18.     HISTORY OF PRESENT ILLNESS:  Connie Arnold is a 53 y.o. woman with chronic migraine headaches and other neurologic issues.    Update 01/04/2019: Her migraines are doing better since starting Botox.   She still has some headaches every day but they are far less intense.  Her neck pain is also better.  She is still on Topiramate.    Her fibromyalgia pain is about the same.   Sh notes more stress and her mylgias usually worsen with stress.      She has done less exercise (used to go to gym a few times a week).   Depression is doing worse.   She and her husband are separated and she has started therapy.      Update 10/06/2018: Migraine headaches are occurring every day (30/30) and occur for more than 4 hours each day.  Her migraines are associated with throbbing pain, nausea and photophobia. Often the headaches are worse on the right Moving the head worsens the pain.  Topamax has reduced the intensity but not the frequency.  In the past she was on Botox and did very well with a reduction in both headache frequency and intensity.   we are able to get her back on Botox.   She also has neck pain.  The neck pain is worse on the right.  Neck pain is doing about the same.    Fibromyalgia pain is about the same and helped by her medications.   Update 03/09/2018: She is still having daily headaches though the intensity is better since starting Aimovig.    HA's are present when she wakes up and improve some days as the day goes on and then it worsens in the evening again.    She gets some nausea but no vomiting.   She has photophobia and phonophobia.   Laying Arnold helps the headaches when they get worse.She is also  on Topamax with some benefit.       Fibromyalgia is a little better as she is doing some exercise which may have helped.    She takes oxycodone and tolerates it well.    She has not escalated and is not getting opiates from other doctors. We stopped her benzodiazepines earlier this year to reduce risk..   [place] CS Database has been reviewed.    She is on Benlysta and Plaquenil for her SLE.   Some of her pain could be coming from that as well.    Her depression is doing ok on Cymbalta and she tolerates it well.  Her insomnia is doing worse with sleep maintenance issues after 4-5 hours of sleep.      Update 10/27/2017: She continues to have migraine headaches on a daily basis.   These occur across her scalp.     HA's improve with sleep and get worse if she is sleep deprived.  Pain is throbbing.   She gets nausea but no vomiting.    She has photo/phonophobia    Also, she gets benefit from the splenius capitus TPI/ONBs performed every 4 months or so.,     In the past, Botox had helped.   Her insurance does not cover it  well.   They also did not cover Ajovy.      FMS pain is about the same.    Oxycodone helps pain some and is well tolerated.  [place] database reviewed and she is compliant and not getting controlled substances elsewhere.   No drug seeking behavior.     She also has lupus which may be playing a role  She still has trouble falling asleep.   At the last visit, we simplified the controlled substances by elinminating hte benzodiazepine and continuing oxycodone.   Trazodone may have helped some but is out.  Melatonin helps her fall asleep.      Update 06/27/17: She feels her headaches are doing about the same.   She has more pain in the neck and upper back.   Neck pain radiates into the back of the head.   In the past, TPI's or nerve blocks have helped her pain.    She switched from Topamax to Keppra but feels the Topamax helped more.    She is also on muscle relaxants.   HA's are back to daily (30/30  days > 4 hours a day).    She was diagnosed with SLE and is on steroids and Plaquenil and Benlysta.   She has generalized joint pain.   Her right foot/ankle are especially painful.  The [place] controlled database was reviewed and she is compliant and doe not get controlled substances from other doctors.     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    This improved and she no longer has papilledema.       Update 12/26/2016: HA/neck pain:   She has had a severe headache for the past 5 days. Current pain is in the neck and head, right > left.     Pain is worse with movements and better with bedrest.    She gets nausea but not vomiting.   She notes photo and phonophobia.    She takes 15 mg po tid oxycodone.    It takes the edge off but does not eliminate the pain.   Topamax 200 mg poi bid, tizanidine and baclofen have only  helped a little bit.      Her headaches have done best on Botox in the past. Unfortunately, due to very high co-pays was unable to go back on in the past.  Chronic Migraine:  She is better the last 30 days with only 8 -10 migraines (usually 28-30/30).      Headaches last no more than 4 hours a day.    When present, pain is bilateral and throbbing.   There is often neck pain.    She has photophobia, phonophobia and nausea with headaches.  No vomiting.   Headaches are worse in the afternoons   Moving intensifies the pain.   The combination of Topamax and Keppra works better than Topamax alone.  Neck Pain:  Neck pain is better on steroids.    There is still some right > left pain.  In the past, occipital nerve blocks have helped the neck pain and often the headaches when they become worse.  Pain med's:    Currently, she is on oxycodone 15 mg by mouth 3 times a day and alprazolam 3 times a day. She is also on baclofen and tizanidine for spasticity.   She is on topiramate 200 mg po bid and Keppra for the headaches.  Mood:   Mood is currently doing well.  She also  thinks anxiety is doing a little bit better. She is less apathetic. She is on duloxetine.  SLE:   [facility]   She was diagnosed with SLE in 2006 and has been on Plaquenil x years and more recently Benlysta.    Her Complements were normal 11/17 but ANA is positive (speckled)    ------------ Chronic Migraine History:   She has had chronic migraine headache with moderate pain, flaring up to severe,  for most of the past couple of years   She is having chronic migraine 30/30 days every month, lasting for more than 4 hours every day.  She has received some benefit from occipital nerve blocks, trigger point injections and medications but the headaches still persist on a daily basis for past couple years.    She had been on Botox for migraine prophylaxis in the past with benefit. However, with changes in insurance she was unable to continue due to very high co-pay. She is currently on Topamax 200 mg by mouth twice a day and muscle relaxants and opiates.  She has occasional emergency room visits and frequent additional visits to my office for trigger point injections..  She has tried and failed multiple prophylactic medications for her chronic migraine: Antiepileptics:    zonisamide, topiramate, Keppra.   (Due to obesity, Depakote is a poor choice) Anti-depressants  amitriptyline, imipramine Anti-hypertensives:  Diamox Muscle relaxants:  baclofen, tizanidine NSAID's:  She is unable to tolerate chronic anti-inflammatories due to mild renal insufficiency.  She has had multiple visits to ER and to our office for injections when pain is more severe.     ALLERGIES: Allergies  Allergen Reactions  . Nsaids Other (See Comments)    Chronic kidney failure  . Codeine   . Ramipril   . Imitrex [Sumatriptan] Nausea And Vomiting    HOME MEDICATIONS:  Current Outpatient Medications:  .  ADVAIR DISKUS 250-50 MCG/DOSE AEPB, Inhale 1 puff into the lungs as needed. , Disp: , Rfl:  .  albuterol  (PROVENTIL HFA;VENTOLIN HFA) 108 (90 Base) MCG/ACT inhaler, Inhale into the lungs., Disp: , Rfl:  .  albuterol (PROVENTIL) (2.5 MG/3ML) 0.083% nebulizer solution, , Disp: , Rfl:  .  Azelastine-Fluticasone 137-50 MCG/ACT SUSP, Place 1 spray into the nose as needed. , Disp: , Rfl:  .  baclofen (LIORESAL) 10 MG tablet, Take 1 to 2 tablets by  mouth 3 times daily, Disp: 540 tablet, Rfl: 3 .  Belimumab (BENLYSTA) 200 MG/ML SOAJ, Inject 1 Dose into the skin once a week. , Disp: , Rfl:  .  bupivacaine (MARCAINE PRESERVATIVE FREE) 0.5 % SOLN injection, Instill 15cc into bladder daily, Disp: , Rfl:  .  cetirizine (ZYRTEC) 10 MG tablet, Take 10 mg by mouth daily. , Disp: , Rfl:  .  DULoxetine (CYMBALTA) 60 MG capsule, Take 1 capsule (60 mg total) by mouth daily., Disp: 90 capsule, Rfl: 4 .  fluconazole (DIFLUCAN) 150 MG tablet, Take 150 mg by mouth once. , Disp: , Rfl:  .  FLUoxetine (PROZAC) 40 MG capsule, Take 40 mg by mouth daily. , Disp: , Rfl:  .  furosemide (LASIX) 40 MG tablet, Take 40 mg by mouth daily. , Disp: , Rfl:  .  glucose blood test strip, , Disp: , Rfl:  .  heparin [id number] UNIT/ML injection, Instill 4 cc into bladder 2x daily, Disp: , Rfl:  .  hydroxychloroquine (PLAQUENIL) 200 MG tablet, Take 1 tablet (200 mg total) by mouth 2 (  two) times daily., Disp: 30 tablet, Rfl: 0 .  ipratropium-albuterol (DUONEB) 0.5-2.5 (3) MG/3ML SOLN, 3 mLs as needed. , Disp: , Rfl:  .  lactulose, encephalopathy, (CHRONULAC) 10 GM/15ML SOLN, daily as needed. , Disp: , Rfl:  .  montelukast (SINGULAIR) 10 MG tablet, Take 10 mg by mouth at bedtime. , Disp: , Rfl:  .  oxyCODONE (ROXICODONE) 15 MG immediate release tablet, Take 1 tablet (15 mg total) by mouth every 8 (eight) hours as needed for pain., Disp: 90 tablet, Rfl: 0 .  phentermine 37.5 MG capsule, Take 1 capsule (37.5 mg total) by mouth every morning., Disp: 30 capsule, Rfl: 5 .  promethazine (PHENERGAN) 50 MG/ML injection, Inject into the muscle as needed. ,  Disp: , Rfl:  .  topiramate (TOPAMAX) 200 MG tablet, TAKE 1 TABLET BY MOUTH TWO  TIMES DAILY, Disp: 180 tablet, Rfl: 4 .  traZODone (DESYREL) 100 MG tablet, Take 1 tablet (100 mg total) by mouth at bedtime., Disp: 90 tablet, Rfl: 3 .  triamcinolone (NASACORT) 55 MCG/ACT AERO nasal inhaler, Place 2 sprays into the nose., Disp: , Rfl:  .  trimethoprim (TRIMPEX) 100 MG tablet, Take 1 tablet by mouth  daily, Disp: , Rfl:  .  Vitamin D, Ergocalciferol, (DRISDOL) 50000 UNITS CAPS capsule, Take 50,000 Units by mouth every 7 (seven) days. , Disp: , Rfl:   PAST MEDICAL HISTORY: Past Medical History:  Diagnosis Date  . Asthma   . Carpal tunnel syndrome 09/29/2014  . Chronic back pain   . Chronic kidney disease   . Diabetes mellitus without complication (HCC)   . Fatty infiltration of liver 03/17/2015  . Fibromyalgia   . Hypertension   . IBS (irritable bowel syndrome)   . Interstitial cystitis   . Lupus (HCC)   . Lupus (systemic lupus erythematosus) (HCC)   . Migraines   . Pseudotumor cerebri     PAST SURGICAL HISTORY: Past Surgical History:  Procedure Laterality Date  . ABDOMINAL HYSTERECTOMY    . APPENDECTOMY    . CHOLECYSTECTOMY    . CYSTOSCOPY      FAMILY HISTORY: Family History  Problem Relation Age of Onset  . Graves' disease Mother   . Heart disease Father   . Prostate cancer Father   . Healthy Brother   . Healthy Maternal Aunt   . Healthy Maternal Uncle     SOCIAL HISTORY:  Social History   Socioeconomic History  . Marital status: Married    Spouse name: Not on file  . Number of children: Not on file  . Years of education: Not on file  . Highest education level: Not on file  Occupational History  . Not on file  Social Needs  . Financial resource strain: Not on file  . Food insecurity    Worry: Not on file    Inability: Not on file  . Transportation needs    Medical: Not on file    Non-medical: Not on file  Tobacco Use  . Smoking status: Never Smoker  .  Smokeless tobacco: Never Used  Substance and Sexual Activity  . Alcohol use: No  . Drug use: No  . Sexual activity: Not on file  Lifestyle  . Physical activity    Days per week: Not on file    Minutes per session: Not on file  . Stress: Not on file  Relationships  . Social Musician on phone: Not on file    Gets together: Not on file  Attends religious service: Not on file    Active member of club or organization: Not on file    Attends meetings of clubs or organizations: Not on file    Relationship status: Not on file  . Intimate partner violence    Fear of current or ex partner: Not on file    Emotionally abused: Not on file    Physically abused: Not on file    Forced sexual activity: Not on file  Other Topics Concern  . Not on file  Social History Narrative  . Not on file     PHYSICAL EXAM  Vitals:   01/04/19 1109  BP: (!) 141/86  Pulse: (!) 57  Temp: (!) 96.2 F (35.7 C)  Weight: 225 lb (102.1 kg)  Height: 5\' 5"  (1.651 m)    Body mass index is 37.44 kg/m.   General: The patient is an obese woman in no distress.     Neck: The neck is tender over the splenius capitis greater than splenius cervicus muscles, right greater than left.  Neurologic Exam  Mental status: The patient is alert and oriented x 3 at the time of the examination. The patient has apparent normal recent and remote memory, with an apparently normal attention span and concentration ability.   Speech is normal.  Cranial nerves: Extraocular movements are full.     Facial strength is normal.  Trapezius strength is normal.    Motor:  Muscle bulk is normal.   Tone is normal. Strength is  5 / 5 in limbs   Gait and station: Station is normal.   Gait is normal.  Tandem gait is normal.     DIAGNOSTIC DATA (LABS, IMAGING, TESTING) - I reviewed patient records, labs, notes, testing and imaging myself where available.  Lab Results  Component Value Date   WBC 5.9 02/19/2015   HGB  12.7 02/19/2015   HCT 39.4 02/19/2015   MCV 89.5 02/19/2015   PLT 255 02/19/2015        ASSESSMENT AND PLAN    1. Chronic migraine without aura without status migrainosus, not intractable   2. Fibromyalgia   3. Neck pain   4. Depression, unspecified depression type   5. Insomnia, unspecified type     1.    Botox 200 units as follows: 95 units into muscles of the face and head as follows: Frontalis (5 units x 2 on the left, 5 units x 2 on the right), procerus and corrugators (5 units x 3), temporalis (5 units x 4 on the left and 5 units x 4 on the right), occipitalis (5 units x 2 on the left and 5 units x 2 on the right) 105 Units into the muscles of the neck as follows: Splenius capitis (12.5 units x 2), Splenius  cervicus (7.5 units x 2), C6-C7 paraspinal (5 units x 2), trapezius (15 units x 2); rhomboid 12.5 U x 2 2.  Continue other medications.  Refill phentermine. 3.   Stay active and exercise as tolerated. 4.   return in 3 months or sooner if there are new or worsening neurologic symptoms.   A. Felecia Shelling, MD, PhD 6/38/4665, 99:35 AM Certified in Neurology, Clinical Neurophysiology, Sleep Medicine, Pain Medicine and Neuroimaging  [facility] [address], [place], [place] [address] [phone]

## 2019-01-05 NOTE — Telephone Encounter (Signed)
I called to schedule the patient but she did not answer so I left a VM asking her to call back. DW  

## 2019-02-02 ENCOUNTER — Other Ambulatory Visit: Payer: Self-pay | Admitting: Neurology

## 2019-02-02 MED ORDER — OXYCODONE HCL 15 MG PO TABS: 15.0000 mg | ORAL_TABLET | Freq: Three times a day (TID) | ORAL | 0 refills | Status: DC | PRN

## 2019-02-02 NOTE — Telephone Encounter (Signed)
Pt called needing a refill oh her oxyCODONE (ROXICODONE) 15 MG immediate release tablet sent to the [facility]

## 2019-02-02 NOTE — Telephone Encounter (Signed)
Checked drug registry. She last refilled 01/02/19 #90. Last seen 01/04/19

## 2019-03-05 ENCOUNTER — Telehealth: Payer: Self-pay | Admitting: Neurology

## 2019-03-05 ENCOUNTER — Other Ambulatory Visit: Payer: Self-pay | Admitting: Neurology

## 2019-03-05 MED ORDER — OXYCODONE HCL 15 MG PO TABS: 15.0000 mg | ORAL_TABLET | Freq: Three times a day (TID) | ORAL | 0 refills | Status: DC | PRN

## 2019-03-05 NOTE — Telephone Encounter (Signed)
Not sure, if she is on standing oxycodone 15 mg tid. Will send Rx to cover weekend. She may now have known, that we do not refill controlled substances on days we are closed or after hours; I will make an exception.

## 2019-03-05 NOTE — Telephone Encounter (Signed)
Pt has called asking for a refill on her oxyCODONE (ROXICODONE) 15 MG immediate release tablet [facility] pt told the office is open only Mon-Thurs and this refill request would be responded to on Monday.  Pt has pleaded the request be sent to the on call Dr to see if this request could be processed.  Pt told the request would be sent with no guarantee of it being done before Monday

## 2019-03-05 NOTE — Telephone Encounter (Signed)
Pt has called for a refill on her oxyCODONE (ROXICODONE) 15 MG immediate release tablet  [facility]  

## 2019-03-05 NOTE — Addendum Note (Signed)
Addended by: Star Age on: 03/05/2019 10:37 AM   Modules accepted: Orders

## 2019-03-08 ENCOUNTER — Telehealth: Payer: Self-pay

## 2019-03-08 MED ORDER — OXYCODONE HCL 15 MG PO TABS: 15.0000 mg | ORAL_TABLET | Freq: Three times a day (TID) | ORAL | 0 refills | Status: DC | PRN

## 2019-03-08 NOTE — Telephone Encounter (Signed)
I went ahead and sent in the #90 days starting 10/19

## 2019-03-08 NOTE — Addendum Note (Signed)
Addended by: Arlice Colt A on: 03/08/2019 01:05 PM   Modules accepted: Orders

## 2019-03-08 NOTE — Telephone Encounter (Signed)
This was already sent to Dr. Felecia Shelling this morning, this is a duplicate request

## 2019-03-08 NOTE — Telephone Encounter (Signed)
Patient is requesting a refill on oxyCODONE (ROXICODONE) 15 MG immediate release tablet

## 2019-03-08 NOTE — Telephone Encounter (Signed)
Pt has called, she was informed this request for the refill has already been sent to Dr Felecia Shelling.  Pt okayed and states she will wait

## 2019-03-08 NOTE — Telephone Encounter (Signed)
Checked drug registry. She last refilled 03/05/19 #9. Dr. Rexene Alberts called in temporary supply to get her through the weekend. She normally receives #90. Do you want to send in rx for #81 for the rest of her script? She was last seen 01/04/19

## 2019-04-05 ENCOUNTER — Other Ambulatory Visit: Payer: Self-pay | Admitting: Neurology

## 2019-04-05 MED ORDER — OXYCODONE HCL 15 MG PO TABS: 15.0000 mg | ORAL_TABLET | Freq: Three times a day (TID) | ORAL | 0 refills | Status: DC | PRN

## 2019-04-05 NOTE — Telephone Encounter (Signed)
[place] drug registry has been verified. Last refill was 03/08/2019 # 90 for a 30 day supply by Dr. Felecia Shelling. Pt has been compliant with f/u's.

## 2019-04-05 NOTE — Telephone Encounter (Signed)
1) Medication(s) Requested (by name): oxyCODONE (ROXICODONE) 15 MG   2) Pharmacy of Choice: [facility],  - [address] 3) Special Requests:

## 2019-04-06 ENCOUNTER — Telehealth: Payer: Self-pay | Admitting: Neurology

## 2019-04-06 NOTE — Telephone Encounter (Signed)
This request has been sent to Dr. Felecia Shelling and is a duplicate request.

## 2019-04-06 NOTE — Telephone Encounter (Signed)
Pt is needing a refill on her oxyCODONE (ROXICODONE) 15 MG immediate release tablet sent to the [facility]

## 2019-04-12 ENCOUNTER — Other Ambulatory Visit: Payer: Self-pay | Admitting: Neurology

## 2019-05-03 ENCOUNTER — Other Ambulatory Visit: Payer: Self-pay | Admitting: Neurology

## 2019-05-03 MED ORDER — OXYCODONE HCL 15 MG PO TABS: 15.0000 mg | ORAL_TABLET | Freq: Three times a day (TID) | ORAL | 0 refills | Status: DC | PRN

## 2019-05-03 NOTE — Telephone Encounter (Signed)
Pt is requesting a refill of oxyCODONE (ROXICODONE) 15 MG immediate release tablet , to be sent to [facility], [place] - [address]

## 2019-05-17 ENCOUNTER — Encounter: Payer: Self-pay | Admitting: Family Medicine

## 2019-05-17 ENCOUNTER — Ambulatory Visit: Payer: Medicare Other | Admitting: Family Medicine

## 2019-05-17 DIAGNOSIS — G43709 Chronic migraine without aura, not intractable, without status migrainosus: Secondary | ICD-10-CM | POA: Diagnosis not present

## 2019-05-17 NOTE — Progress Notes (Signed)
Consent Form Botulism Toxin Injection For Chronic Migraine    Reviewed orally with patient, additionally signature is on file:  Botulism toxin has been approved by the Federal drug administration for treatment of chronic migraine. Botulism toxin does not cure chronic migraine and it may not be effective in some patients.  The administration of botulism toxin is accomplished by injecting a small amount of toxin into the muscles of the neck and head. Dosage must be titrated for each individual. Any benefits resulting from botulism toxin tend to wear off after 3 months with a repeat injection required if benefit is to be maintained. Injections are usually done every 3-4 months with maximum effect peak achieved by about 2 or 3 weeks. Botulism toxin is expensive and you should be sure of what costs you will incur resulting from the injection.  The side effects of botulism toxin use for chronic migraine may include:   -Transient, and usually mild, facial weakness with facial injections  -Transient, and usually mild, head or neck weakness with head/neck injections  -Reduction or loss of forehead facial animation due to forehead muscle weakness  -Eyelid drooping  -Dry eye  -Pain at the site of injection or bruising at the site of injection  -Double vision  -Potential unknown long term risks   Contraindications: You should not have Botox if you are pregnant, nursing, allergic to albumin, have an infection, skin condition, or muscle weakness at the site of the injection, or have myasthenia gravis, Lambert-Eaton syndrome, or ALS.  It is also possible that as with any injection, there may be an allergic reaction or no effect from the medication. Reduced effectiveness after repeated injections is sometimes seen and rarely infection at the injection site may occur. All care will be taken to prevent these side effects. If therapy is given over a long time, atrophy and wasting in the muscle injected may  occur. Occasionally the patient's become refractory to treatment because they develop antibodies to the toxin. In this event, therapy needs to be modified.  I have read the above information and consent to the administration of botulism toxin.    BOTOX PROCEDURE NOTE FOR MIGRAINE HEADACHE  Contraindications and precautions discussed with patient(above). Aseptic procedure was observed and patient tolerated procedure. Procedure performed by Debbora Presto, FNP-C.   The condition has existed for more than 6 months, and pt does not have a diagnosis of ALS, Myasthenia Gravis or Lambert-Eaton Syndrome.  Risks and benefits of injections discussed and pt agrees to proceed with the procedure.  Written consent obtained  These injections are medically necessary. Pt  receives good benefits from these injections. These injections do not cause sedations or hallucinations which the oral therapies may cause.   Description of procedure:  The patient was placed in a sitting position. The standard protocol was used for Botox as follows, with 5 units of Botox injected at each site:  -Procerus muscle, midline injection  -Corrugator muscle, bilateral injection  -Frontalis muscle, bilateral injection, with 2 sites each side, medial injection was performed in the upper one third of the frontalis muscle, in the region vertical from the medial inferior edge of the superior orbital rim. The lateral injection was again in the upper one third of the forehead vertically above the lateral limbus of the cornea, 1.5 cm lateral to the medial injection site.  -Temporalis muscle injection, 4 sites, bilaterally. The first injection was 3 cm above the tragus of the ear, second injection site was 1.5 cm to  3 cm up from the first injection site in line with the tragus of the ear. The third injection site was 1.5-3 cm forward between the first 2 injection sites. The fourth injection site was 1.5 cm posterior to the second injection  site. 5th site laterally in the temporalis  muscleat the level of the outer canthus.  -Occipitalis muscle injection, 3 sites, bilaterally. The first injection was done one half way between the occipital protuberance and the tip of the mastoid process behind the ear. The second injection site was done lateral and superior to the first, 1 fingerbreadth from the first injection. The third injection site was 1 fingerbreadth superiorly and medially from the first injection site.  -Cervical paraspinal muscle injection, 2 sites, bilateral knee first injection site was 1 cm from the midline of the cervical spine, 3 cm inferior to the lower border of the occipital protuberance. The second injection site was 1.5 cm superiorly and laterally to the first injection site.  -Trapezius muscle injection was performed at 3 sites, bilaterally. The first injection site was in the upper trapezius muscle halfway between the inflection point of the neck, and the acromion. The second injection site was one half way between the acromion and the first injection site. The third injection was done between the first injection site and the inflection point of the neck.   Will return for repeat injection in 3 months.   200 units used, 155 units of Botox was injected, any Botox not injected was wasted. The patient tolerated the procedure well, there were no complications of the above procedure.

## 2019-05-17 NOTE — Progress Notes (Signed)
B/B Botox 200 Units/Vial  NDC:0023-3921-02 LOT: [id number] Exp" 01/2022  Saline  Lot: [id number] Exp 08/19/2019  PT signed consent. Demonstrated understanding had no questions. States she has not had any aspirin nor blood thinners.   Did take aspirin/blood thinner 6:00 yesterday on 05/16/19.

## 2019-05-17 NOTE — Progress Notes (Signed)
I have read the note, and I agree with the clinical assessment and plan.  Laterica Matarazzo A. Yuri Flener, MD, PhD, FAAN Certified in Neurology, Clinical Neurophysiology, Sleep Medicine, Pain Medicine and Neuroimaging  [facility] [address], [address] [place], [place] [address] [phone]  

## 2019-05-23 ENCOUNTER — Other Ambulatory Visit: Payer: Self-pay | Admitting: Neurology

## 2019-05-31 ENCOUNTER — Other Ambulatory Visit: Payer: Self-pay

## 2019-05-31 DIAGNOSIS — E1122 Type 2 diabetes mellitus with diabetic chronic kidney disease: Secondary | ICD-10-CM | POA: Insufficient documentation

## 2019-05-31 DIAGNOSIS — R5383 Other fatigue: Secondary | ICD-10-CM | POA: Insufficient documentation

## 2019-05-31 DIAGNOSIS — J45909 Unspecified asthma, uncomplicated: Secondary | ICD-10-CM | POA: Diagnosis not present

## 2019-05-31 DIAGNOSIS — Z79899 Other long term (current) drug therapy: Secondary | ICD-10-CM | POA: Insufficient documentation

## 2019-05-31 DIAGNOSIS — I2510 Atherosclerotic heart disease of native coronary artery without angina pectoris: Secondary | ICD-10-CM | POA: Diagnosis not present

## 2019-05-31 DIAGNOSIS — Z20822 Contact with and (suspected) exposure to covid-19: Secondary | ICD-10-CM | POA: Insufficient documentation

## 2019-05-31 DIAGNOSIS — N189 Chronic kidney disease, unspecified: Secondary | ICD-10-CM | POA: Diagnosis not present

## 2019-05-31 DIAGNOSIS — M7918 Myalgia, other site: Secondary | ICD-10-CM | POA: Insufficient documentation

## 2019-05-31 DIAGNOSIS — I129 Hypertensive chronic kidney disease with stage 1 through stage 4 chronic kidney disease, or unspecified chronic kidney disease: Secondary | ICD-10-CM | POA: Insufficient documentation

## 2019-05-31 DIAGNOSIS — Z8673 Personal history of transient ischemic attack (TIA), and cerebral infarction without residual deficits: Secondary | ICD-10-CM | POA: Insufficient documentation

## 2019-06-01 ENCOUNTER — Emergency Department (HOSPITAL_BASED_OUTPATIENT_CLINIC_OR_DEPARTMENT_OTHER)
Admission: EM | Admit: 2019-06-01 | Discharge: 2019-06-01 | Disposition: A | Discharge status: Home / Self Care | Payer: Medicare Other | Attending: Emergency Medicine | Admitting: Emergency Medicine

## 2019-06-01 ENCOUNTER — Emergency Department (HOSPITAL_BASED_OUTPATIENT_CLINIC_OR_DEPARTMENT_OTHER): Payer: Medicare Other

## 2019-06-01 ENCOUNTER — Other Ambulatory Visit: Payer: Self-pay

## 2019-06-01 ENCOUNTER — Encounter (HOSPITAL_BASED_OUTPATIENT_CLINIC_OR_DEPARTMENT_OTHER): Payer: Self-pay | Admitting: Emergency Medicine

## 2019-06-01 DIAGNOSIS — Z20822 Contact with and (suspected) exposure to covid-19: Secondary | ICD-10-CM

## 2019-06-01 LAB — SARS CORONAVIRUS 2 (TAT 6-24 HRS): SARS Coronavirus 2: NEGATIVE

## 2019-06-01 MED ORDER — ACETAMINOPHEN 500 MG PO TABS
1000.0000 mg | ORAL_TABLET | Freq: Once | ORAL | Status: AC
  Administered 2019-06-01: 1000 mg via ORAL
  Filled 2019-06-01: qty 2

## 2019-06-01 NOTE — Discharge Instructions (Addendum)
Person Under Monitoring Name: Connie Arnold  Location: [address] [place] [address]   Infection Prevention Recommendations for Individuals Confirmed to have, or Being Evaluated for, 2019 Novel Coronavirus (COVID-19) Infection Who Receive Care at Home  Individuals who are confirmed to have, or are being evaluated for, COVID-19 should follow the prevention steps below until a healthcare provider or local or state health department says they can return to normal activities.  Stay home except to get medical care You should restrict activities outside your home, except for getting medical care. Do not go to work, school, or public areas, and do not use public transportation or taxis.  Call ahead before visiting your doctor Before your medical appointment, call the healthcare provider and tell them that you have, or are being evaluated for, COVID-19 infection. This will help the healthcare provider's office take steps to keep other people from getting infected. Ask your healthcare provider to call the local or state health department.  Monitor your symptoms Seek prompt medical attention if your illness is worsening (e.g., difficulty breathing). Before going to your medical appointment, call the healthcare provider and tell them that you have, or are being evaluated for, COVID-19 infection. Ask your healthcare provider to call the local or state health department.  Wear a facemask You should wear a facemask that covers your nose and mouth when you are in the same room with other people and when you visit a healthcare provider. People who live with or visit you should also wear a facemask while they are in the same room with you.  Separate yourself from other people in your home As much as possible, you should stay in a different room from other people in your home. Also, you should use a separate bathroom, if available.  Avoid sharing household items You should not  share dishes, drinking glasses, cups, eating utensils, towels, bedding, or other items with other people in your home. After using these items, you should wash them thoroughly with soap and water.  Cover your coughs and sneezes Cover your mouth and nose with a tissue when you cough or sneeze, or you can cough or sneeze into your sleeve. Throw used tissues in a lined trash can, and immediately wash your hands with soap and water for at least 20 seconds or use an alcohol-based hand rub.  Wash your [employer] your hands often and thoroughly with soap and water for at least 20 seconds. You can use an alcohol-based hand sanitizer if soap and water are not available and if your hands are not visibly dirty. Avoid touching your eyes, nose, and mouth with unwashed hands.   Prevention Steps for Caregivers and Household Members of Individuals Confirmed to have, or Being Evaluated for, COVID-19 Infection Being Cared for in the Home  If you live with, or provide care at home for, a person confirmed to have, or being evaluated for, COVID-19 infection please follow these guidelines to prevent infection:  Follow healthcare provider's instructions Make sure that you understand and can help the patient follow any healthcare provider instructions for all care.  Provide for the patient's basic needs You should help the patient with basic needs in the home and provide support for getting groceries, prescriptions, and other personal needs.  Monitor the patient's symptoms If they are getting sicker, call his or her medical provider and tell them that the patient has, or is being evaluated for, COVID-19 infection. This will help the healthcare provider's  office take steps to keep other people from getting infected. Ask the healthcare provider to call the local or state health department.  Limit the number of people who have contact with the patient If possible, have only one caregiver for the  patient. Other household members should stay in another home or place of residence. If this is not possible, they should stay in another room, or be separated from the patient as much as possible. Use a separate bathroom, if available. Restrict visitors who do not have an essential need to be in the home.  Keep older adults, very young children, and other sick people away from the patient Keep older adults, very young children, and those who have compromised immune systems or chronic health conditions away from the patient. This includes people with chronic heart, lung, or kidney conditions, diabetes, and cancer.  Ensure good ventilation Make sure that shared spaces in the home have good air flow, such as from an air conditioner or an opened window, weather permitting.  Wash your hands often Wash your hands often and thoroughly with soap and water for at least 20 seconds. You can use an alcohol based hand sanitizer if soap and water are not available and if your hands are not visibly dirty. Avoid touching your eyes, nose, and mouth with unwashed hands. Use disposable paper towels to dry your hands. If not available, use dedicated cloth towels and replace them when they become wet.  Wear a facemask and gloves Wear a disposable facemask at all times in the room and gloves when you touch or have contact with the patient's blood, body fluids, and/or secretions or excretions, such as sweat, saliva, sputum, nasal mucus, vomit, urine, or feces.  Ensure the mask fits over your nose and mouth tightly, and do not touch it during use. Throw out disposable facemasks and gloves after using them. Do not reuse. Wash your hands immediately after removing your facemask and gloves. If your personal clothing becomes contaminated, carefully remove clothing and launder. Wash your hands after handling contaminated clothing. Place all used disposable facemasks, gloves, and other waste in a lined container before  disposing them with other household waste. Remove gloves and wash your hands immediately after handling these items.  Do not share dishes, glasses, or other household items with the patient Avoid sharing household items. You should not share dishes, drinking glasses, cups, eating utensils, towels, bedding, or other items with a patient who is confirmed to have, or being evaluated for, COVID-19 infection. After the person uses these items, you should wash them thoroughly with soap and water.  Wash laundry thoroughly Immediately remove and wash clothes or bedding that have blood, body fluids, and/or secretions or excretions, such as sweat, saliva, sputum, nasal mucus, vomit, urine, or feces, on them. Wear gloves when handling laundry from the patient. Read and follow directions on labels of laundry or clothing items and detergent. In general, wash and dry with the warmest temperatures recommended on the label.  Clean all areas the individual has used often Clean all touchable surfaces, such as counters, tabletops, doorknobs, bathroom fixtures, toilets, phones, keyboards, tablets, and bedside tables, every day. Also, clean any surfaces that may have blood, body fluids, and/or secretions or excretions on them. Wear gloves when cleaning surfaces the patient has come in contact with. Use a diluted bleach solution (e.g., dilute bleach with 1 part bleach and 10 parts water) or a household disinfectant with a label that says EPA-registered for coronaviruses. To make a  bleach solution at home, add 1 tablespoon of bleach to 1 quart (4 cups) of water. For a larger supply, add  cup of bleach to 1 gallon (16 cups) of water. Read labels of cleaning products and follow recommendations provided on product labels. Labels contain instructions for safe and effective use of the cleaning product including precautions you should take when applying the product, such as wearing gloves or eye protection and making sure you  have good ventilation during use of the product. Remove gloves and wash hands immediately after cleaning.  Monitor yourself for signs and symptoms of illness Caregivers and household members are considered close contacts, should monitor their health, and will be asked to limit movement outside of the home to the extent possible. Follow the monitoring steps for close contacts listed on the symptom monitoring form.   ? If you have additional questions, contact your local health department or call the epidemiologist on call at [phone] (available 24/7). ? This guidance is subject to change. For the most up-to-date guidance from [facility], please refer to their website: [email or web address]

## 2019-06-01 NOTE — ED Triage Notes (Signed)
Patient presents with complaints of lower back pain, headache and generalized bodyaches x 3 days; denies fever or chills and sweats; states generalized fatigue. States took Nyquill yesterday and daytime syrup today. NAD noted.

## 2019-06-01 NOTE — ED Provider Notes (Addendum)
[facility] EMERGENCY DEPARTMENT Provider Note   CSN: [id number] Arrival date & time: 05/31/19  2356     History Chief Complaint  Patient presents with  . Generalized Body Aches    Connie Arnold is a 54 y.o. female.  The history is provided by the patient.  Muscle Pain This is a new problem. The current episode started more than 2 days ago. The problem occurs constantly. The problem has not changed since onset.Pertinent negatives include no chest pain, no abdominal pain, no headaches and no shortness of breath. Nothing aggravates the symptoms. Nothing relieves the symptoms. She has tried nothing for the symptoms. The treatment provided no relief.  Patient who's husband is "sick at home but refuses to get tested for covid" presents with 3 days of myalgias and fatigue.  No dysuria, no frequency no hesitancy.  No loss of taste or smell, no fevers, no diarrhea. No HA, no weakness no numbness.       Past Medical History:  Diagnosis Date  . Asthma   . Carpal tunnel syndrome 09/29/2014  . Chronic back pain   . Chronic kidney disease   . Diabetes mellitus without complication (HCC)   . Fatty infiltration of liver 03/17/2015  . Fibromyalgia   . Hypertension   . IBS (irritable bowel syndrome)   . Interstitial cystitis   . Lupus (HCC)   . Lupus (systemic lupus erythematosus) (HCC)   . Migraines   . Pseudotumor cerebri     Patient Active Problem List   Diagnosis Date Noted  . TIA (transient ischemic attack) 04/15/2016  . Chronic, continuous use of opioids 01/10/2016  . Chronic constipation 06/27/2015  . Headache, migraine 06/27/2015  . Diabetes mellitus without complication (HCC) 06/27/2015  . Benign essential HTN 06/27/2015  . Encounter for immunization 06/27/2015  . Asthma, moderate persistent 06/27/2015  . Avitaminosis D 06/27/2015  . Abnormal finding in urine 06/13/2015  . Medication overuse headache 03/29/2015  . Benign intracranial hypertension 03/29/2015    . Fatty infiltration of liver 03/17/2015  . Insomnia 11/10/2014  . Depression 11/10/2014  . Migraine without aura, intractable 10/24/2014  . Tingling 09/29/2014  . Carpal tunnel syndrome 09/29/2014  . Cervical disc disorder with radiculopathy of cervical region 08/25/2014  . Bursitis, subacromial 08/25/2014  . Idiopathic intracranial hypertension 08/10/2014  . Chronic migraine without aura without status migrainosus, not intractable 08/10/2014  . Neck pain 08/10/2014  . Chronic migraine w/o aura w/o status migrainosus, not intractable 06/01/2014  . Depression, major, severe recurrence (HCC) 11/01/2013  . Severe episode of recurrent major depressive disorder (HCC) 11/01/2013  . Blood in the urine 04/29/2013  . Abnormal presence of protein in urine 04/29/2013  . Chronic interstitial cystitis 11/16/2011  . Infection of urinary tract 11/16/2011  . Fibromyalgia 05/06/2011  . Other long term (current) drug therapy 05/06/2011  . Systemic lupus erythematosus (HCC) 05/06/2011  . MORBID OBESITY 10/22/2007  . SHORTNESS OF BREATH (SOB) 10/22/2007  . CORONARY HEART DISEASE 10/21/2007  . ALLERGIC RHINITIS 10/21/2007    Past Surgical History:  Procedure Laterality Date  . ABDOMINAL HYSTERECTOMY    . APPENDECTOMY    . CHOLECYSTECTOMY    . CYSTOSCOPY       OB History   No obstetric history on file.     Family History  Problem Relation Age of Onset  . Graves' disease Mother   . Heart disease Father   . Prostate cancer Father   . Healthy Brother   . Healthy Maternal  Aunt   . Healthy Maternal Uncle     Social History   Tobacco Use  . Smoking status: Never Smoker  . Smokeless tobacco: Never Used  Substance Use Topics  . Alcohol use: No  . Drug use: No    Home Medications Prior to Admission medications   Medication Sig Start Date End Date Taking? Authorizing Provider  ADVAIR DISKUS 250-50 MCG/DOSE AEPB Inhale 1 puff into the lungs as needed.  04/16/15   [provider]  albuterol (PROVENTIL HFA;VENTOLIN HFA) 108 (90 Base) MCG/ACT inhaler Inhale into the lungs. 10/21/16   [provider]  albuterol (PROVENTIL) (2.5 MG/3ML) 0.083% nebulizer solution  07/27/14   [provider]  Azelastine-Fluticasone 137-50 MCG/ACT SUSP Place 1 spray into the nose as needed.     [provider]  baclofen (LIORESAL) 10 MG tablet Take 1 to 2 tablets by  mouth 3 times daily 10/06/18   Sater, Pearletha Furl, MD  Belimumab (BENLYSTA) 200 MG/ML SOAJ Inject 1 Dose into the skin once a week.     [provider]  bupivacaine (MARCAINE PRESERVATIVE FREE) 0.5 % SOLN injection Instill 15cc into bladder daily 02/21/15   [provider]  cetirizine (ZYRTEC) 10 MG tablet Take 10 mg by mouth daily.     [provider]  DULoxetine (CYMBALTA) 60 MG capsule Take 1 capsule (60 mg total) by mouth daily. 03/09/18   Sater, Pearletha Furl, MD  fluconazole (DIFLUCAN) 150 MG tablet Take 150 mg by mouth once.     [provider]  FLUoxetine (PROZAC) 40 MG capsule Take 40 mg by mouth daily.  11/01/16   [provider]  furosemide (LASIX) 40 MG tablet Take 40 mg by mouth daily.  08/05/14   [provider]  glucose blood test strip  11/24/14   [provider]  heparin 90300 UNIT/ML injection Instill 4 cc into bladder 2x daily 11/29/14   [provider]  hydroxychloroquine (PLAQUENIL) 200 MG tablet Take 1 tablet (200 mg total) by mouth 2 (two) times daily. 01/05/14   Teressa Lower, NP  ipratropium-albuterol (DUONEB) 0.5-2.5 (3) MG/3ML SOLN 3 mLs as needed.     [provider]  lactulose, encephalopathy, (CHRONULAC) 10 GM/15ML SOLN daily as needed.  08/26/14   [provider]  montelukast (SINGULAIR) 10 MG tablet Take 10 mg by mouth at bedtime.  11/01/16   [provider]  oxyCODONE (ROXICODONE) 15 MG immediate release tablet Take 1 tablet (15 mg total) by mouth every 8 (eight) hours as needed  for pain. 05/03/19   Sater, Pearletha Furl, MD  phentermine 37.5 MG capsule Take 1 capsule (37.5 mg total) by mouth every morning. 01/04/19   Sater, Pearletha Furl, MD  promethazine (PHENERGAN) 50 MG/ML injection Inject into the muscle as needed.  03/03/09   [provider]  topiramate (TOPAMAX) 200 MG tablet TAKE 1 TABLET BY MOUTH TWO  TIMES DAILY 11/18/18   Sater, Pearletha Furl, MD  traZODone (DESYREL) 100 MG tablet TAKE 1 TABLET BY MOUTH AT  BEDTIME 05/24/19   Sater, Pearletha Furl, MD  triamcinolone (NASACORT) 55 MCG/ACT AERO nasal inhaler Place 2 sprays into the nose.    [provider]  trimethoprim (TRIMPEX) 100 MG tablet Take 1 tablet by mouth  daily 10/27/14   [provider]  Vitamin D, Ergocalciferol, (DRISDOL) 50000 UNITS CAPS capsule Take 50,000 Units by mouth every 7 (seven) days.     [provider]    Allergies  Nsaids, Codeine, Ramipril, and Imitrex [sumatriptan]  Review of Systems   Review of Systems  Constitutional: Positive for fatigue. Negative for fever.  HENT: Negative for congestion.   Eyes: Negative for visual disturbance.  Respiratory: Negative for cough and shortness of breath.   Cardiovascular: Negative for chest pain, palpitations and leg swelling.  Gastrointestinal: Negative for abdominal pain and diarrhea.  Genitourinary: Negative for dysuria, flank pain and frequency.  Musculoskeletal: Positive for myalgias. Negative for neck pain and neck stiffness.  Skin: Negative for rash.  Neurological: Negative for weakness, numbness and headaches.  Psychiatric/Behavioral: Negative for agitation and confusion.  All other systems reviewed and are negative.   Physical Exam Updated Vital Signs BP 126/90 (BP Location: Left Arm)   Pulse 64   Temp 98.7 F (37.1 C) (Oral)   Resp 16   Ht 5\' 5"  (1.651 m)   Wt 102 kg   SpO2 100%   BMI 37.42 kg/m   Physical Exam Vitals and nursing note reviewed.  Constitutional:      General: She is not in acute  distress.    Appearance: Normal appearance.  HENT:     Head: Normocephalic and atraumatic.     Nose: Nose normal.  Eyes:     Extraocular Movements: Extraocular movements intact.     Conjunctiva/sclera: Conjunctivae normal.     Pupils: Pupils are equal, round, and reactive to light.  Cardiovascular:     Rate and Rhythm: Normal rate and regular rhythm.     Pulses: Normal pulses.     Heart sounds: Normal heart sounds.  Pulmonary:     Effort: Pulmonary effort is normal.     Breath sounds: Normal breath sounds.  Abdominal:     General: Abdomen is flat. Bowel sounds are normal.     Tenderness: There is no abdominal tenderness. There is no guarding or rebound.  Musculoskeletal:        General: Normal range of motion.     Cervical back: Normal range of motion and neck supple.  Skin:    General: Skin is warm and dry.     Capillary Refill: Capillary refill takes less than 2 seconds.  Neurological:     General: No focal deficit present.     Mental Status: She is alert and oriented to person, place, and time.     Deep Tendon Reflexes: Reflexes normal.  Psychiatric:        Mood and Affect: Mood normal.        Behavior: Behavior normal.     ED Results / Procedures / Treatments   Labs (all labs ordered are listed, but only abnormal results are displayed) Labs Reviewed  SARS CORONAVIRUS 2 (TAT 6-24 HRS)    EKG None  Radiology DG Chest Portable 1 View  Result Date: 06/01/2019 CLINICAL DATA:  Lower back pain and headache EXAM: PORTABLE CHEST 1 VIEW COMPARISON:  April 01, 2019 FINDINGS: The heart size and mediastinal contours are within normal limits. Aortic knob calcifications. Both lungs are clear. The visualized skeletal structures are unremarkable. IMPRESSION: No active disease. Electronically Signed   By: April 03, 2019 M.D.   On: 06/01/2019 00:51    Procedures Procedures (including critical care time)  Medications Ordered in ED Medications  acetaminophen (TYLENOL) tablet  1,000 mg (1,000 mg Oral Given 06/01/19 0153)    ED Course  I have reviewed the triage vital signs and the nursing notes.  Pertinent labs & imaging results that were available during my care of the patient  were reviewed by me and considered in my medical decision making (see chart for details).   [place] reviewed.  Patient is on home narcotics.     Patient is resting comfortably in the room.  She was given tylenol with improvement.  Given that the husband is home "sick" we will send a covid swab and have placed the patient in home quarantine.  This certainly could also be fibromyalgia but covid is the most concerning.  Strict home isolation instructions given verbally and in written form.   Connie Arnold was evaluated in Emergency Department on 06/01/2019 for the symptoms described in the history of present illness. She was evaluated in the context of the global COVID-19 pandemic, which necessitated consideration that the patient might be at risk for infection with the SARS-CoV-2 virus that causes COVID-19. Institutional protocols and algorithms that pertain to the evaluation of patients at risk for COVID-19 are in a state of rapid change based on information released by regulatory bodies including the CDC and federal and state organizations. These policies and algorithms were followed during the patient's care in the ED.  Final Clinical Impression(s) / ED Diagnoses Final diagnoses:  Person under investigation for COVID-19   Return for intractable cough, coughing up blood,fevers >100.4 unrelieved by medication, shortness of breath, intractable vomiting, chest pain, shortness of breath, weakness,numbness, changes in speech, facial asymmetry,abdominal pain, passing out,Inability to tolerate liquids or food, cough, altered mental status or any concerns. No signs of systemic illness or infection. The patient is nontoxic-appearing on exam and vital signs are within normal limits.   I  have reviewed the triage vital signs and the nursing notes. Pertinent labs &imaging results that were available during my care of the patient were reviewed by me and considered in my medical decision making (see chart for details).  After history, exam, and medical workup I feel the patient has been appropriately medically screened and is safe for discharge home. Pertinent diagnoses were discussed with the patient. Patient was given return   Yaviel Kloster, MD 06/01/19 0424    Randal Buba, Yuniel Blaney, MD 06/01/19 0425

## 2019-06-03 ENCOUNTER — Other Ambulatory Visit: Payer: Self-pay | Admitting: Neurology

## 2019-06-03 MED ORDER — OXYCODONE HCL 15 MG PO TABS: 15.0000 mg | ORAL_TABLET | Freq: Three times a day (TID) | ORAL | 0 refills | Status: DC | PRN

## 2019-06-03 NOTE — Telephone Encounter (Signed)
Pt has scheduled her 3 month Botox and has requested a refill on her oxyCODONE (ROXICODONE) 15 MG immediate release tablet  [facility]

## 2019-06-03 NOTE — Addendum Note (Signed)
Addended by: Hillis Range on: 06/03/2019 01:19 PM   Modules accepted: Orders

## 2019-07-05 ENCOUNTER — Other Ambulatory Visit: Payer: Self-pay | Admitting: Family Medicine

## 2019-07-05 MED ORDER — OXYCODONE HCL 15 MG PO TABS: 15.0000 mg | ORAL_TABLET | Freq: Three times a day (TID) | ORAL | 0 refills | Status: DC | PRN

## 2019-07-05 NOTE — Telephone Encounter (Signed)
[place] Database Verified LR:06/04/19 Qty: 90 Pending appointment: Procedure visit; Botox w/ Amy Lomax NP. 08/18/19   Previous appts  -Procedure visit; Shawnie Dapper, NP  05/17/19 -Office visit; Dr. Despina Arias MD, 01/04/19

## 2019-07-05 NOTE — Telephone Encounter (Signed)
1) Medication(s) Requested (by name): oxyCODONE (ROXICODONE) 15 MG immediate release tablet   2) Pharmacy of Choice: [facility] - [place], [place] - [address]  [address], [place] [address]

## 2019-07-22 DIAGNOSIS — M1711 Unilateral primary osteoarthritis, right knee: Secondary | ICD-10-CM | POA: Insufficient documentation

## 2019-07-22 DIAGNOSIS — Z79899 Other long term (current) drug therapy: Secondary | ICD-10-CM | POA: Insufficient documentation

## 2019-07-29 ENCOUNTER — Other Ambulatory Visit: Payer: Self-pay | Admitting: Neurology

## 2019-07-29 MED ORDER — OXYCODONE HCL 15 MG PO TABS: 15.0000 mg | ORAL_TABLET | Freq: Three times a day (TID) | ORAL | 0 refills | Status: DC | PRN

## 2019-07-29 NOTE — Telephone Encounter (Signed)
Pt has called for a refill on her oxyCODONE (ROXICODONE) 15 MG immediate release tablet  [facility]

## 2019-07-29 NOTE — Addendum Note (Signed)
Addended by: Arther Abbott on: 07/29/2019 08:41 AM   Modules accepted: Orders

## 2019-08-11 ENCOUNTER — Telehealth: Payer: Self-pay | Admitting: *Deleted

## 2019-08-11 NOTE — Telephone Encounter (Signed)
I called patients insurance [phone] and spoke to [place].  She states that [id number] and [id number] are billable, do not require PA and are [place] and [place].  Ref# for call is [phone]

## 2019-08-18 ENCOUNTER — Ambulatory Visit: Payer: Medicare Other | Admitting: Family Medicine

## 2019-08-26 ENCOUNTER — Telehealth: Payer: Self-pay | Admitting: Neurology

## 2019-08-26 NOTE — Telephone Encounter (Signed)
Pt called wanting to r/s her Botox appt. Please advise.

## 2019-08-31 ENCOUNTER — Telehealth: Payer: Self-pay | Admitting: Family Medicine

## 2019-08-31 NOTE — Telephone Encounter (Signed)
Patient had to CX her last Botox apt because she was in the hospital amy has a open slot in the am and she can come but patient got her second vaccine and is worried about that is that ok ? Amy want have another opening until the end of May and patient states she will need to come before then if Dr. Epimenio Foot has to do .

## 2019-09-01 NOTE — Telephone Encounter (Signed)
LMVM for pt to call back to set up appt for botox.

## 2019-09-01 NOTE — Telephone Encounter (Signed)
Andrey Campanile is handling apt .

## 2019-09-01 NOTE — Telephone Encounter (Signed)
Can we get her in with Aundra Millet or Maralyn Sago? I am happy to get her in if I have any availability.

## 2019-09-01 NOTE — Telephone Encounter (Signed)
Spoke to pt and made appt for tomorrow for BOTOX, at 1100.

## 2019-09-01 NOTE — Telephone Encounter (Signed)
LM have available tomorrow with AMY (3 slots open at this time.

## 2019-09-02 ENCOUNTER — Ambulatory Visit (INDEPENDENT_AMBULATORY_CARE_PROVIDER_SITE_OTHER): Payer: Medicare Other | Admitting: Family Medicine

## 2019-09-02 ENCOUNTER — Other Ambulatory Visit: Payer: Self-pay | Admitting: Neurology

## 2019-09-02 DIAGNOSIS — G43709 Chronic migraine without aura, not intractable, without status migrainosus: Secondary | ICD-10-CM

## 2019-09-02 MED ORDER — OXYCODONE HCL 15 MG PO TABS: 15.0000 mg | ORAL_TABLET | Freq: Three times a day (TID) | ORAL | 0 refills | Status: DC | PRN

## 2019-09-02 NOTE — Telephone Encounter (Signed)
LMVM for pt confirming appt today at 11 am.

## 2019-09-02 NOTE — Telephone Encounter (Signed)
Called and LVM letting pt know AL,NP sent in rx for her.

## 2019-09-02 NOTE — Addendum Note (Signed)
Addended by: Shawnie Dapper L on: 09/02/2019 04:34 PM   Modules accepted: Orders

## 2019-09-02 NOTE — Progress Notes (Signed)
B/B BOTOX 200 UNITS/VIAL [id number] EXP-11/2021  [id number]  BACTERIOSTATIC 0.9% SODIUM CHLORIDE [phone] [id number] EXP-11/18/2019

## 2019-09-02 NOTE — Progress Notes (Signed)
 Consent Form Botulism Toxin Injection For Chronic Migraine    Reviewed orally with patient, additionally signature is on file:  Botulism toxin has been approved by the Federal drug administration for treatment of chronic migraine. Botulism toxin does not cure chronic migraine and it may not be effective in some patients.  The administration of botulism toxin is accomplished by injecting a small amount of toxin into the muscles of the neck and head. Dosage must be titrated for each individual. Any benefits resulting from botulism toxin tend to wear off after 3 months with a repeat injection required if benefit is to be maintained. Injections are usually done every 3-4 months with maximum effect peak achieved by about 2 or 3 weeks. Botulism toxin is expensive and you should be sure of what costs you will incur resulting from the injection.  The side effects of botulism toxin use for chronic migraine may include:   -Transient, and usually mild, facial weakness with facial injections  -Transient, and usually mild, head or neck weakness with head/neck injections  -Reduction or loss of forehead facial animation due to forehead muscle weakness  -Eyelid drooping  -Dry eye  -Pain at the site of injection or bruising at the site of injection  -Double vision  -Potential unknown long term risks   Contraindications: You should not have Botox if you are pregnant, nursing, allergic to albumin, have an infection, skin condition, or muscle weakness at the site of the injection, or have myasthenia gravis, Lambert-Eaton syndrome, or ALS.  It is also possible that as with any injection, there may be an allergic reaction or no effect from the medication. Reduced effectiveness after repeated injections is sometimes seen and rarely infection at the injection site may occur. All care will be taken to prevent these side effects. If therapy is given over a long time, atrophy and wasting in the muscle injected may  occur. Occasionally the patient's become refractory to treatment because they develop antibodies to the toxin. In this event, therapy needs to be modified.  I have read the above information and consent to the administration of botulism toxin.    BOTOX PROCEDURE NOTE FOR MIGRAINE HEADACHE  Contraindications and precautions discussed with patient(above). Aseptic procedure was observed and patient tolerated procedure. Procedure performed by Laylanie Kruczek, FNP-C.   The condition has existed for more than 6 months, and pt does not have a diagnosis of ALS, Myasthenia Gravis or Lambert-Eaton Syndrome.  Risks and benefits of injections discussed and pt agrees to proceed with the procedure.  Written consent obtained  These injections are medically necessary. Pt  receives good benefits from these injections. These injections do not cause sedations or hallucinations which the oral therapies may cause.   Description of procedure:  The patient was placed in a sitting position. The standard protocol was used for Botox as follows, with 5 units of Botox injected at each site:  -Procerus muscle, midline injection  -Corrugator muscle, bilateral injection  -Frontalis muscle, bilateral injection, with 2 sites each side, medial injection was performed in the upper one third of the frontalis muscle, in the region vertical from the medial inferior edge of the superior orbital rim. The lateral injection was again in the upper one third of the forehead vertically above the lateral limbus of the cornea, 1.5 cm lateral to the medial injection site.  -Temporalis muscle injection, 4 sites, bilaterally. The first injection was 3 cm above the tragus of the ear, second injection site was 1.5 cm to   3 cm up from the first injection site in line with the tragus of the ear. The third injection site was 1.5-3 cm forward between the first 2 injection sites. The fourth injection site was 1.5 cm posterior to the second injection  site. 5th site laterally in the temporalis  muscleat the level of the outer canthus.  -Occipitalis muscle injection, 3 sites, bilaterally. The first injection was done one half way between the occipital protuberance and the tip of the mastoid process behind the ear. The second injection site was done lateral and superior to the first, 1 fingerbreadth from the first injection. The third injection site was 1 fingerbreadth superiorly and medially from the first injection site.  -Cervical paraspinal muscle injection, 2 sites, bilaterally. The first injection site was 1 cm from the midline of the cervical spine, 3 cm inferior to the lower border of the occipital protuberance. The second injection site was 1.5 cm superiorly and laterally to the first injection site.  -Trapezius muscle injection was performed at 3 sites, bilaterally. The first injection site was in the upper trapezius muscle halfway between the inflection point of the neck, and the acromion. The second injection site was one half way between the acromion and the first injection site. The third injection was done between the first injection site and the inflection point of the neck.   Will return for repeat injection in 3 months.   A total of 200 units of Botox was prepared, 155 units of Botox was injected as documented above, any Botox not injected was wasted. The patient tolerated the procedure well, there were no complications of the above procedure.   

## 2019-09-02 NOTE — Telephone Encounter (Signed)
Pt called to make sure her request was submitted for her refill of oxyCODONE (ROXICODONE) 15 MG immediate release tablet to [facility] .  Pt said when she was in the office today she was told it was going to get forwarded to the RN

## 2019-09-03 MED ORDER — OXYCODONE HCL 15 MG PO TABS: 15.0000 mg | ORAL_TABLET | Freq: Three times a day (TID) | ORAL | 0 refills | Status: DC | PRN

## 2019-09-03 NOTE — Addendum Note (Signed)
Addended by: Marcelina Morel L on: 09/03/2019 11:06 AM   Modules accepted: Orders

## 2019-09-03 NOTE — Telephone Encounter (Signed)
Pt called stating that the medication was sent in to the wrong pharmacy. It is needing to be sent to [facility] not [facility]. Pt states pharmacy is faxing over paperwork to get this switched.

## 2019-09-05 ENCOUNTER — Other Ambulatory Visit: Payer: Self-pay | Admitting: Neurology

## 2019-09-06 NOTE — Telephone Encounter (Signed)
I called patient back to reschedule.

## 2019-09-06 NOTE — Telephone Encounter (Signed)
Pt called stating that she would like a morning appt for her Botox instead of the 3pm scheduled on July.

## 2019-09-07 DIAGNOSIS — Z8673 Personal history of transient ischemic attack (TIA), and cerebral infarction without residual deficits: Secondary | ICD-10-CM | POA: Insufficient documentation

## 2019-09-07 DIAGNOSIS — Z8719 Personal history of other diseases of the digestive system: Secondary | ICD-10-CM | POA: Insufficient documentation

## 2019-09-07 DIAGNOSIS — F1190 Opioid use, unspecified, uncomplicated: Secondary | ICD-10-CM | POA: Insufficient documentation

## 2019-09-30 ENCOUNTER — Other Ambulatory Visit: Payer: Self-pay | Admitting: Neurology

## 2019-09-30 MED ORDER — OXYCODONE HCL 15 MG PO TABS: 15.0000 mg | ORAL_TABLET | Freq: Three times a day (TID) | ORAL | 0 refills | Status: DC | PRN

## 2019-09-30 NOTE — Telephone Encounter (Signed)
Pt has called for a refill on her oxyCODONE (ROXICODONE) 15 MG immediate release tablet to [facility]

## 2019-09-30 NOTE — Addendum Note (Signed)
Addended by: Arther Abbott on: 09/30/2019 03:08 PM   Modules accepted: Orders

## 2019-11-03 ENCOUNTER — Other Ambulatory Visit: Payer: Self-pay | Admitting: Neurology

## 2019-11-03 MED ORDER — OXYCODONE HCL 15 MG PO TABS: 15.0000 mg | ORAL_TABLET | Freq: Three times a day (TID) | ORAL | 0 refills | Status: DC | PRN

## 2019-11-03 NOTE — Telephone Encounter (Signed)
[place] drug registry has been verified. Last refill was 10/04/2019 # 90 for a 30 day supply. Pt is due for refill tomorrow.

## 2019-11-03 NOTE — Addendum Note (Signed)
Addended by: Ann Maki on: 11/03/2019 09:55 AM   Modules accepted: Orders

## 2019-11-03 NOTE — Telephone Encounter (Signed)
Pt has called for her refill on oxyCODONE (ROXICODONE) 15 MG immediate release tablet to [facility]

## 2019-11-04 ENCOUNTER — Other Ambulatory Visit: Payer: Self-pay | Admitting: *Deleted

## 2019-11-04 MED ORDER — OXYCODONE HCL 15 MG PO TABS: 15.0000 mg | ORAL_TABLET | Freq: Three times a day (TID) | ORAL | 0 refills | Status: DC | PRN

## 2019-11-04 NOTE — Addendum Note (Signed)
Addended by: Ann Maki on: 11/04/2019 08:58 AM   Modules accepted: Orders

## 2019-11-04 NOTE — Telephone Encounter (Signed)
Patient called needing Oxycodone Rx sent to [facility], not [facility].

## 2019-11-25 ENCOUNTER — Other Ambulatory Visit: Payer: Self-pay | Admitting: Neurology

## 2019-12-01 ENCOUNTER — Other Ambulatory Visit: Payer: Self-pay | Admitting: Neurology

## 2019-12-08 ENCOUNTER — Ambulatory Visit: Payer: Medicare Other | Admitting: Neurology

## 2019-12-08 ENCOUNTER — Encounter: Payer: Self-pay | Admitting: Neurology

## 2019-12-08 ENCOUNTER — Other Ambulatory Visit: Payer: Self-pay

## 2019-12-08 VITALS — BP 127/83 | HR 70 | Ht 65.0 in | Wt 211.5 lb

## 2019-12-08 DIAGNOSIS — G4700 Insomnia, unspecified: Secondary | ICD-10-CM

## 2019-12-08 DIAGNOSIS — M797 Fibromyalgia: Secondary | ICD-10-CM

## 2019-12-08 DIAGNOSIS — M542 Cervicalgia: Secondary | ICD-10-CM | POA: Diagnosis not present

## 2019-12-08 DIAGNOSIS — G43709 Chronic migraine without aura, not intractable, without status migrainosus: Secondary | ICD-10-CM

## 2019-12-08 MED ORDER — TRAZODONE HCL 100 MG PO TABS: 100.0000 mg | ORAL_TABLET | Freq: Every day | ORAL | 3 refills | Status: DC

## 2019-12-08 NOTE — Progress Notes (Signed)
[facility]  PATIENT: Connie Arnold DOB: 01/28/1966    HISTORICAL  CHIEF COMPLAINT:  Chief Complaint  Patient presents with  . Botulinum Toxin Injection    RM 13, alone. Migraines. 200U vialx1. IWL:[id number]. Exp: 04/24. NDC: [id number].     HISTORY OF PRESENT ILLNESS:  Connie Arnold is a 54 y.o. woman with chronic migraine headaches and other neurologic issues.    Update 01/04/2019: Her migraines are doing better since starting Botox.   She has tolerated the Botox therapy well.  She still has some headaches every day but they are far less intense.  Her neck pain is also better.  She is still on Topiramate.    Her fibromyalgia pain is about the same.   She finds more benefit from oxycodone than other medications.   She takes oxycodone and tolerates it well.    She has not escalated and is not getting opiates from other doctors. PDMP Database has been reviewed.  She is also on Cymbalta.  She exercises some going to the gym.  She has had some difficulties with depression but feeling stable.  She continues to have poor sleep.    Trazodone has helped some.  Update 10/06/2018: Migraine headaches are occurring every day (30/30) and occur for more than 4 hours each day.  Her migraines are associated with throbbing pain, nausea and photophobia. Often the headaches are worse on the right Moving the head worsens the pain.  Topamax has reduced the intensity but not the frequency.  In the past she was on Botox and did very well with a reduction in both headache frequency and intensity.   we are able to get her back on Botox.   She also has neck pain.  The neck pain is worse on the right.  Neck pain is doing about the same.    Fibromyalgia pain is about the same and helped by her medications.   Update 03/09/2018: She is still having daily headaches though the intensity is better since starting Aimovig.    HA's are present when she wakes up and improve some days as the day  goes on and then it worsens in the evening again.    She gets some nausea but no vomiting.   She has photophobia and phonophobia.   Laying down helps the headaches when they get worse.She is also on Topamax with some benefit.       Fibromyalgia is a little better as she is doing some exercise which may have helped.    She takes oxycodone and tolerates it well.    She has not escalated and is not getting opiates from other doctors. We stopped her benzodiazepines earlier this year to reduce risk..   [place] Database has been reviewed.    She is on Benlysta and Plaquenil for her SLE.   Some of her pain could be coming from that as well.    Her depression is doing ok on Cymbalta and she tolerates it well.  Her insomnia is doing worse with sleep maintenance issues after 4-5 hours of sleep.      Update 10/27/2017: She continues to have migraine headaches on a daily basis.   These occur across her scalp.     HA's improve with sleep and get worse if she is sleep deprived.  Pain is throbbing.   She gets nausea but no vomiting.    She has photo/phonophobia    Also, she gets benefit from the splenius capitus TPI/ONBs  performed every 4 months or so.,     In the past, Botox had helped.   Her insurance does not cover it well.   They also did not cover Ajovy.      FMS pain is about the same.    Oxycodone helps pain some and is well tolerated.  [place] database reviewed and she is compliant and not getting controlled substances elsewhere.   No drug seeking behavior.     She also has lupus which may be playing a role  She still has trouble falling asleep.   At the last visit, we simplified the controlled substances by elinminating hte benzodiazepine and continuing oxycodone.   Trazodone may have helped some but is out.  Melatonin helps her fall asleep.      Update 06/27/17: She feels her headaches are doing about the same.   She has more pain in the neck and upper back.   Neck pain radiates into the back of the head.   In  the past, TPI's or nerve blocks have helped her pain.    She switched from Topamax to Keppra but feels the Topamax helped more.    She is also on muscle relaxants.   HA's are back to daily (30/30 days > 4 hours a day).    She was diagnosed with SLE and is on steroids and Plaquenil and Benlysta.   She has generalized joint pain.   Her right foot/ankle are especially painful.  The [place] controlled database was reviewed and she is compliant and doe not get controlled substances from other doctors.     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    This improved and she no longer has papilledema.       Update 12/26/2016: HA/neck pain:   She has had a severe headache for the past 5 days. Current pain is in the neck and head, right > left.     Pain is worse with movements and better with bedrest.    She gets nausea but not vomiting.   She notes photo and phonophobia.    She takes 15 mg po tid oxycodone.    It takes the edge off but does not eliminate the pain.   Topamax 200 mg poi bid, tizanidine and baclofen have only  helped a little bit.      Her headaches have done best on Botox in the past. Unfortunately, due to very high co-pays was unable to go back on in the past.  Chronic Migraine:  She is better the last 30 days with only 8 -10 migraines (usually 28-30/30).      Headaches last no more than 4 hours a day.    When present, pain is bilateral and throbbing.   There is often neck pain.    She has photophobia, phonophobia and nausea with headaches.  No vomiting.   Headaches are worse in the afternoons   Moving intensifies the pain.   The combination of Topamax and Keppra works better than Topamax alone.  Neck Pain:  Neck pain is better on steroids.    There is still some right > left pain.  In the past, occipital nerve blocks have helped the neck pain and often the headaches when they become worse.  Pain med's:    Currently, she is on oxycodone 15 mg by mouth 3 times a day and  alprazolam 3 times a day. She is also on baclofen and tizanidine for  spasticity.   She is on topiramate 200 mg po bid and Keppra for the headaches.  Mood:   Mood is currently doing well. She also thinks anxiety is doing a little bit better. She is less apathetic. She is on duloxetine.  SLE:   She see Dr. Nydia Bouton at [facility].   She was diagnosed with SLE in 2006 and has been on Plaquenil x years and more recently Benlysta.    Her Complements were normal 11/17 but ANA is positive (speckled)    ------------ Chronic Migraine History:   She has had chronic migraine headache with moderate pain, flaring up to severe,  for most of the past couple of years   She is having chronic migraine 30/30 days every month, lasting for more than 4 hours every day.  She has received some benefit from occipital nerve blocks, trigger point injections and medications but the headaches still persist on a daily basis for past couple years.    She had been on Botox for migraine prophylaxis in the past with benefit. However, with changes in insurance she was unable to continue due to very high co-pay. She is currently on Topamax 200 mg by mouth twice a day and muscle relaxants and opiates.  She has occasional emergency room visits and frequent additional visits to my office for trigger point injections..  She has tried and failed multiple prophylactic medications for her chronic migraine: Antiepileptics:    zonisamide, topiramate, Keppra.   (Due to obesity, Depakote is a poor choice) Anti-depressants  amitriptyline, imipramine Anti-hypertensives:  Diamox Muscle relaxants:  baclofen, tizanidine NSAID's:  She is unable to tolerate chronic anti-inflammatories due to mild renal insufficiency.  She has had multiple visits to ER and to our office for injections when pain is more severe.     ALLERGIES: Allergies  Allergen Reactions  . Nsaids Other (See Comments)    Chronic kidney failure  . Codeine   . Ramipril   . Imitrex  [Sumatriptan] Nausea And Vomiting    HOME MEDICATIONS:  Current Outpatient Medications:  .  ADVAIR DISKUS 250-50 MCG/DOSE AEPB, Inhale 1 puff into the lungs as needed. , Disp: , Rfl:  .  albuterol (PROVENTIL HFA;VENTOLIN HFA) 108 (90 Base) MCG/ACT inhaler, Inhale into the lungs., Disp: , Rfl:  .  albuterol (PROVENTIL) (2.5 MG/3ML) 0.083% nebulizer solution, , Disp: , Rfl:  .  Azelastine-Fluticasone 137-50 MCG/ACT SUSP, Place 1 spray into the nose as needed. , Disp: , Rfl:  .  baclofen (LIORESAL) 10 MG tablet, TAKE 1 TO 2 TABLETS BY  MOUTH 3 TIMES DAILY, Disp: 540 tablet, Rfl: 3 .  Belimumab (BENLYSTA) 200 MG/ML SOAJ, Inject 1 Dose into the skin once a week. , Disp: , Rfl:  .  bupivacaine (MARCAINE PRESERVATIVE FREE) 0.5 % SOLN injection, , Disp: , Rfl:  .  cetirizine (ZYRTEC) 10 MG tablet, Take 10 mg by mouth daily. , Disp: , Rfl:  .  DULoxetine (CYMBALTA) 60 MG capsule, Take 1 capsule (60 mg total) by mouth daily., Disp: 90 capsule, Rfl: 4 .  fluconazole (DIFLUCAN) 150 MG tablet, Take 150 mg by mouth once. , Disp: , Rfl:  .  FLUoxetine (PROZAC) 40 MG capsule, Take 40 mg by mouth daily. , Disp: , Rfl:  .  furosemide (LASIX) 40 MG tablet, Take 40 mg by mouth daily. , Disp: , Rfl:  .  glucose blood test strip, , Disp: , Rfl:  .  heparin 40981 UNIT/ML injection, Instill 4 cc into bladder 2x daily,  Disp: , Rfl:  .  hydroxychloroquine (PLAQUENIL) 200 MG tablet, Take 1 tablet (200 mg total) by mouth 2 (two) times daily., Disp: 30 tablet, Rfl: 0 .  ipratropium-albuterol (DUONEB) 0.5-2.5 (3) MG/3ML SOLN, 3 mLs as needed. , Disp: , Rfl:  .  lactulose, encephalopathy, (CHRONULAC) 10 GM/15ML SOLN, daily as needed. , Disp: , Rfl:  .  montelukast (SINGULAIR) 10 MG tablet, Take 10 mg by mouth at bedtime. , Disp: , Rfl:  .  oxyCODONE (ROXICODONE) 15 MG immediate release tablet, TAKE ONE TABLET BY MOUTH EVERY EIGHT HOURS AS NEEDED FOR PAIN, Disp: 90 tablet, Rfl: 0 .  phentermine 37.5 MG capsule, Take 1  capsule by mouth once daily in the morning, Disp: 30 capsule, Rfl: 5 .  predniSONE (DELTASONE) 5 MG tablet, Take 4 tablets for 3 days, 3 tablets for 3 days, 2 tablets for 3 days, 1 tablet for 3 days and then stop., Disp: , Rfl:  .  promethazine (PHENERGAN) 50 MG/ML injection, Inject into the muscle as needed. , Disp: , Rfl:  .  topiramate (TOPAMAX) 200 MG tablet, TAKE 1 TABLET BY MOUTH  TWICE DAILY, Disp: 180 tablet, Rfl: 3 .  traZODone (DESYREL) 100 MG tablet, Take 1 tablet (100 mg total) by mouth at bedtime., Disp: 90 tablet, Rfl: 3 .  triamcinolone (NASACORT) 55 MCG/ACT AERO nasal inhaler, Place 2 sprays into the nose., Disp: , Rfl:  .  trimethoprim (TRIMPEX) 100 MG tablet, Take 1 tablet by mouth  daily, Disp: , Rfl:  .  Vitamin D, Ergocalciferol, (DRISDOL) 50000 UNITS CAPS capsule, Take 50,000 Units by mouth every 7 (seven) days. , Disp: , Rfl:   PAST MEDICAL HISTORY: Past Medical History:  Diagnosis Date  . Asthma   . Carpal tunnel syndrome 09/29/2014  . Chronic back pain   . Chronic kidney disease   . Diabetes mellitus without complication (HCC)   . Fatty infiltration of liver 03/17/2015  . Fibromyalgia   . Hypertension   . IBS (irritable bowel syndrome)   . Interstitial cystitis   . Lupus (HCC)   . Lupus (systemic lupus erythematosus) (HCC)   . Migraines   . Pseudotumor cerebri     PAST SURGICAL HISTORY: Past Surgical History:  Procedure Laterality Date  . ABDOMINAL HYSTERECTOMY    . APPENDECTOMY    . CHOLECYSTECTOMY    . CYSTOSCOPY      FAMILY HISTORY: Family History  Problem Relation Age of Onset  . Graves' disease Mother   . Heart disease Father   . Prostate cancer Father   . Healthy Brother   . Healthy Maternal Aunt   . Healthy Maternal Uncle     SOCIAL HISTORY:  Social History   Socioeconomic History  . Marital status: Married    Spouse name: Not on file  . Number of children: Not on file  . Years of education: Not on file  . Highest education  level: Not on file  Occupational History  . Not on file  Tobacco Use  . Smoking status: Never Smoker  . Smokeless tobacco: Never Used  Substance and Sexual Activity  . Alcohol use: No  . Drug use: No  . Sexual activity: Not on file  Other Topics Concern  . Not on file  Social History Narrative  . Not on file   Social Determinants of Health   Financial Resource Strain:   . Difficulty of Paying Living Expenses:   Food Insecurity:   . Worried About Programme researcher, broadcasting/film/video  in the Last Year:   . Ran Out of Food in the Last Year:   Transportation Needs:   . Freight forwarder (Medical):   Marland Kitchen Lack of Transportation (Non-Medical):   Physical Activity:   . Days of Exercise per Week:   . Minutes of Exercise per Session:   Stress:   . Feeling of Stress :   Social Connections:   . Frequency of Communication with Friends and Family:   . Frequency of Social Gatherings with Friends and Family:   . Attends Religious Services:   . Active Member of Clubs or Organizations:   . Attends Banker Meetings:   Marland Kitchen Marital Status:   Intimate Partner Violence:   . Fear of Current or Ex-Partner:   . Emotionally Abused:   Marland Kitchen Physically Abused:   . Sexually Abused:      PHYSICAL EXAM  Vitals:   12/08/19 1518  BP: 127/83  Pulse: 70  Weight: 211 lb 8 oz (95.9 kg)  Height: 5\' 5"  (1.651 m)    Body mass index is 35.2 kg/m.   General: The patient is an obese woman in no distress.     Neck: She has mild tenderness over the splenius capitis muscles, right greater than left.  Neurologic Exam  Mental status: The patient is alert and oriented x 3 at the time of the examination. The patient has apparent normal recent and remote memory, with an apparently normal attention span and concentration ability.   Speech is normal.  Cranial nerves: Extraocular movements are full.     Facial strength is normal.  Trapezius strength is normal.    Motor:  Muscle bulk is normal.   Tone is  normal. Strength is  5 / 5 in limbs   Gait and station: Station is normal.   Gait is normal.  Tandem gait is normal.     DIAGNOSTIC DATA (LABS, IMAGING, TESTING) - I reviewed patient records, labs, notes, testing and imaging myself where available.  Lab Results  Component Value Date   WBC 5.9 02/19/2015   HGB 12.7 02/19/2015   HCT 39.4 02/19/2015   MCV 89.5 02/19/2015   PLT 255 02/19/2015        ASSESSMENT AND PLAN    1. Chronic migraine without aura without status migrainosus, not intractable   2. Fibromyalgia   3. Neck pain   4. Insomnia, unspecified type     1.    Botox 200 units as follows:: 95 units into muscles of the face and head as follows: Frontalis (5 units x 2 on the left, 5 units x 2 on the right), procerus and corrugators (5 units x 3), temporalis (5 units x 4 on the left and 5 units x 4 on the right), occipitalis (5 units x 2 on the left and 5 units x 2 on the right) 105 Units into the muscles of the neck as follows: Splenius capitis (12.5 units x 2), Splenius  cervicus (7.5 units x 2), C6-C7 paraspinal (5 units x 2), trapezius (15 units x 2); rhomboid 12.5 U x 2 2.  Continue oxycodone.  The PDMP has been reviewed and she is not getting medications from other doctors.  Refill phentermine. 3.   Stay active and exercise as tolerated. 4.   return in 3 months or sooner if there are new or worsening neurologic symptoms.  Memori Sammon A. [email or web address], MD, PhD 12/08/2019, 4:01 PM Certified in Neurology, Clinical Neurophysiology, Sleep Medicine, Pain Medicine and Neuroimaging  [facility]  [place] [place], [place] [address] [phone]

## 2019-12-15 ENCOUNTER — Ambulatory Visit: Payer: Medicare Other | Admitting: Neurology

## 2020-01-03 ENCOUNTER — Other Ambulatory Visit: Payer: Self-pay | Admitting: Neurology

## 2020-02-03 ENCOUNTER — Other Ambulatory Visit: Payer: Self-pay | Admitting: Neurology

## 2020-03-02 ENCOUNTER — Other Ambulatory Visit: Payer: Self-pay | Admitting: Neurology

## 2020-03-09 ENCOUNTER — Ambulatory Visit: Payer: Medicare Other | Admitting: Neurology

## 2020-03-09 ENCOUNTER — Encounter: Payer: Self-pay | Admitting: Neurology

## 2020-03-15 ENCOUNTER — Encounter: Payer: Self-pay | Admitting: Neurology

## 2020-03-15 ENCOUNTER — Other Ambulatory Visit: Payer: Self-pay

## 2020-03-15 ENCOUNTER — Ambulatory Visit: Payer: Medicare Other | Admitting: Neurology

## 2020-03-15 VITALS — BP 139/89 | HR 67 | Ht 65.0 in | Wt 217.5 lb

## 2020-03-15 DIAGNOSIS — M542 Cervicalgia: Secondary | ICD-10-CM

## 2020-03-15 DIAGNOSIS — G43709 Chronic migraine without aura, not intractable, without status migrainosus: Secondary | ICD-10-CM | POA: Diagnosis not present

## 2020-03-15 DIAGNOSIS — M797 Fibromyalgia: Secondary | ICD-10-CM

## 2020-03-15 DIAGNOSIS — G4700 Insomnia, unspecified: Secondary | ICD-10-CM | POA: Diagnosis not present

## 2020-03-15 DIAGNOSIS — F32A Depression, unspecified: Secondary | ICD-10-CM

## 2020-03-15 MED ORDER — PHENTERMINE HCL 37.5 MG PO CAPS
ORAL_CAPSULE | ORAL | 5 refills | Status: DC
Start: 2020-03-15 — End: 2020-09-12

## 2020-03-15 NOTE — Progress Notes (Signed)
[facility]  PATIENT: Connie Arnold DOB: 1965/11/27    HISTORICAL  CHIEF COMPLAINT:  Chief Complaint  Patient presents with  . Botulinum Toxin Injection    RM 13, alone. Last botox 12/08/19. Buy/Bill-200U vial for migraines. Lot: [id number]. Exp: 06/24. NDC: 1224-8250-03    HISTORY OF PRESENT ILLNESS:  Connie Arnold is a 54 y.o. woman with chronic migraine headaches and other neurologic issues.    Update 03/15/2020: She has had a lot of headaches since covid-19 last month.  Botox is helping in general but she noted that chronic migraines returned after he developed COVID-19.Marland Kitchen   Her last injections were 12/08/2019.  She has tolerated the injections well.  She is also on topiramate to help with her chronic migraines.  Her fibromyalgia pain is about the same.   She finds more benefit from oxycodone than other medications.   She takes oxycodone and tolerates it well.    She has not escalated and is not getting opiates from other doctors. PDMP Database has been reviewed.  She is also on Cymbalta.  She exercises some going to the gym.   She has sleep maintenance greater than sleep onset insomnia.  She has had depression but feels this is stable.     ------------ Chronic Migraine History:   She has had chronic migraine headache with moderate pain, flaring up to severe,  for most of the past couple of years   She is having chronic migraine 30/30 days every month, lasting for more than 4 hours every day.  She has received some benefit from occipital nerve blocks, trigger point injections and medications but the headaches still persist on a daily basis for past couple years.    She had been on Botox for migraine prophylaxis in the past with benefit. However, with changes in insurance she was unable to continue due to very high co-pay. She is currently on Topamax 200 mg by mouth twice a day and muscle relaxants and opiates.  She has occasional emergency room visits and  frequent additional visits to my office for trigger point injections..  She has tried and failed multiple prophylactic medications for her chronic migraine: Antiepileptics:    zonisamide, topiramate, Keppra.   (Due to obesity, Depakote is a poor choice) Anti-depressants  amitriptyline, imipramine Anti-hypertensives:  Diamox Muscle relaxants:  baclofen, tizanidine NSAID's:  She is unable to tolerate chronic anti-inflammatories due to mild renal insufficiency.  She has had multiple visits to ER and to our office for injections when pain is more severe.     SLE:   She see Dr. Nydia Bouton at [facility].   She was diagnosed with SLE in 2006 and has been on Plaquenil x years and more recently Benlysta.    Her Complements were normal 11/17 but ANA is positive (speckled)   ALLERGIES: Allergies  Allergen Reactions  . Nsaids Other (See Comments)    Chronic kidney failure  . Codeine   . Ramipril   . Imitrex [Sumatriptan] Nausea And Vomiting    HOME MEDICATIONS:  Current Outpatient Medications:  .  ADVAIR DISKUS 250-50 MCG/DOSE AEPB, Inhale 1 puff into the lungs as needed. , Disp: , Rfl:  .  albuterol (PROVENTIL HFA;VENTOLIN HFA) 108 (90 Base) MCG/ACT inhaler, Inhale into the lungs., Disp: , Rfl:  .  albuterol (PROVENTIL) (2.5 MG/3ML) 0.083% nebulizer solution, , Disp: , Rfl:  .  Azelastine-Fluticasone 137-50 MCG/ACT SUSP, Place 1 spray into the nose as needed. , Disp: , Rfl:  .  baclofen (  LIORESAL) 10 MG tablet, TAKE 1 TO 2 TABLETS BY  MOUTH 3 TIMES DAILY, Disp: 540 tablet, Rfl: 3 .  Belimumab (BENLYSTA) 200 MG/ML SOAJ, Inject 1 Dose into the skin once a week. , Disp: , Rfl:  .  bupivacaine (MARCAINE PRESERVATIVE FREE) 0.5 % SOLN injection, , Disp: , Rfl:  .  cetirizine (ZYRTEC) 10 MG tablet, Take 10 mg by mouth daily. , Disp: , Rfl:  .  DULoxetine (CYMBALTA) 60 MG capsule, Take 1 capsule (60 mg total) by mouth daily., Disp: 90 capsule, Rfl: 4 .  fluconazole (DIFLUCAN) 150 MG tablet, Take 150 mg by mouth  once. , Disp: , Rfl:  .  FLUoxetine (PROZAC) 40 MG capsule, Take 40 mg by mouth daily. , Disp: , Rfl:  .  furosemide (LASIX) 40 MG tablet, Take 40 mg by mouth daily. , Disp: , Rfl:  .  glucose blood test strip, , Disp: , Rfl:  .  heparin 07371 UNIT/ML injection, Instill 4 cc into bladder 2x daily, Disp: , Rfl:  .  hydroxychloroquine (PLAQUENIL) 200 MG tablet, Take 1 tablet (200 mg total) by mouth 2 (two) times daily., Disp: 30 tablet, Rfl: 0 .  ipratropium-albuterol (DUONEB) 0.5-2.5 (3) MG/3ML SOLN, 3 mLs as needed. , Disp: , Rfl:  .  lactulose, encephalopathy, (CHRONULAC) 10 GM/15ML SOLN, daily as needed. , Disp: , Rfl:  .  montelukast (SINGULAIR) 10 MG tablet, Take 10 mg by mouth at bedtime. , Disp: , Rfl:  .  oxyCODONE (ROXICODONE) 15 MG immediate release tablet, TAKE ONE TABLET BY MOUTH EVERY EIGHT HOURS AS NEEDED FOR PAIN, Disp: 90 tablet, Rfl: 0 .  phentermine 37.5 MG capsule, Take 1 capsule by mouth once daily in the morning, Disp: 30 capsule, Rfl: 5 .  predniSONE (DELTASONE) 5 MG tablet, Take 4 tablets for 3 days, 3 tablets for 3 days, 2 tablets for 3 days, 1 tablet for 3 days and then stop., Disp: , Rfl:  .  promethazine (PHENERGAN) 50 MG/ML injection, Inject into the muscle as needed. , Disp: , Rfl:  .  topiramate (TOPAMAX) 200 MG tablet, TAKE 1 TABLET BY MOUTH  TWICE DAILY, Disp: 180 tablet, Rfl: 3 .  traZODone (DESYREL) 100 MG tablet, Take 1 tablet (100 mg total) by mouth at bedtime., Disp: 90 tablet, Rfl: 3 .  triamcinolone (NASACORT) 55 MCG/ACT AERO nasal inhaler, Place 2 sprays into the nose., Disp: , Rfl:  .  trimethoprim (TRIMPEX) 100 MG tablet, Take 1 tablet by mouth  daily, Disp: , Rfl:  .  Vitamin D, Ergocalciferol, (DRISDOL) 50000 UNITS CAPS capsule, Take 50,000 Units by mouth every 7 (seven) days. , Disp: , Rfl:   PAST MEDICAL HISTORY: Past Medical History:  Diagnosis Date  . Asthma   . Carpal tunnel syndrome 09/29/2014  . Chronic back pain   . Chronic kidney disease    . Diabetes mellitus without complication (HCC)   . Fatty infiltration of liver 03/17/2015  . Fibromyalgia   . Hypertension   . IBS (irritable bowel syndrome)   . Interstitial cystitis   . Lupus (HCC)   . Lupus (systemic lupus erythematosus) (HCC)   . Migraines   . Pseudotumor cerebri     PAST SURGICAL HISTORY: Past Surgical History:  Procedure Laterality Date  . ABDOMINAL HYSTERECTOMY    . APPENDECTOMY    . CHOLECYSTECTOMY    . CYSTOSCOPY      FAMILY HISTORY: Family History  Problem Relation Age of Onset  . Graves' disease Mother   .  Heart disease Father   . Prostate cancer Father   . Healthy Brother   . Healthy Maternal Aunt   . Healthy Maternal Uncle     SOCIAL HISTORY:  Social History   Socioeconomic History  . Marital status: Married    Spouse name: Not on file  . Number of children: Not on file  . Years of education: Not on file  . Highest education level: Not on file  Occupational History  . Not on file  Tobacco Use  . Smoking status: Never Smoker  . Smokeless tobacco: Never Used  Substance and Sexual Activity  . Alcohol use: No  . Drug use: No  . Sexual activity: Not on file  Other Topics Concern  . Not on file  Social History Narrative  . Not on file   Social Determinants of Health   Financial Resource Strain:   . Difficulty of Paying Living Expenses: Not on file  Food Insecurity:   . Worried About Programme researcher, broadcasting/film/video in the Last Year: Not on file  . Ran Out of Food in the Last Year: Not on file  Transportation Needs:   . Lack of Transportation (Medical): Not on file  . Lack of Transportation (Non-Medical): Not on file  Physical Activity:   . Days of Exercise per Week: Not on file  . Minutes of Exercise per Session: Not on file  Stress:   . Feeling of Stress : Not on file  Social Connections:   . Frequency of Communication with Friends and Family: Not on file  . Frequency of Social Gatherings with Friends and Family: Not on file  .  Attends Religious Services: Not on file  . Active Member of Clubs or Organizations: Not on file  . Attends Banker Meetings: Not on file  . Marital Status: Not on file  Intimate Partner Violence:   . Fear of Current or Ex-Partner: Not on file  . Emotionally Abused: Not on file  . Physically Abused: Not on file  . Sexually Abused: Not on file     PHYSICAL EXAM  Vitals:   03/15/20 1122  BP: 139/89  Pulse: 67  Weight: 217 lb 8 oz (98.7 kg)  Height: 5\' 5"  (1.651 m)    Body mass index is 36.19 kg/m.   General: The patient is an obese woman in no distress.     Neck: She has mild tenderness over the splenius capitis muscles, right greater than left.  Neurologic Exam  Mental status: The patient is alert and oriented x 3 at the time of the examination. The patient has apparent normal recent and remote memory, with an apparently normal attention span and concentration ability.   Speech is normal.  Cranial nerves: Extraocular movements are full.     Facial strength is normal.  Trapezius and sternocleidomastoid strength is normal.  Motor:  Muscle bulk is normal.   Tone is normal. Strength is  5 / 5 in limbs   Gait and station: Station is normal.   Gait is normal.  Tandem gait is normal.     DIAGNOSTIC DATA (LABS, IMAGING, TESTING) - I reviewed patient records, labs, notes, testing and imaging myself where available.  Lab Results  Component Value Date   WBC 5.9 02/19/2015   HGB 12.7 02/19/2015   HCT 39.4 02/19/2015   MCV 89.5 02/19/2015   PLT 255 02/19/2015        ASSESSMENT AND PLAN    1. Chronic migraine without aura without  status migrainosus, not intractable   2. Fibromyalgia   3. Neck pain   4. Insomnia, unspecified type   5. Depression, unspecified depression type     1.    Botox 200 units as follows:: 95 units into muscles of the face and head as follows: Frontalis (5 units x 2 on the left, 5 units x 2 on the right), procerus and corrugators  (5 units x 3), temporalis (5 units x 4 on the left and 5 units x 4 on the right), occipitalis (5 units x 2 on the left and 5 units x 2 on the right) 105 Units into the muscles of the neck as follows: Splenius capitis (12.5 units x 2), Splenius  cervicus (7.5 units x 2), C6-C7 paraspinal (5 units x 2), trapezius (15 units x 2); rhomboid 12.5 U x 2 2.  Continue oxycodone for fibromyalgia pain..  The PDMP has been reviewed and she is not getting medications from other doctors.  Refill phentermine for weight and attention deficit/fatigue. 3.   Stay active and exercise as tolerated. 4.   return in 3 months or sooner if there are new or worsening neurologic symptoms.  Aury Scollard A. Epimenio Foot, MD, PhD 03/15/2020, 5:50 PM Certified in Neurology, Clinical Neurophysiology, Sleep Medicine, Pain Medicine and Neuroimaging  [facility] [address] [place], [place] [address] [phone]

## 2020-04-03 ENCOUNTER — Other Ambulatory Visit: Payer: Self-pay | Admitting: Neurology

## 2020-05-02 ENCOUNTER — Other Ambulatory Visit: Payer: Self-pay | Admitting: Neurology

## 2020-06-01 ENCOUNTER — Other Ambulatory Visit: Payer: Self-pay | Admitting: Neurology

## 2020-06-13 ENCOUNTER — Ambulatory Visit: Payer: Medicare Other | Admitting: Neurology

## 2020-06-13 ENCOUNTER — Encounter: Payer: Self-pay | Admitting: Neurology

## 2020-06-13 VITALS — BP 143/89 | HR 75 | Ht 65.0 in | Wt 223.0 lb

## 2020-06-13 DIAGNOSIS — F32A Depression, unspecified: Secondary | ICD-10-CM | POA: Diagnosis not present

## 2020-06-13 DIAGNOSIS — M542 Cervicalgia: Secondary | ICD-10-CM

## 2020-06-13 DIAGNOSIS — G43709 Chronic migraine without aura, not intractable, without status migrainosus: Secondary | ICD-10-CM | POA: Diagnosis not present

## 2020-06-13 DIAGNOSIS — M797 Fibromyalgia: Secondary | ICD-10-CM | POA: Diagnosis not present

## 2020-06-13 NOTE — Progress Notes (Signed)
[facility]  PATIENT: Connie Arnold DOB: [birth year]    HISTORICAL  CHIEF COMPLAINT:  Chief Complaint  Patient presents with  . Botulinum Toxin Injection    RM 13, alone. B/B 100Ux2vials. Lot: [id number]. Exp: 02/24. NDC: [id number].     HISTORY OF PRESENT ILLNESS:  Connie Arnold is a 55 y.o. woman with chronic migraine headaches and other neurologic issues.    Update 03/15/2020: She has had about 15 migraines the last 30 days but did better the first 2  Months of the botox cycle.  Her last injections were 03/15/2020.  She has tolerated the injections well.  She is also on topiramate to help with her chronic migraines.  HA's are pounding and associated with photophobia, phonophobia and nausea.    Fibromyalgia pain is a little worse and she notes being less active.    She finds more benefit from oxycodone than other medications.   She   tolerates it well.    She has not escalated and is not getting opiates from other doctors. PDMP Database has been reviewed.  She is also on Cymbalta for the FMS.  She was exercising at the gym but due to increased Covid cases she has not gone in 4 months  She has sleep maintenance greater than sleep onset insomnia.  She has had depression but feels this is stable.     ------------ Chronic Migraine History:   She has had chronic migraine headache with moderate pain, flaring up to severe,  for most of the past couple of years   She is having chronic migraine 30/30 days every month, lasting for more than 4 hours every day.  She has received some benefit from occipital nerve blocks, trigger point injections and medications but the headaches still persist on a daily basis for past couple years.    She had been on Botox for migraine prophylaxis in the past with benefit. However, with changes in insurance she was unable to continue due to very high co-pay. She is currently on Topamax 200 mg by mouth twice a day and muscle relaxants and  opiates.  She has occasional emergency room visits and frequent additional visits to my office for trigger point injections..  She has tried and failed multiple prophylactic medications for her chronic migraine: Antiepileptics:    zonisamide, topiramate, Keppra.   (Due to obesity, Depakote is a poor choice) Anti-depressants  amitriptyline, imipramine Anti-hypertensives:  Diamox Muscle relaxants:  baclofen, tizanidine NSAID's:  She is unable to tolerate chronic anti-inflammatories due to mild renal insufficiency.  She has had multiple visits to ER and to our office for injections when pain is more severe.     SLE:   She see Dr. Nydia Bouton at [facility].   She was diagnosed with SLE in 2006 and has been on Plaquenil x years and more recently Benlysta.    Her Complements were normal 11/17 but ANA is positive (speckled)   ALLERGIES: Allergies  Allergen Reactions  . Nsaids Other (See Comments)    Chronic kidney failure  . Codeine   . Ramipril   . Imitrex [Sumatriptan] Nausea And Vomiting    HOME MEDICATIONS:  Current Outpatient Medications:  .  ADVAIR DISKUS 250-50 MCG/DOSE AEPB, Inhale 1 puff into the lungs as needed. , Disp: , Rfl:  .  albuterol (PROVENTIL HFA;VENTOLIN HFA) 108 (90 Base) MCG/ACT inhaler, Inhale into the lungs., Disp: , Rfl:  .  albuterol (PROVENTIL) (2.5 MG/3ML) 0.083% nebulizer solution, , Disp: , Rfl:  .  Azelastine-Fluticasone 137-50 MCG/ACT SUSP, Place 1 spray into the nose as needed. , Disp: , Rfl:  .  baclofen (LIORESAL) 10 MG tablet, TAKE 1 TO 2 TABLETS BY  MOUTH 3 TIMES DAILY, Disp: 540 tablet, Rfl: 3 .  Belimumab 200 MG/ML SOAJ, Inject 1 Dose into the skin once a week. , Disp: , Rfl:  .  bupivacaine (MARCAINE PRESERVATIVE FREE) 0.5 % SOLN injection, , Disp: , Rfl:  .  cetirizine (ZYRTEC) 10 MG tablet, Take 10 mg by mouth daily. , Disp: , Rfl:  .  DULoxetine (CYMBALTA) 60 MG capsule, Take 1 capsule (60 mg total) by mouth daily., Disp: 90 capsule, Rfl: 4 .  fluconazole  (DIFLUCAN) 150 MG tablet, Take 150 mg by mouth once. , Disp: , Rfl:  .  FLUoxetine (PROZAC) 40 MG capsule, Take 40 mg by mouth daily. , Disp: , Rfl:  .  furosemide (LASIX) 40 MG tablet, Take 40 mg by mouth daily. , Disp: , Rfl:  .  glucose blood test strip, , Disp: , Rfl:  .  heparin 64158 UNIT/ML injection, Instill 4 cc into bladder 2x daily, Disp: , Rfl:  .  hydroxychloroquine (PLAQUENIL) 200 MG tablet, Take 1 tablet (200 mg total) by mouth 2 (two) times daily., Disp: 30 tablet, Rfl: 0 .  ipratropium-albuterol (DUONEB) 0.5-2.5 (3) MG/3ML SOLN, 3 mLs as needed. , Disp: , Rfl:  .  lactulose, encephalopathy, (CHRONULAC) 10 GM/15ML SOLN, daily as needed. , Disp: , Rfl:  .  montelukast (SINGULAIR) 10 MG tablet, Take 10 mg by mouth at bedtime. , Disp: , Rfl:  .  oxyCODONE (ROXICODONE) 15 MG immediate release tablet, TAKE ONE TABLET BY MOUTH EVERY EIGHT HOURS AS NEEDED FOR PAIN, Disp: 90 tablet, Rfl: 0 .  phentermine 37.5 MG capsule, Take 1 capsule by mouth once daily in the morning, Disp: 30 capsule, Rfl: 5 .  predniSONE (DELTASONE) 5 MG tablet, Take 4 tablets for 3 days, 3 tablets for 3 days, 2 tablets for 3 days, 1 tablet for 3 days and then stop., Disp: , Rfl:  .  promethazine (PHENERGAN) 50 MG/ML injection, Inject into the muscle as needed. , Disp: , Rfl:  .  topiramate (TOPAMAX) 200 MG tablet, TAKE 1 TABLET BY MOUTH  TWICE DAILY, Disp: 180 tablet, Rfl: 3 .  traZODone (DESYREL) 100 MG tablet, Take 1 tablet (100 mg total) by mouth at bedtime., Disp: 90 tablet, Rfl: 3 .  triamcinolone (NASACORT) 55 MCG/ACT AERO nasal inhaler, Place 2 sprays into the nose., Disp: , Rfl:  .  trimethoprim (TRIMPEX) 100 MG tablet, Take 1 tablet by mouth  daily, Disp: , Rfl:  .  Vitamin D, Ergocalciferol, (DRISDOL) 50000 UNITS CAPS capsule, Take 50,000 Units by mouth every 7 (seven) days. , Disp: , Rfl:   PAST MEDICAL HISTORY: Past Medical History:  Diagnosis Date  . Asthma   . Carpal tunnel syndrome 09/29/2014  .  Chronic back pain   . Chronic kidney disease   . Diabetes mellitus without complication (HCC)   . Fatty infiltration of liver 03/17/2015  . Fibromyalgia   . Hypertension   . IBS (irritable bowel syndrome)   . Interstitial cystitis   . Lupus (HCC)   . Lupus (systemic lupus erythematosus) (HCC)   . Migraines   . Pseudotumor cerebri     PAST SURGICAL HISTORY: Past Surgical History:  Procedure Laterality Date  . ABDOMINAL HYSTERECTOMY    . APPENDECTOMY    . CHOLECYSTECTOMY    . CYSTOSCOPY  FAMILY HISTORY: Family History  Problem Relation Age of Onset  . Graves' disease Mother   . Heart disease Father   . Prostate cancer Father   . Healthy Brother   . Healthy Maternal Aunt   . Healthy Maternal Uncle     SOCIAL HISTORY:  Social History   Socioeconomic History  . Marital status: Married    Spouse name: Not on file  . Number of children: Not on file  . Years of education: Not on file  . Highest education level: Not on file  Occupational History  . Not on file  Tobacco Use  . Smoking status: Never Smoker  . Smokeless tobacco: Never Used  Substance and Sexual Activity  . Alcohol use: No  . Drug use: No  . Sexual activity: Not on file  Other Topics Concern  . Not on file  Social History Narrative  . Not on file   Social Determinants of Health   Financial Resource Strain: Not on file  Food Insecurity: Not on file  Transportation Needs: Not on file  Physical Activity: Not on file  Stress: Not on file  Social Connections: Not on file  Intimate Partner Violence: Not on file     PHYSICAL EXAM  Vitals:   06/13/20 1557  BP: (!) 143/89  Pulse: 75  Weight: 223 lb (101.2 kg)  Height: 5\' 5"  (1.651 m)    Body mass index is 37.11 kg/m.   General: The patient is an obese woman in no distress.     Neck: She has mild tenderness over the splenius capitis muscles, right greater than left.  Neurologic Exam  Mental status: The patient is alert and  oriented x 3 at the time of the examination. The patient has apparent normal recent and remote memory, with an apparently normal attention span and concentration ability.   Speech is normal.  Cranial nerves: Extraocular movements are full.     Facial strength is normal.  Trapezius and sternocleidomastoid strength is normal.  Motor:  Muscle bulk is normal.   Tone is normal. Strength is  5 / 5 in limbs   Gait and station: Station is normal.   Gait is normal.  Tandem gait is normal.     DIAGNOSTIC DATA (LABS, IMAGING, TESTING) - I reviewed patient records, labs, notes, testing and imaging myself where available.  Lab Results  Component Value Date   WBC 5.9 02/19/2015   HGB 12.7 02/19/2015   HCT 39.4 02/19/2015   MCV 89.5 02/19/2015   PLT 255 02/19/2015        ASSESSMENT AND PLAN    1. Chronic migraine without aura without status migrainosus, not intractable   2. Fibromyalgia   3. Neck pain   4. Depression, unspecified depression type     1.    Botox 190 units as follows:: 95 units into muscles of the face and head as follows: Frontalis (5 units x 2 on the left, 5 units x 2 on the right), procerus and corrugators (5 units x 3), temporalis (5 units x 4 on the left and 5 units x 4 on the right), occipitalis (5 units x 2 on the left and 5 units x 2 on the right) 105 Units into the muscles of the neck as follows: Splenius capitis (12.5 units x 2), Splenius  cervicus (7.5 units x 2), C6-C7 paraspinal (5 units x 2), trapezius (12.5 units x 2); rhomboid 10 U x 2 2.  Continue oxycodone and Cymbalta for fibromyalgia pain.[id number]  The PDMP has been reviewed and she is not getting medications from other doctors.  3.   Stay active and exercise as tolerated. 4.   return in 3 months or sooner if there are new or worsening neurologic symptoms.  Achillies Buehl A. Epimenio Foot, MD, PhD 06/13/2020, 6:10 PM Certified in Neurology, Clinical Neurophysiology, Sleep Medicine, Pain Medicine and Neuroimaging  [facility] [address] [place], [place] [address] [phone]

## 2020-06-29 ENCOUNTER — Other Ambulatory Visit: Payer: Self-pay | Admitting: Neurology

## 2020-07-27 ENCOUNTER — Other Ambulatory Visit: Payer: Self-pay | Admitting: Neurology

## 2020-08-28 ENCOUNTER — Other Ambulatory Visit: Payer: Self-pay | Admitting: Neurology

## 2020-08-29 ENCOUNTER — Telehealth: Payer: Self-pay | Admitting: Neurology

## 2020-08-29 NOTE — Telephone Encounter (Signed)
Called [facility] and spoke with Bridgett. Confirmed they received rx e-scribed by Dr. Epimenio Foot today. They are currently working on it for pt. I called pt and LVM letting her know rx sent to pharmacy and confirmed w/ pharmacy they have script and are working on it for her. Advised her to call back if she has any more questions.

## 2020-08-29 NOTE — Telephone Encounter (Signed)
Pt called stating that she called in her oxyCODONE (ROXICODONE) 15 MG immediate release tablet yesterday and the pharmacy informed her that they have not received it yet. Pt would like to know when her medication will be filled. Please advise.

## 2020-09-09 ENCOUNTER — Other Ambulatory Visit: Payer: Self-pay

## 2020-09-09 ENCOUNTER — Emergency Department (HOSPITAL_BASED_OUTPATIENT_CLINIC_OR_DEPARTMENT_OTHER): Payer: Medicare Other

## 2020-09-09 ENCOUNTER — Encounter (HOSPITAL_BASED_OUTPATIENT_CLINIC_OR_DEPARTMENT_OTHER): Payer: Self-pay | Admitting: Emergency Medicine

## 2020-09-09 ENCOUNTER — Emergency Department (HOSPITAL_BASED_OUTPATIENT_CLINIC_OR_DEPARTMENT_OTHER)
Admission: EM | Admit: 2020-09-09 | Discharge: 2020-09-09 | Disposition: A | Discharge status: Home / Self Care | Payer: Medicare Other | Attending: Emergency Medicine | Admitting: Emergency Medicine

## 2020-09-09 DIAGNOSIS — Z7951 Long term (current) use of inhaled steroids: Secondary | ICD-10-CM | POA: Insufficient documentation

## 2020-09-09 DIAGNOSIS — J4540 Moderate persistent asthma, uncomplicated: Secondary | ICD-10-CM | POA: Insufficient documentation

## 2020-09-09 DIAGNOSIS — I129 Hypertensive chronic kidney disease with stage 1 through stage 4 chronic kidney disease, or unspecified chronic kidney disease: Secondary | ICD-10-CM | POA: Insufficient documentation

## 2020-09-09 DIAGNOSIS — R630 Anorexia: Secondary | ICD-10-CM | POA: Diagnosis not present

## 2020-09-09 DIAGNOSIS — E1122 Type 2 diabetes mellitus with diabetic chronic kidney disease: Secondary | ICD-10-CM | POA: Diagnosis not present

## 2020-09-09 DIAGNOSIS — Z79899 Other long term (current) drug therapy: Secondary | ICD-10-CM | POA: Diagnosis not present

## 2020-09-09 DIAGNOSIS — R109 Unspecified abdominal pain: Secondary | ICD-10-CM | POA: Diagnosis present

## 2020-09-09 DIAGNOSIS — N189 Chronic kidney disease, unspecified: Secondary | ICD-10-CM | POA: Diagnosis not present

## 2020-09-09 LAB — COMPREHENSIVE METABOLIC PANEL
ALT: 17 U/L (ref 0–44)
AST: 66 U/L — ABNORMAL HIGH (ref 15–41)
Albumin: 3.9 g/dL (ref 3.5–5.0)
Alkaline Phosphatase: 89 U/L (ref 38–126)
Anion gap: 10 (ref 5–15)
BUN: 15 mg/dL (ref 6–20)
CO2: 19 mmol/L — ABNORMAL LOW (ref 22–32)
Calcium: 8.8 mg/dL — ABNORMAL LOW (ref 8.9–10.3)
Chloride: 107 mmol/L (ref 98–111)
Creatinine, Ser: 0.74 mg/dL (ref 0.44–1.00)
GFR, Estimated: >60 mL/min (ref >60)
Glucose, Bld: 94 mg/dL (ref 70–99)
Potassium: 3.8 mmol/L (ref 3.5–5.1)
Sodium: 136 mmol/L (ref 135–145)
Total Bilirubin: 0.8 mg/dL (ref 0.3–1.2)
Total Protein: 6.9 g/dL (ref 6.5–8.1)

## 2020-09-09 LAB — CBC WITH DIFFERENTIAL/PLATELET
Abs Immature Granulocytes: 0.01 10*3/uL (ref 0.00–0.07)
Basophils Absolute: 0 10*3/uL (ref 0.0–0.1)
Basophils Relative: 1 %
Eosinophils Absolute: 0.2 10*3/uL (ref 0.0–0.5)
Eosinophils Relative: 5 %
HCT: 41.3 % (ref 36.0–46.0)
Hemoglobin: 13.6 g/dL (ref 12.0–15.0)
Immature Granulocytes: 0 %
Lymphocytes Relative: 48 %
Lymphs Abs: 2.4 10*3/uL (ref 0.7–4.0)
MCH: 29.2 pg (ref 26.0–34.0)
MCHC: 32.9 g/dL (ref 30.0–36.0)
MCV: 88.8 fL (ref 80.0–100.0)
Monocytes Absolute: 0.6 10*3/uL (ref 0.1–1.0)
Monocytes Relative: 12 %
Neutro Abs: 1.7 10*3/uL (ref 1.7–7.7)
Neutrophils Relative %: 34 %
Platelets: 176 10*3/uL (ref 150–400)
RBC: 4.65 MIL/uL (ref 3.87–5.11)
RDW: 14.2 % (ref 11.5–15.5)
WBC: 4.9 10*3/uL (ref 4.0–10.5)
nRBC: 0 % (ref 0.0–0.2)

## 2020-09-09 LAB — D-DIMER, QUANTITATIVE: D-Dimer, Quant: <0.27 ug/mL-FEU (ref 0.00–0.50)

## 2020-09-09 MED ORDER — PREDNISONE 20 MG PO TABS: 40.0000 mg | ORAL_TABLET | Freq: Every day | ORAL | 0 refills | Status: DC

## 2020-09-09 MED ORDER — ONDANSETRON HCL 4 MG/2ML IJ SOLN
4.0000 mg | Freq: Once | INTRAMUSCULAR | Status: AC
  Administered 2020-09-09: 4 mg via INTRAVENOUS
  Filled 2020-09-09: qty 2

## 2020-09-09 MED ORDER — HYDROMORPHONE HCL 1 MG/ML IJ SOLN
1.0000 mg | Freq: Once | INTRAMUSCULAR | Status: AC
Start: 2020-09-09 — End: 2020-09-09
  Administered 2020-09-09: 1 mg via INTRAVENOUS
  Filled 2020-09-09: qty 1

## 2020-09-09 NOTE — ED Provider Notes (Signed)
[facility] EMERGENCY DEPARTMENT Provider Note   CSN: [id number] Arrival date & time: 09/09/20  1106     History Chief Complaint  Patient presents with  . Flank Pain    Connie Arnold is a 55 y.o. female.  Patient is a 55 year old female with a history of lupus, interstitial cystitis, IBS, hypertension, diabetes, chronic kidney disease, pseudotumor cerebri who is presenting today after being seen at urgent care for sudden onset of left flank pain that started yesterday.  Patient reports she was not doing anything unusual when the pain just came on.  It has been searing and severe.  It occasionally shoots down into her abdomen but is stayed mostly in her left flank.  She initially noticed she was urinating a little bit more but denies any dysuria.  Last bowel movement was 2 days ago which she states is a bit unusual but she was able to pass flatus which did not improve the pain.  Yesterday since having the pain she is felt slightly distended with decreased appetite but no nausea or vomiting and eating does not make the pain worse.  The only thing that seems to make the pain worse with certain movements.  She has never had anything like this before.  She denies fever, cough or congestion.  Taking a deep breath does make the pain worse.  She does take chronic oxycodone 10 mg which she is continue to take but it has not affected the pain.  She is unable to take NSAIDs due to chronic kidney disease related to her lupus.  The history is provided by the patient and medical records.  Flank Pain       Past Medical History:  Diagnosis Date  . Asthma   . Carpal tunnel syndrome 09/29/2014  . Chronic back pain   . Chronic kidney disease   . Diabetes mellitus without complication (HCC)   . Fatty infiltration of liver 03/17/2015  . Fibromyalgia   . Hypertension   . IBS (irritable bowel syndrome)   . Interstitial cystitis   . Lupus (HCC)   . Lupus (systemic lupus erythematosus) (HCC)    . Migraines   . Pseudotumor cerebri     Patient Active Problem List   Diagnosis Date Noted  . TIA (transient ischemic attack) 04/15/2016  . Chronic, continuous use of opioids 01/10/2016  . Chronic constipation 06/27/2015  . Headache, migraine 06/27/2015  . Diabetes mellitus without complication (HCC) 06/27/2015  . Benign essential HTN 06/27/2015  . Encounter for immunization 06/27/2015  . Asthma, moderate persistent 06/27/2015  . Avitaminosis D 06/27/2015  . Abnormal finding in urine 06/13/2015  . Medication overuse headache 03/29/2015  . Benign intracranial hypertension 03/29/2015  . Fatty infiltration of liver 03/17/2015  . Insomnia 11/10/2014  . Depression 11/10/2014  . Migraine without aura, intractable 10/24/2014  . Tingling 09/29/2014  . Carpal tunnel syndrome 09/29/2014  . Cervical disc disorder with radiculopathy of cervical region 08/25/2014  . Bursitis, subacromial 08/25/2014  . Idiopathic intracranial hypertension 08/10/2014  . Chronic migraine without aura without status migrainosus, not intractable 08/10/2014  . Neck pain 08/10/2014  . Chronic migraine w/o aura w/o status migrainosus, not intractable 06/01/2014  . Depression, major, severe recurrence (HCC) 11/01/2013  . Severe episode of recurrent major depressive disorder (HCC) 11/01/2013  . Blood in the urine 04/29/2013  . Abnormal presence of protein in urine 04/29/2013  . Chronic interstitial cystitis 11/16/2011  . Infection of urinary tract 11/16/2011  . Fibromyalgia 05/06/2011  .  Other long term (current) drug therapy 05/06/2011  . Systemic lupus erythematosus (HCC) 05/06/2011  . MORBID OBESITY 10/22/2007  . SHORTNESS OF BREATH (SOB) 10/22/2007  . CORONARY HEART DISEASE 10/21/2007  . ALLERGIC RHINITIS 10/21/2007    Past Surgical History:  Procedure Laterality Date  . ABDOMINAL HYSTERECTOMY    . APPENDECTOMY    . CHOLECYSTECTOMY    . CYSTOSCOPY       OB History   No obstetric history on  file.     Family History  Problem Relation Age of Onset  . Graves' disease Mother   . Heart disease Father   . Prostate cancer Father   . Healthy Brother   . Healthy Maternal Aunt   . Healthy Maternal Uncle     Social History   Tobacco Use  . Smoking status: Never Smoker  . Smokeless tobacco: Never Used  Vaping Use  . Vaping Use: Never used  Substance Use Topics  . Alcohol use: No  . Drug use: No    Home Medications Prior to Admission medications   Medication Sig Start Date End Date Taking? Authorizing Provider  ADVAIR DISKUS 250-50 MCG/DOSE AEPB Inhale 1 puff into the lungs as needed.  04/16/15   [provider]  albuterol (PROVENTIL HFA;VENTOLIN HFA) 108 (90 Base) MCG/ACT inhaler Inhale into the lungs. 10/21/16   [provider]  albuterol (PROVENTIL) (2.5 MG/3ML) 0.083% nebulizer solution  07/27/14   [provider]  Azelastine-Fluticasone 137-50 MCG/ACT SUSP Place 1 spray into the nose as needed.     [provider]  baclofen (LIORESAL) 10 MG tablet TAKE 1 TO 2 TABLETS BY  MOUTH 3 TIMES DAILY 11/26/19   Sater, Pearletha Furl, MD  Belimumab 200 MG/ML SOAJ Inject 1 Dose into the skin once a week.     [provider]  bupivacaine (MARCAINE PRESERVATIVE FREE) 0.5 % SOLN injection  02/21/15   [provider]  cetirizine (ZYRTEC) 10 MG tablet Take 10 mg by mouth daily.     [provider]  DULoxetine (CYMBALTA) 60 MG capsule Take 1 capsule (60 mg total) by mouth daily. 03/09/18   Sater, Pearletha Furl, MD  fluconazole (DIFLUCAN) 150 MG tablet Take 150 mg by mouth once.     [provider]  FLUoxetine (PROZAC) 40 MG capsule Take 40 mg by mouth daily.  11/01/16   [provider]  furosemide (LASIX) 40 MG tablet Take 40 mg by mouth daily.  08/05/14   [provider]  glucose blood test strip  11/24/14   [provider]  heparin 54008 UNIT/ML injection Instill 4 cc into bladder 2x daily 11/29/14    [provider]  hydroxychloroquine (PLAQUENIL) 200 MG tablet Take 1 tablet (200 mg total) by mouth 2 (two) times daily. 01/05/14   Teressa Lower, NP  ipratropium-albuterol (DUONEB) 0.5-2.5 (3) MG/3ML SOLN 3 mLs as needed.     [provider]  lactulose, encephalopathy, (CHRONULAC) 10 GM/15ML SOLN daily as needed.  08/26/14   [provider]  montelukast (SINGULAIR) 10 MG tablet Take 10 mg by mouth at bedtime.  11/01/16   [provider]  oxyCODONE (ROXICODONE) 15 MG immediate release tablet TAKE ONE TABLET BY MOUTH EVERY EIGHT HOURS AS NEEDED FOR PAIN 08/29/20   Sater, Pearletha Furl, MD  phentermine 37.5 MG capsule Take 1 capsule by mouth once daily in the morning 03/15/20   Sater, Pearletha Furl, MD  predniSONE (DELTASONE) 5 MG tablet Take 4 tablets for 3 days, 3  tablets for 3 days, 2 tablets for 3 days, 1 tablet for 3 days and then stop. 12/05/19   [provider]  promethazine (PHENERGAN) 50 MG/ML injection Inject into the muscle as needed.  03/03/09   [provider]  topiramate (TOPAMAX) 200 MG tablet TAKE 1 TABLET BY MOUTH  TWICE DAILY 11/26/19   Sater, Pearletha Furl, MD  traZODone (DESYREL) 100 MG tablet Take 1 tablet (100 mg total) by mouth at bedtime. 12/08/19   Sater, Pearletha Furl, MD  triamcinolone (NASACORT) 55 MCG/ACT AERO nasal inhaler Place 2 sprays into the nose.    [provider]  trimethoprim (TRIMPEX) 100 MG tablet Take 1 tablet by mouth  daily 10/27/14   [provider]  Vitamin D, Ergocalciferol, (DRISDOL) 50000 UNITS CAPS capsule Take 50,000 Units by mouth every 7 (seven) days.     [provider]    Allergies    Nsaids, Codeine, Ramipril, and Imitrex [sumatriptan]  Review of Systems   Review of Systems  Genitourinary: Positive for flank pain.  All other systems reviewed and are negative.   Physical Exam Updated Vital Signs BP (!) 149/91 (BP Location: Left Arm)   Pulse 70   Temp 98.5 F (36.9 C) (Oral)    Resp 18   Ht 5\' 5"  (1.651 m)   Wt 94.3 kg   SpO2 99%   BMI 34.61 kg/m   Physical Exam Vitals and nursing note reviewed.  Constitutional:      General: She is not in acute distress.    Appearance: Normal appearance. She is well-developed.  HENT:     Head: Normocephalic and atraumatic.  Eyes:     Pupils: Pupils are equal, round, and reactive to light.  Cardiovascular:     Rate and Rhythm: Normal rate and regular rhythm.     Heart sounds: Normal heart sounds. No murmur heard. No friction rub.  Pulmonary:     Effort: Pulmonary effort is normal.     Breath sounds: Normal breath sounds. No wheezing or rales.  Abdominal:     General: Bowel sounds are normal. There is no distension.     Palpations: Abdomen is soft.     Tenderness: There is no abdominal tenderness. There is no right CVA tenderness, left CVA tenderness, guarding or rebound.  Musculoskeletal:        General: No tenderness. Normal range of motion.     Comments: No edema  Skin:    General: Skin is warm and dry.     Findings: No rash.     Comments: No rashes to the skin  Neurological:     Mental Status: She is alert and oriented to person, place, and time. Mental status is at baseline.     Cranial Nerves: No cranial nerve deficit.  Psychiatric:        Mood and Affect: Mood normal.        Behavior: Behavior normal.     ED Results / Procedures / Treatments   Labs (all labs ordered are listed, but only abnormal results are displayed) Labs Reviewed  COMPREHENSIVE METABOLIC PANEL - Abnormal; Notable for the following components:      Result Value   CO2 19 (*)    Calcium 8.8 (*)    AST 66 (*)    All other components within normal limits  CBC WITH DIFFERENTIAL/PLATELET  D-DIMER, QUANTITATIVE    EKG None  Radiology CT Renal Stone Study  Result Date: 09/09/2020 CLINICAL DATA:  Left-sided flank pain beginning  yesterday. EXAM: CT ABDOMEN AND PELVIS WITHOUT CONTRAST TECHNIQUE: Multidetector CT imaging of the  abdomen and pelvis was performed following the standard protocol without IV contrast. COMPARISON:  04/01/2011 from [facility] FINDINGS: Lower chest: No acute findings. Hepatobiliary: No mass visualized on this unenhanced exam. Prior cholecystectomy. No evidence of biliary obstruction. Pancreas: No mass or inflammatory process visualized on this unenhanced exam. Spleen:  Within normal limits in size. Adrenals/Urinary tract: No evidence of urolithiasis or hydronephrosis. Unremarkable unopacified urinary bladder. Stomach/Bowel: No evidence of obstruction, inflammatory process, or abnormal fluid collections. Vascular/Lymphatic: No pathologically enlarged lymph nodes identified. No evidence of abdominal aortic aneurysm. Reproductive: Prior hysterectomy noted. Adnexal regions are unremarkable in appearance. Other:  None. Musculoskeletal:  No suspicious bone lesions identified. IMPRESSION: No evidence of urolithiasis, hydronephrosis, or other acute findings. Electronically Signed   By: Danae Orleans M.D.   On: 09/09/2020 12:36    Procedures Procedures   Medications Ordered in ED Medications  HYDROmorphone (DILAUDID) injection 1 mg (has no administration in time range)  ondansetron (ZOFRAN) injection 4 mg (4 mg Intravenous Given 09/09/20 1156)    ED Course  I have reviewed the triage vital signs and the nursing notes.  Pertinent labs & imaging results that were available during my care of the patient were reviewed by me and considered in my medical decision making (see chart for details).    MDM Rules/Calculators/A&P                          Pt with symptoms consistent with kidney stone with pain in the Left flank that started suddenly yesterday.  Pain is present with deep breathing so cannot definitely r/o PE as pt does have risk factors such as lupus.  At urgent care today pt had UA with trace blood but no signs of infection.  Low concern for diverticulitis and no risk factors or history  suggestive of AAA.  No hx suggestive of GU source (discharge).  Pt has had prior SBO and multiple abd surgeries in the past and has not had BM for 2 days but feel less likely as pt is still eating and drinking and has had no vomiting.  Will treat pain and ensure no change in kidney function CBC, CMP and will get stone study to further eval.  12:50 PM Patient's labs and renal CT are negative for acute pathology.  Creatinine is normal at 0.74.  Minimal AST elevation of 66.  No evidence of bowel obstruction, renal stone or other abnormality.  Will evaluate D-dimer to ensure we do not need to worry about PE.  Final Clinical Impression(s) / ED Diagnoses Final diagnoses:  Left flank pain    Rx / DC Orders ED Discharge Orders         Ordered    predniSONE (DELTASONE) 20 MG tablet  Daily        09/09/20 1337           Gwyneth Sprout, MD 09/09/20 1345

## 2020-09-09 NOTE — ED Triage Notes (Signed)
Pt c/o left sided flank pain onset yesterday. Pt reports increased urination as well. Pt seen at urgent care on [address] and was told to come here for CT scan to r/o kidney stone.

## 2020-09-09 NOTE — Discharge Instructions (Addendum)
The CT scan, blood work and Urine all look normal.  Continue taking your regular medications.  We will try steroids to see if that helps with the pain.  Thinking this is most likely something with your back.  However if you develop vomiting, fever or changes in symptoms please return for evaluation.

## 2020-09-12 ENCOUNTER — Encounter: Payer: Self-pay | Admitting: Neurology

## 2020-09-12 ENCOUNTER — Ambulatory Visit: Payer: Medicare Other | Admitting: Neurology

## 2020-09-12 ENCOUNTER — Other Ambulatory Visit: Payer: Self-pay

## 2020-09-12 VITALS — BP 130/80 | HR 72 | Ht 65.0 in | Wt 214.5 lb

## 2020-09-12 DIAGNOSIS — M797 Fibromyalgia: Secondary | ICD-10-CM | POA: Diagnosis not present

## 2020-09-12 DIAGNOSIS — G43709 Chronic migraine without aura, not intractable, without status migrainosus: Secondary | ICD-10-CM | POA: Diagnosis not present

## 2020-09-12 DIAGNOSIS — M542 Cervicalgia: Secondary | ICD-10-CM

## 2020-09-12 MED ORDER — PHENTERMINE HCL 37.5 MG PO CAPS: ORAL_CAPSULE | ORAL | 5 refills | Status: DC

## 2020-09-12 NOTE — Progress Notes (Signed)
[facility]  PATIENT: Connie Arnold DOB: [birth year]    HISTORICAL  CHIEF COMPLAINT:  Chief Complaint  Patient presents with  . Botulinum Toxin Injection    RM 12, alone. Here for botox for migraines. 200Ux1vial. Lot: [id number]. Exp: 11/24. NDC: [id number].     HISTORY OF PRESENT ILLNESS:  Connie Arnold is a 55 y.o. woman with chronic migraine headaches and other neurologic issues.    Update 09/12/2020: She has had about 10 migraines the last 30 days, mostly the last 2 weeks,  but did better the first 2 months after the Botox with just a couple .She has tolerated the injections well.  She is also on topiramate to help with her chronic migraines.  HA's are pounding and associated with photophobia, phonophobia and nausea.    Fibromyalgia pain is better since being on a steroid for her back.   She finds more benefit from oxycodone than other medications.   She   tolerates it well.    She has not escalated and is not getting opiates from other doctors. PDMP Database has been reviewed.  She is also on Cymbalta for the FMS.  She was exercising at the gym but due to increased Covid cases she has not gone in 4 months  She has sleep maintenance greater than sleep onset insomnia.  She has had depression but feels this is stable.     ------------ Chronic Migraine History:   She has had chronic migraine headache with moderate pain, flaring up to severe,  for most of the past couple of years   She is having chronic migraine 30/30 days every month, lasting for more than 4 hours every day.  She has received some benefit from occipital nerve blocks, trigger point injections and medications but the headaches still persist on a daily basis for past couple years.    She had been on Botox for migraine prophylaxis in the past with benefit. However, with changes in insurance she was unable to continue due to very high co-pay. She is currently on Topamax 200 mg by mouth twice a day and  muscle relaxants and opiates.  She has occasional emergency room visits and frequent additional visits to my office for trigger point injections..  She has tried and failed multiple prophylactic medications for her chronic migraine: Antiepileptics:    zonisamide, topiramate, Keppra.   (Due to obesity, Depakote is a poor choice) Anti-depressants  amitriptyline, imipramine Anti-hypertensives:  Diamox Muscle relaxants:  baclofen, tizanidine NSAID's:  She is unable to tolerate chronic anti-inflammatories due to mild renal insufficiency.  She has had multiple visits to ER and to our office for injections when pain is more severe.     SLE:   She see Dr. Nydia Bouton at [facility].   She was diagnosed with SLE in 2006 and has been on Plaquenil x years and more recently Benlysta.    Her Complements were normal 11/17 but ANA is positive (speckled)   ALLERGIES: Allergies  Allergen Reactions  . Nsaids Other (See Comments)    Chronic kidney failure  . Codeine   . Ramipril   . Imitrex [Sumatriptan] Nausea And Vomiting    HOME MEDICATIONS:  Current Outpatient Medications:  .  ADVAIR DISKUS 250-50 MCG/DOSE AEPB, Inhale 1 puff into the lungs as needed. , Disp: , Rfl:  .  albuterol (PROVENTIL HFA;VENTOLIN HFA) 108 (90 Base) MCG/ACT inhaler, Inhale into the lungs., Disp: , Rfl:  .  albuterol (PROVENTIL) (2.5 MG/3ML) 0.083% nebulizer solution, , Disp: ,  Rfl:  .  Azelastine-Fluticasone 137-50 MCG/ACT SUSP, Place 1 spray into the nose as needed. , Disp: , Rfl:  .  baclofen (LIORESAL) 10 MG tablet, TAKE 1 TO 2 TABLETS BY  MOUTH 3 TIMES DAILY, Disp: 540 tablet, Rfl: 3 .  Belimumab 200 MG/ML SOAJ, Inject 1 Dose into the skin once a week. , Disp: , Rfl:  .  bupivacaine (MARCAINE PRESERVATIVE FREE) 0.5 % SOLN injection, , Disp: , Rfl:  .  cetirizine (ZYRTEC) 10 MG tablet, Take 10 mg by mouth daily. , Disp: , Rfl:  .  DULoxetine (CYMBALTA) 60 MG capsule, Take 1 capsule (60 mg total) by mouth daily., Disp: 90 capsule, Rfl:  4 .  fluconazole (DIFLUCAN) 150 MG tablet, Take 150 mg by mouth once. , Disp: , Rfl:  .  FLUoxetine (PROZAC) 40 MG capsule, Take 40 mg by mouth daily. , Disp: , Rfl:  .  furosemide (LASIX) 40 MG tablet, Take 40 mg by mouth daily. , Disp: , Rfl:  .  glucose blood test strip, , Disp: , Rfl:  .  heparin 05397 UNIT/ML injection, Instill 4 cc into bladder 2x daily, Disp: , Rfl:  .  hydroxychloroquine (PLAQUENIL) 200 MG tablet, Take 1 tablet (200 mg total) by mouth 2 (two) times daily., Disp: 30 tablet, Rfl: 0 .  ipratropium-albuterol (DUONEB) 0.5-2.5 (3) MG/3ML SOLN, 3 mLs as needed. , Disp: , Rfl:  .  lactulose, encephalopathy, (CHRONULAC) 10 GM/15ML SOLN, daily as needed. , Disp: , Rfl:  .  montelukast (SINGULAIR) 10 MG tablet, Take 10 mg by mouth at bedtime. , Disp: , Rfl:  .  oxyCODONE (ROXICODONE) 15 MG immediate release tablet, TAKE ONE TABLET BY MOUTH EVERY EIGHT HOURS AS NEEDED FOR PAIN, Disp: 90 tablet, Rfl: 0 .  predniSONE (DELTASONE) 20 MG tablet, Take 2 tablets (40 mg total) by mouth daily., Disp: 10 tablet, Rfl: 0 .  promethazine (PHENERGAN) 50 MG/ML injection, Inject into the muscle as needed. , Disp: , Rfl:  .  topiramate (TOPAMAX) 200 MG tablet, TAKE 1 TABLET BY MOUTH  TWICE DAILY, Disp: 180 tablet, Rfl: 3 .  traZODone (DESYREL) 100 MG tablet, Take 1 tablet (100 mg total) by mouth at bedtime., Disp: 90 tablet, Rfl: 3 .  triamcinolone (NASACORT) 55 MCG/ACT AERO nasal inhaler, Place 2 sprays into the nose., Disp: , Rfl:  .  trimethoprim (TRIMPEX) 100 MG tablet, Take 1 tablet by mouth  daily, Disp: , Rfl:  .  Vitamin D, Ergocalciferol, (DRISDOL) 50000 UNITS CAPS capsule, Take 50,000 Units by mouth every 7 (seven) days. , Disp: , Rfl:  .  phentermine 37.5 MG capsule, Take 1 capsule by mouth once daily in the morning, Disp: 30 capsule, Rfl: 5  PAST MEDICAL HISTORY: Past Medical History:  Diagnosis Date  . Asthma   . Carpal tunnel syndrome 09/29/2014  . Chronic back pain   . Chronic  kidney disease   . Diabetes mellitus without complication (HCC)   . Fatty infiltration of liver 03/17/2015  . Fibromyalgia   . Hypertension   . IBS (irritable bowel syndrome)   . Interstitial cystitis   . Lupus (HCC)   . Lupus (systemic lupus erythematosus) (HCC)   . Migraines   . Pseudotumor cerebri     PAST SURGICAL HISTORY: Past Surgical History:  Procedure Laterality Date  . ABDOMINAL HYSTERECTOMY    . APPENDECTOMY    . CHOLECYSTECTOMY    . CYSTOSCOPY      FAMILY HISTORY: Family History  Problem Relation  Age of Onset  . Graves' disease Mother   . Heart disease Father   . Prostate cancer Father   . Healthy Brother   . Healthy Maternal Aunt   . Healthy Maternal Uncle     SOCIAL HISTORY:  Social History   Socioeconomic History  . Marital status: Married    Spouse name: Not on file  . Number of children: Not on file  . Years of education: Not on file  . Highest education level: Not on file  Occupational History  . Not on file  Tobacco Use  . Smoking status: Never Smoker  . Smokeless tobacco: Never Used  Vaping Use  . Vaping Use: Never used  Substance and Sexual Activity  . Alcohol use: No  . Drug use: No  . Sexual activity: Not on file  Other Topics Concern  . Not on file  Social History Narrative  . Not on file   Social Determinants of Health   Financial Resource Strain: Not on file  Food Insecurity: Not on file  Transportation Needs: Not on file  Physical Activity: Not on file  Stress: Not on file  Social Connections: Not on file  Intimate Partner Violence: Not on file     PHYSICAL EXAM  Vitals:   09/12/20 1141  BP: 130/80  Pulse: 72  Weight: 214 lb 8 oz (97.3 kg)  Height: 5\' 5"  (1.651 m)    Body mass index is 35.69 kg/m.   General: The patient is an obese woman in no distress.     Neck: She has mild tenderness over the splenius capitis muscles, right greater than left.  Neurologic Exam  Mental status: The patient is alert  and oriented x 3 at the time of the examination. The patient has apparent normal recent and remote memory, with an apparently normal attention span and concentration ability.   Speech is normal.  Cranial nerves: Extraocular movements are full.     Facial strength is normal.  Trapezius and sternocleidomastoid strength is normal.  Motor:  Muscle bulk is normal.   Tone is normal. Strength is  5 / 5 in limbs   Gait and station: Station is normal.   Gait is normal.  Tandem gait is normal.     DIAGNOSTIC DATA (LABS, IMAGING, TESTING) - I reviewed patient records, labs, notes, testing and imaging myself where available.  Lab Results  Component Value Date   WBC 4.9 09/09/2020   HGB 13.6 09/09/2020   HCT 41.3 09/09/2020   MCV 88.8 09/09/2020   PLT 176 09/09/2020        ASSESSMENT AND PLAN    1. Chronic migraine without aura without status migrainosus, not intractable   2. Fibromyalgia   3. Neck pain     1.    Botox 190 units as follows:: 95 units into muscles of the face and head as follows: Frontalis (5 units x 2 on the left, 5 units x 2 on the right), procerus and corrugators (5 units x 3), temporalis (5 units x 4 on the left and 5 units x 4 on the right), occipitalis (5 units x 2 on the left and 5 units x 2 on the right) 105 Units into the muscles of the neck as follows: Splenius capitis (12.5 units x 2), Splenius  cervicus (7.5 units x 2), C6-C7 paraspinal (5 units x 2), trapezius (12.5 units x 2); rhomboid 10 U x 2 2.  Continue oxycodone and Cymbalta for fibromyalgia pain..  The PDMP has  been reviewed and she is not getting medications from other doctors.  3.   Stay active and exercise as tolerated. 4.   return in 3 months or sooner if there are new or worsening neurologic symptoms.  Connie Arnold A. Epimenio Foot, MD, PhD 09/12/2020, 11:21 PM Certified in Neurology, Clinical Neurophysiology, Sleep Medicine, Pain Medicine and Neuroimaging  [facility] [address]  [address] [place], [place] [address] [phone]    [facility]  PATIENT: Connie Arnold DOB: 24-Aug-1965    HISTORICAL  CHIEF COMPLAINT:  Chief Complaint  Patient presents with  . Botulinum Toxin Injection    RM 12, alone. Here for botox for migraines. 200Ux1vial. Lot: [id number]. Exp: 11/24. NDC: [id number].     HISTORY OF PRESENT ILLNESS:  Connie Arnold is a 55 y.o. woman with chronic migraine headaches and other neurologic issues.    Update 03/15/2020: She has had about 15 migraines the last 30 days but did better the first 2  Months of the botox cycle.  Her last injections were 03/15/2020.  She has tolerated the injections well.  She is also on topiramate to help with her chronic migraines.  HA's are pounding and associated with photophobia, phonophobia and nausea.    Fibromyalgia pain is a little worse and she notes being less active.    She finds more benefit from oxycodone than other medications.   She   tolerates it well.    She has not escalated and is not getting opiates from other doctors. PDMP Database has been reviewed.  She is also on Cymbalta for the FMS.  She was exercising at the gym but due to increased Covid cases she has not gone in 4 months  She has sleep maintenance greater than sleep onset insomnia.  She has had depression but feels this is stable.     ------------ Chronic Migraine History:   She has had chronic migraine headache with moderate pain, flaring up to severe,  for most of the past couple of years   She is having chronic migraine 30/30 days every month, lasting for more than 4 hours every day.  She has received some benefit from occipital nerve blocks, trigger point injections and medications but the headaches still persist on a daily basis for past couple years.    She had been on Botox for migraine prophylaxis in the past with benefit. However, with changes in insurance she was unable to continue due to very high co-pay. She is  currently on Topamax 200 mg by mouth twice a day and muscle relaxants and opiates.  She has occasional emergency room visits and frequent additional visits to my office for trigger point injections..  She has tried and failed multiple prophylactic medications for her chronic migraine: Antiepileptics:    zonisamide, topiramate, Keppra.   (Due to obesity, Depakote is a poor choice) Anti-depressants  amitriptyline, imipramine Anti-hypertensives:  Diamox Muscle relaxants:  baclofen, tizanidine NSAID's:  She is unable to tolerate chronic anti-inflammatories due to mild renal insufficiency.  She has had multiple visits to ER and to our office for injections when pain is more severe.     SLE:   She see Dr. Nydia Bouton at [facility].   She was diagnosed with SLE in 2006 and has been on Plaquenil x years and more recently Benlysta.    Her Complements were normal 11/17 but ANA is positive (speckled)   ALLERGIES: Allergies  Allergen Reactions  . Nsaids Other (See Comments)    Chronic kidney failure  .  Codeine   . Ramipril   . Imitrex [Sumatriptan] Nausea And Vomiting    HOME MEDICATIONS:  Current Outpatient Medications:  .  ADVAIR DISKUS 250-50 MCG/DOSE AEPB, Inhale 1 puff into the lungs as needed. , Disp: , Rfl:  .  albuterol (PROVENTIL HFA;VENTOLIN HFA) 108 (90 Base) MCG/ACT inhaler, Inhale into the lungs., Disp: , Rfl:  .  albuterol (PROVENTIL) (2.5 MG/3ML) 0.083% nebulizer solution, , Disp: , Rfl:  .  Azelastine-Fluticasone 137-50 MCG/ACT SUSP, Place 1 spray into the nose as needed. , Disp: , Rfl:  .  baclofen (LIORESAL) 10 MG tablet, TAKE 1 TO 2 TABLETS BY  MOUTH 3 TIMES DAILY, Disp: 540 tablet, Rfl: 3 .  Belimumab 200 MG/ML SOAJ, Inject 1 Dose into the skin once a week. , Disp: , Rfl:  .  bupivacaine (MARCAINE PRESERVATIVE FREE) 0.5 % SOLN injection, , Disp: , Rfl:  .  cetirizine (ZYRTEC) 10 MG tablet, Take 10 mg by mouth daily. , Disp: , Rfl:  .  DULoxetine (CYMBALTA) 60 MG capsule, Take 1 capsule  (60 mg total) by mouth daily., Disp: 90 capsule, Rfl: 4 .  fluconazole (DIFLUCAN) 150 MG tablet, Take 150 mg by mouth once. , Disp: , Rfl:  .  FLUoxetine (PROZAC) 40 MG capsule, Take 40 mg by mouth daily. , Disp: , Rfl:  .  furosemide (LASIX) 40 MG tablet, Take 40 mg by mouth daily. , Disp: , Rfl:  .  glucose blood test strip, , Disp: , Rfl:  .  heparin 33825 UNIT/ML injection, Instill 4 cc into bladder 2x daily, Disp: , Rfl:  .  hydroxychloroquine (PLAQUENIL) 200 MG tablet, Take 1 tablet (200 mg total) by mouth 2 (two) times daily., Disp: 30 tablet, Rfl: 0 .  ipratropium-albuterol (DUONEB) 0.5-2.5 (3) MG/3ML SOLN, 3 mLs as needed. , Disp: , Rfl:  .  lactulose, encephalopathy, (CHRONULAC) 10 GM/15ML SOLN, daily as needed. , Disp: , Rfl:  .  montelukast (SINGULAIR) 10 MG tablet, Take 10 mg by mouth at bedtime. , Disp: , Rfl:  .  oxyCODONE (ROXICODONE) 15 MG immediate release tablet, TAKE ONE TABLET BY MOUTH EVERY EIGHT HOURS AS NEEDED FOR PAIN, Disp: 90 tablet, Rfl: 0 .  predniSONE (DELTASONE) 20 MG tablet, Take 2 tablets (40 mg total) by mouth daily., Disp: 10 tablet, Rfl: 0 .  promethazine (PHENERGAN) 50 MG/ML injection, Inject into the muscle as needed. , Disp: , Rfl:  .  topiramate (TOPAMAX) 200 MG tablet, TAKE 1 TABLET BY MOUTH  TWICE DAILY, Disp: 180 tablet, Rfl: 3 .  traZODone (DESYREL) 100 MG tablet, Take 1 tablet (100 mg total) by mouth at bedtime., Disp: 90 tablet, Rfl: 3 .  triamcinolone (NASACORT) 55 MCG/ACT AERO nasal inhaler, Place 2 sprays into the nose., Disp: , Rfl:  .  trimethoprim (TRIMPEX) 100 MG tablet, Take 1 tablet by mouth  daily, Disp: , Rfl:  .  Vitamin D, Ergocalciferol, (DRISDOL) 50000 UNITS CAPS capsule, Take 50,000 Units by mouth every 7 (seven) days. , Disp: , Rfl:  .  phentermine 37.5 MG capsule, Take 1 capsule by mouth once daily in the morning, Disp: 30 capsule, Rfl: 5  PAST MEDICAL HISTORY: Past Medical History:  Diagnosis Date  . Asthma   . Carpal tunnel  syndrome 09/29/2014  . Chronic back pain   . Chronic kidney disease   . Diabetes mellitus without complication (HCC)   . Fatty infiltration of liver 03/17/2015  . Fibromyalgia   . Hypertension   . IBS (irritable  bowel syndrome)   . Interstitial cystitis   . Lupus (HCC)   . Lupus (systemic lupus erythematosus) (HCC)   . Migraines   . Pseudotumor cerebri     PAST SURGICAL HISTORY: Past Surgical History:  Procedure Laterality Date  . ABDOMINAL HYSTERECTOMY    . APPENDECTOMY    . CHOLECYSTECTOMY    . CYSTOSCOPY      FAMILY HISTORY: Family History  Problem Relation Age of Onset  . Graves' disease Mother   . Heart disease Father   . Prostate cancer Father   . Healthy Brother   . Healthy Maternal Aunt   . Healthy Maternal Uncle     SOCIAL HISTORY:  Social History   Socioeconomic History  . Marital status: Married    Spouse name: Not on file  . Number of children: Not on file  . Years of education: Not on file  . Highest education level: Not on file  Occupational History  . Not on file  Tobacco Use  . Smoking status: Never Smoker  . Smokeless tobacco: Never Used  Vaping Use  . Vaping Use: Never used  Substance and Sexual Activity  . Alcohol use: No  . Drug use: No  . Sexual activity: Not on file  Other Topics Concern  . Not on file  Social History Narrative  . Not on file   Social Determinants of Health   Financial Resource Strain: Not on file  Food Insecurity: Not on file  Transportation Needs: Not on file  Physical Activity: Not on file  Stress: Not on file  Social Connections: Not on file  Intimate Partner Violence: Not on file     PHYSICAL EXAM  Vitals:   09/12/20 1141  BP: 130/80  Pulse: 72  Weight: 214 lb 8 oz (97.3 kg)  Height: 5\' 5"  (1.651 m)    Body mass index is 35.69 kg/m.   General: The patient is an obese woman in no distress.     Neck: She has mild tenderness over the splenius capitis muscles, right greater than  left.  Neurologic Exam  Mental status: The patient is alert and oriented x 3 at the time of the examination. The patient has apparent normal recent and remote memory, with an apparently normal attention span and concentration ability.   Speech is normal.  Cranial nerves: Extraocular movements are full.     Facial strength is normal.  Trapezius and sternocleidomastoid strength is normal.  Motor:  Muscle bulk is normal.   Tone is normal. Strength is  5 / 5 in limbs   Gait and station: Station is normal.   Gait is normal.  Tandem gait is normal.     DIAGNOSTIC DATA (LABS, IMAGING, TESTING) - I reviewed patient records, labs, notes, testing and imaging myself where available.  Lab Results  Component Value Date   WBC 4.9 09/09/2020   HGB 13.6 09/09/2020   HCT 41.3 09/09/2020   MCV 88.8 09/09/2020   PLT 176 09/09/2020        ASSESSMENT AND PLAN    1. Chronic migraine without aura without status migrainosus, not intractable   2. Fibromyalgia   3. Neck pain     1.    Botox 190 units as follows:: 95 units into muscles of the face and head as follows: Frontalis (5 units x 2 on the left, 5 units x 2 on the right), procerus and corrugators (5 units x 3), temporalis (5 units x 4 on the left and 5  units x 4 on the right), occipitalis (5 units x 2 on the left and 5 units x 2 on the right) 105 Units into the muscles of the neck as follows: Splenius capitis (12.5 units x 2), Splenius  cervicus (7.5 units x 2), C6-C7 paraspinal (5 units x 2), trapezius (12.5 units x 2); rhomboid 10 U x 2 2.  Continue oxycodone and Cymbalta for fibromyalgia pain..  The PDMP has been reviewed and she is not getting medications from other doctors.  3.   Stay active and exercise as tolerated. 4.   return in 3 months or sooner if there are new or worsening neurologic symptoms.  Charlean Carneal A. Epimenio Foot, MD, PhD 09/12/2020, 11:21 PM Certified in Neurology, Clinical Neurophysiology, Sleep Medicine, Pain Medicine and  Neuroimaging  [facility] [address], [address] [place], [place] [address] [phone]    [facility]  PATIENT: Connie Arnold DOB: 07-08-1965    HISTORICAL  CHIEF COMPLAINT:  Chief Complaint  Patient presents with  . Botulinum Toxin Injection    RM 12, alone. Here for botox for migraines. 200Ux1vial. Lot: [id number]. Exp: 11/24. NDC: [id number].     HISTORY OF PRESENT ILLNESS:  Connie Arnold is a 55 y.o. woman with chronic migraine headaches and other neurologic issues.    Update 03/15/2020: She has had about 15 migraines the last 30 days but did better the first 2  Months of the botox cycle.  Her last injections were 03/15/2020.  She has tolerated the injections well.  She is also on topiramate to help with her chronic migraines.  HA's are pounding and associated with photophobia, phonophobia and nausea.    Fibromyalgia pain is a little worse and she notes being less active.    She finds more benefit from oxycodone than other medications.   She   tolerates it well.    She has not escalated and is not getting opiates from other doctors. PDMP Database has been reviewed.  She is also on Cymbalta for the FMS.  She was exercising at the gym but due to increased Covid cases she has not gone in 4 months  She has sleep maintenance greater than sleep onset insomnia.  She has had depression but feels this is stable.     ------------ Chronic Migraine History:   She has had chronic migraine headache with moderate pain, flaring up to severe,  for most of the past couple of years   She is having chronic migraine 30/30 days every month, lasting for more than 4 hours every day.  She has received some benefit from occipital nerve blocks, trigger point injections and medications but the headaches still persist on a daily basis for past couple years.    She had been on Botox for migraine prophylaxis in the past with benefit. However, with changes in  insurance she was unable to continue due to very high co-pay. She is currently on Topamax 200 mg by mouth twice a day and muscle relaxants and opiates.  She has occasional emergency room visits and frequent additional visits to my office for trigger point injections..  She has tried and failed multiple prophylactic medications for her chronic migraine: Antiepileptics:    zonisamide, topiramate, Keppra.   (Due to obesity, Depakote is a poor choice) Anti-depressants  amitriptyline, imipramine Anti-hypertensives:  Diamox Muscle relaxants:  baclofen, tizanidine NSAID's:  She is unable to tolerate chronic anti-inflammatories due to mild renal insufficiency.  She has had multiple visits to ER and to  our office for injections when pain is more severe.     SLE:   She see Dr. Nydia Bouton at [facility].   She was diagnosed with SLE in 2006 and has been on Plaquenil x years and more recently Benlysta.    Her Complements were normal 11/17 but ANA is positive (speckled)   ALLERGIES: Allergies  Allergen Reactions  . Nsaids Other (See Comments)    Chronic kidney failure  . Codeine   . Ramipril   . Imitrex [Sumatriptan] Nausea And Vomiting    HOME MEDICATIONS:  Current Outpatient Medications:  .  ADVAIR DISKUS 250-50 MCG/DOSE AEPB, Inhale 1 puff into the lungs as needed. , Disp: , Rfl:  .  albuterol (PROVENTIL HFA;VENTOLIN HFA) 108 (90 Base) MCG/ACT inhaler, Inhale into the lungs., Disp: , Rfl:  .  albuterol (PROVENTIL) (2.5 MG/3ML) 0.083% nebulizer solution, , Disp: , Rfl:  .  Azelastine-Fluticasone 137-50 MCG/ACT SUSP, Place 1 spray into the nose as needed. , Disp: , Rfl:  .  baclofen (LIORESAL) 10 MG tablet, TAKE 1 TO 2 TABLETS BY  MOUTH 3 TIMES DAILY, Disp: 540 tablet, Rfl: 3 .  Belimumab 200 MG/ML SOAJ, Inject 1 Dose into the skin once a week. , Disp: , Rfl:  .  bupivacaine (MARCAINE PRESERVATIVE FREE) 0.5 % SOLN injection, , Disp: , Rfl:  .  cetirizine (ZYRTEC) 10 MG tablet, Take 10 mg by mouth daily. ,  Disp: , Rfl:  .  DULoxetine (CYMBALTA) 60 MG capsule, Take 1 capsule (60 mg total) by mouth daily., Disp: 90 capsule, Rfl: 4 .  fluconazole (DIFLUCAN) 150 MG tablet, Take 150 mg by mouth once. , Disp: , Rfl:  .  FLUoxetine (PROZAC) 40 MG capsule, Take 40 mg by mouth daily. , Disp: , Rfl:  .  furosemide (LASIX) 40 MG tablet, Take 40 mg by mouth daily. , Disp: , Rfl:  .  glucose blood test strip, , Disp: , Rfl:  .  heparin 40981 UNIT/ML injection, Instill 4 cc into bladder 2x daily, Disp: , Rfl:  .  hydroxychloroquine (PLAQUENIL) 200 MG tablet, Take 1 tablet (200 mg total) by mouth 2 (two) times daily., Disp: 30 tablet, Rfl: 0 .  ipratropium-albuterol (DUONEB) 0.5-2.5 (3) MG/3ML SOLN, 3 mLs as needed. , Disp: , Rfl:  .  lactulose, encephalopathy, (CHRONULAC) 10 GM/15ML SOLN, daily as needed. , Disp: , Rfl:  .  montelukast (SINGULAIR) 10 MG tablet, Take 10 mg by mouth at bedtime. , Disp: , Rfl:  .  oxyCODONE (ROXICODONE) 15 MG immediate release tablet, TAKE ONE TABLET BY MOUTH EVERY EIGHT HOURS AS NEEDED FOR PAIN, Disp: 90 tablet, Rfl: 0 .  predniSONE (DELTASONE) 20 MG tablet, Take 2 tablets (40 mg total) by mouth daily., Disp: 10 tablet, Rfl: 0 .  promethazine (PHENERGAN) 50 MG/ML injection, Inject into the muscle as needed. , Disp: , Rfl:  .  topiramate (TOPAMAX) 200 MG tablet, TAKE 1 TABLET BY MOUTH  TWICE DAILY, Disp: 180 tablet, Rfl: 3 .  traZODone (DESYREL) 100 MG tablet, Take 1 tablet (100 mg total) by mouth at bedtime., Disp: 90 tablet, Rfl: 3 .  triamcinolone (NASACORT) 55 MCG/ACT AERO nasal inhaler, Place 2 sprays into the nose., Disp: , Rfl:  .  trimethoprim (TRIMPEX) 100 MG tablet, Take 1 tablet by mouth  daily, Disp: , Rfl:  .  Vitamin D, Ergocalciferol, (DRISDOL) 50000 UNITS CAPS capsule, Take 50,000 Units by mouth every 7 (seven) days. , Disp: , Rfl:  .  phentermine 37.5 MG  capsule, Take 1 capsule by mouth once daily in the morning, Disp: 30 capsule, Rfl: 5  PAST MEDICAL  HISTORY: Past Medical History:  Diagnosis Date  . Asthma   . Carpal tunnel syndrome 09/29/2014  . Chronic back pain   . Chronic kidney disease   . Diabetes mellitus without complication (HCC)   . Fatty infiltration of liver 03/17/2015  . Fibromyalgia   . Hypertension   . IBS (irritable bowel syndrome)   . Interstitial cystitis   . Lupus (HCC)   . Lupus (systemic lupus erythematosus) (HCC)   . Migraines   . Pseudotumor cerebri     PAST SURGICAL HISTORY: Past Surgical History:  Procedure Laterality Date  . ABDOMINAL HYSTERECTOMY    . APPENDECTOMY    . CHOLECYSTECTOMY    . CYSTOSCOPY      FAMILY HISTORY: Family History  Problem Relation Age of Onset  . Graves' disease Mother   . Heart disease Father   . Prostate cancer Father   . Healthy Brother   . Healthy Maternal Aunt   . Healthy Maternal Uncle     SOCIAL HISTORY:  Social History   Socioeconomic History  . Marital status: Married    Spouse name: Not on file  . Number of children: Not on file  . Years of education: Not on file  . Highest education level: Not on file  Occupational History  . Not on file  Tobacco Use  . Smoking status: Never Smoker  . Smokeless tobacco: Never Used  Vaping Use  . Vaping Use: Never used  Substance and Sexual Activity  . Alcohol use: No  . Drug use: No  . Sexual activity: Not on file  Other Topics Concern  . Not on file  Social History Narrative  . Not on file   Social Determinants of Health   Financial Resource Strain: Not on file  Food Insecurity: Not on file  Transportation Needs: Not on file  Physical Activity: Not on file  Stress: Not on file  Social Connections: Not on file  Intimate Partner Violence: Not on file     PHYSICAL EXAM  Vitals:   09/12/20 1141  BP: 130/80  Pulse: 72  Weight: 214 lb 8 oz (97.3 kg)  Height: 5\' 5"  (1.651 m)    Body mass index is 35.69 kg/m.   General: The patient is an obese woman in no distress.     Neck: She has  mild tenderness over the splenius capitis muscles, right greater than left.  Neurologic Exam  Mental status: The patient is alert and oriented x 3 at the time of the examination. The patient has apparent normal recent and remote memory, with an apparently normal attention span and concentration ability.   Speech is normal.  Cranial nerves: Extraocular movements are full.  No ptosis.    Facial strength is normal.  Trapezius and sternocleidomastoid strength is normal.  Motor:  Muscle bulk is normal.   Tone is normal. Strength is  5 / 5 in limbs   Gait and station: Station is normal.   Gait is normal.  Tandem gait is normal.     DIAGNOSTIC DATA (LABS, IMAGING, TESTING) - I reviewed patient records, labs, notes, testing and imaging myself where available.  Lab Results  Component Value Date   WBC 4.9 09/09/2020   HGB 13.6 09/09/2020   HCT 41.3 09/09/2020   MCV 88.8 09/09/2020   PLT 176 09/09/2020        ASSESSMENT AND PLAN  1. Chronic migraine without aura without status migrainosus, not intractable   2. Fibromyalgia   3. Neck pain     1.   Botox 190 units as follows:: 95 units into muscles of the face and head as follows: Frontalis (5 units x 2 on the left, 5 units x 2 on the right), procerus and corrugators (5 units x 3), temporalis (5 units x 4 on the left and 5 units x 4 on the right), occipitalis (5 units x 2 on the left and 5 units x 2 on the right) 105 Units into the muscles of the neck as follows: Splenius capitis (12.5 units x 2), Splenius  cervicus (7.5 units x 2), C6-C7 paraspinal (5 units x 2), trapezius (12.5 units x 2); rhomboid 10 U x 2 2.  Continue oxycodone and Cymbalta for fibromyalgia pain.. She has not been getting medications from other doctors.  3.   Stay active and exercise as tolerated. 4.   return in 3 months or sooner if there are new or worsening neurologic symptoms.  Jair Lindblad A. Epimenio Foot, MD, PhD 09/12/2020, 11:21 PM Certified in Neurology, Clinical  Neurophysiology, Sleep Medicine, Pain Medicine and Neuroimaging  [facility] [address], [address] [place], [place] [address] [phone]

## 2020-09-27 ENCOUNTER — Other Ambulatory Visit: Payer: Self-pay | Admitting: Neurology

## 2020-10-25 ENCOUNTER — Other Ambulatory Visit: Payer: Self-pay | Admitting: Neurology

## 2020-10-30 ENCOUNTER — Other Ambulatory Visit: Payer: Self-pay

## 2020-10-30 ENCOUNTER — Encounter (HOSPITAL_BASED_OUTPATIENT_CLINIC_OR_DEPARTMENT_OTHER): Payer: Self-pay | Admitting: *Deleted

## 2020-10-30 ENCOUNTER — Emergency Department (HOSPITAL_BASED_OUTPATIENT_CLINIC_OR_DEPARTMENT_OTHER): Payer: Medicare Other

## 2020-10-30 ENCOUNTER — Emergency Department (HOSPITAL_BASED_OUTPATIENT_CLINIC_OR_DEPARTMENT_OTHER)
Admission: EM | Admit: 2020-10-30 | Discharge: 2020-10-31 | Disposition: A | Discharge status: Home / Self Care | Payer: Medicare Other | Attending: Emergency Medicine | Admitting: Emergency Medicine

## 2020-10-30 DIAGNOSIS — Z79899 Other long term (current) drug therapy: Secondary | ICD-10-CM | POA: Diagnosis not present

## 2020-10-30 DIAGNOSIS — Z7951 Long term (current) use of inhaled steroids: Secondary | ICD-10-CM | POA: Diagnosis not present

## 2020-10-30 DIAGNOSIS — I2510 Atherosclerotic heart disease of native coronary artery without angina pectoris: Secondary | ICD-10-CM | POA: Diagnosis not present

## 2020-10-30 DIAGNOSIS — J4540 Moderate persistent asthma, uncomplicated: Secondary | ICD-10-CM | POA: Diagnosis not present

## 2020-10-30 DIAGNOSIS — H538 Other visual disturbances: Secondary | ICD-10-CM | POA: Insufficient documentation

## 2020-10-30 DIAGNOSIS — I129 Hypertensive chronic kidney disease with stage 1 through stage 4 chronic kidney disease, or unspecified chronic kidney disease: Secondary | ICD-10-CM | POA: Diagnosis not present

## 2020-10-30 DIAGNOSIS — R202 Paresthesia of skin: Secondary | ICD-10-CM | POA: Diagnosis not present

## 2020-10-30 DIAGNOSIS — E1122 Type 2 diabetes mellitus with diabetic chronic kidney disease: Secondary | ICD-10-CM | POA: Diagnosis not present

## 2020-10-30 DIAGNOSIS — N189 Chronic kidney disease, unspecified: Secondary | ICD-10-CM | POA: Diagnosis not present

## 2020-10-30 DIAGNOSIS — G4489 Other headache syndrome: Secondary | ICD-10-CM

## 2020-10-30 LAB — BASIC METABOLIC PANEL
Anion gap: 5 (ref 5–15)
BUN: 22 mg/dL — ABNORMAL HIGH (ref 6–20)
CO2: 22 mmol/L (ref 22–32)
Calcium: 8.7 mg/dL — ABNORMAL LOW (ref 8.9–10.3)
Chloride: 110 mmol/L (ref 98–111)
Creatinine, Ser: 0.99 mg/dL (ref 0.44–1.00)
GFR, Estimated: >60 mL/min (ref >60)
Glucose, Bld: 115 mg/dL — ABNORMAL HIGH (ref 70–99)
Potassium: 3.8 mmol/L (ref 3.5–5.1)
Sodium: 137 mmol/L (ref 135–145)

## 2020-10-30 LAB — CBC WITH DIFFERENTIAL/PLATELET
Abs Immature Granulocytes: 0.01 10*3/uL (ref 0.00–0.07)
Basophils Absolute: 0.1 10*3/uL (ref 0.0–0.1)
Basophils Relative: 1 %
Eosinophils Absolute: 0.3 10*3/uL (ref 0.0–0.5)
Eosinophils Relative: 5 %
HCT: 39.2 % (ref 36.0–46.0)
Hemoglobin: 13.2 g/dL (ref 12.0–15.0)
Immature Granulocytes: 0 %
Lymphocytes Relative: 44 %
Lymphs Abs: 2.6 10*3/uL (ref 0.7–4.0)
MCH: 29.3 pg (ref 26.0–34.0)
MCHC: 33.7 g/dL (ref 30.0–36.0)
MCV: 86.9 fL (ref 80.0–100.0)
Monocytes Absolute: 0.7 10*3/uL (ref 0.1–1.0)
Monocytes Relative: 12 %
Neutro Abs: 2.2 10*3/uL (ref 1.7–7.7)
Neutrophils Relative %: 38 %
Platelets: 267 10*3/uL (ref 150–400)
RBC: 4.51 MIL/uL (ref 3.87–5.11)
RDW: 14.1 % (ref 11.5–15.5)
WBC: 5.8 10*3/uL (ref 4.0–10.5)
nRBC: 0 % (ref 0.0–0.2)

## 2020-10-30 MED ORDER — SODIUM CHLORIDE 0.9 % IV BOLUS
500.0000 mL | Freq: Once | INTRAVENOUS | Status: AC
  Administered 2020-10-30: 500 mL via INTRAVENOUS

## 2020-10-30 MED ORDER — OXYCODONE-ACETAMINOPHEN 5-325 MG PO TABS
1.0000 | ORAL_TABLET | Freq: Once | ORAL | Status: AC
  Administered 2020-10-30: 1 via ORAL
  Filled 2020-10-30: qty 1

## 2020-10-30 MED ORDER — DEXAMETHASONE SODIUM PHOSPHATE 10 MG/ML IJ SOLN
10.0000 mg | Freq: Once | INTRAMUSCULAR | Status: AC
  Administered 2020-10-30: 10 mg via INTRAVENOUS
  Filled 2020-10-30: qty 1

## 2020-10-30 MED ORDER — DIPHENHYDRAMINE HCL 50 MG/ML IJ SOLN
12.5000 mg | Freq: Once | INTRAMUSCULAR | Status: AC
  Administered 2020-10-30: 12.5 mg via INTRAVENOUS
  Filled 2020-10-30: qty 1

## 2020-10-30 MED ORDER — METOCLOPRAMIDE HCL 5 MG/ML IJ SOLN
10.0000 mg | Freq: Once | INTRAMUSCULAR | Status: AC
  Administered 2020-10-30: 10 mg via INTRAVENOUS
  Filled 2020-10-30: qty 2

## 2020-10-30 NOTE — ED Provider Notes (Signed)
[facility] EMERGENCY DEPARTMENT Provider Note   CSN: [id number] Arrival date & time: 10/30/20  2034     History Chief Complaint  Patient presents with   Headache    Connie Arnold is a 55 y.o. female with history of fibromyalgia, migraines requiring occipital nerve blocks, Botox, on oxycodone, reported pseudotumor cerebri s/p 6 spinal taps, stroke, hypertension, lupus on immunosuppressive medicines, renal insufficiency presents to the ED for evaluation of "tingling and tightness" on her entire scalp with some radiation to the left face, left neck.  This started around 4 pm.  It also happened on Wednesday but it was brief and it resolved on its own.  Today when it started she felt really anxious, restless and nervous.  She had intermittent bilateral blurred vision, lightheadedness, photophobia, nausea, shortness of breath.  She checked her blood pressure and it was 128/117.  She called her primary care doctor but can get an appointment with him and came to the ED for evaluation.  Typically when she has headaches she has discomfort on the forehead and between her eyes.  This feels different than her usual migraines.  She also takes oxycodone for her migraines and takes it as needed.  Denies head injury.  Denies falls.  No double vision, loss of vision, vomiting.  No chest pain, shortness of breath.  No vomiting.  No one-sided weakness or drooping.  When asked what type of symptoms she had during her last stroke she states she does not remember.  Does not take any anticoagulants, takes 324 mg of aspirin daily.  States recently she started drinking coffee again.  Denies tobacco use.  No fevers.  HPI     Past Medical History:  Diagnosis Date   Asthma    Carpal tunnel syndrome 09/29/2014   Chronic back pain    Chronic kidney disease    Diabetes mellitus without complication (HCC)    Fatty infiltration of liver 03/17/2015   Fibromyalgia    Hypertension    IBS (irritable bowel  syndrome)    Interstitial cystitis    Lupus (HCC)    Lupus (systemic lupus erythematosus) (HCC)    Migraines    Pseudotumor cerebri     Patient Active Problem List   Diagnosis Date Noted   TIA (transient ischemic attack) 04/15/2016   Chronic, continuous use of opioids 01/10/2016   Chronic constipation 06/27/2015   Headache, migraine 06/27/2015   Diabetes mellitus without complication (HCC) 06/27/2015   Benign essential HTN 06/27/2015   Encounter for immunization 06/27/2015   Asthma, moderate persistent 06/27/2015   Avitaminosis D 06/27/2015   Abnormal finding in urine 06/13/2015   Medication overuse headache 03/29/2015   Benign intracranial hypertension 03/29/2015   Fatty infiltration of liver 03/17/2015   Insomnia 11/10/2014   Depression 11/10/2014   Migraine without aura, intractable 10/24/2014   Tingling 09/29/2014   Carpal tunnel syndrome 09/29/2014   Cervical disc disorder with radiculopathy of cervical region 08/25/2014   Bursitis, subacromial 08/25/2014   Idiopathic intracranial hypertension 08/10/2014   Chronic migraine without aura without status migrainosus, not intractable 08/10/2014   Neck pain 08/10/2014   Chronic migraine w/o aura w/o status migrainosus, not intractable 06/01/2014   Depression, major, severe recurrence (HCC) 11/01/2013   Severe episode of recurrent major depressive disorder (HCC) 11/01/2013   Blood in the urine 04/29/2013   Abnormal presence of protein in urine 04/29/2013   Chronic interstitial cystitis 11/16/2011   Infection of urinary tract 11/16/2011   Fibromyalgia 05/06/2011  Other long term (current) drug therapy 05/06/2011   Systemic lupus erythematosus (HCC) 05/06/2011   MORBID OBESITY 10/22/2007   SHORTNESS OF BREATH (SOB) 10/22/2007   CORONARY HEART DISEASE 10/21/2007   ALLERGIC RHINITIS 10/21/2007    Past Surgical History:  Procedure Laterality Date   ABDOMINAL HYSTERECTOMY     APPENDECTOMY     CHOLECYSTECTOMY      CYSTOSCOPY       OB History   No obstetric history on file.     Family History  Problem Relation Age of Onset   Graves' disease Mother    Heart disease Father    Prostate cancer Father    Healthy Brother    Healthy Maternal Aunt    Healthy Maternal Uncle     Social History   Tobacco Use   Smoking status: Never   Smokeless tobacco: Never  Vaping Use   Vaping Use: Never used  Substance Use Topics   Alcohol use: No   Drug use: No    Home Medications Prior to Admission medications   Medication Sig Start Date End Date Taking? Authorizing Provider  ADVAIR DISKUS 250-50 MCG/DOSE AEPB Inhale 1 puff into the lungs as needed.  04/16/15   [provider]  albuterol (PROVENTIL HFA;VENTOLIN HFA) 108 (90 Base) MCG/ACT inhaler Inhale into the lungs. 10/21/16   [provider]  albuterol (PROVENTIL) (2.5 MG/3ML) 0.083% nebulizer solution  07/27/14   [provider]  Azelastine-Fluticasone 137-50 MCG/ACT SUSP Place 1 spray into the nose as needed.     [provider]  baclofen (LIORESAL) 10 MG tablet TAKE 1 TO 2 TABLETS BY  MOUTH 3 TIMES DAILY 11/26/19   Sater, Pearletha Furl, MD  Belimumab 200 MG/ML SOAJ Inject 1 Dose into the skin once a week.     [provider]  bupivacaine (MARCAINE PRESERVATIVE FREE) 0.5 % SOLN injection  02/21/15   [provider]  cetirizine (ZYRTEC) 10 MG tablet Take 10 mg by mouth daily.     [provider]  DULoxetine (CYMBALTA) 60 MG capsule Take 1 capsule (60 mg total) by mouth daily. 03/09/18   Sater, Pearletha Furl, MD  fluconazole (DIFLUCAN) 150 MG tablet Take 150 mg by mouth once.     [provider]  FLUoxetine (PROZAC) 40 MG capsule Take 40 mg by mouth daily.  11/01/16   [provider]  furosemide (LASIX) 40 MG tablet Take 40 mg by mouth daily.  08/05/14   [provider]  glucose blood test strip  11/24/14   [provider]  heparin 63875 UNIT/ML injection Instill 4 cc into  bladder 2x daily 11/29/14   [provider]  hydroxychloroquine (PLAQUENIL) 200 MG tablet Take 1 tablet (200 mg total) by mouth 2 (two) times daily. 01/05/14   Teressa Lower, NP  ipratropium-albuterol (DUONEB) 0.5-2.5 (3) MG/3ML SOLN 3 mLs as needed.     [provider]  lactulose, encephalopathy, (CHRONULAC) 10 GM/15ML SOLN daily as needed.  08/26/14   [provider]  montelukast (SINGULAIR) 10 MG tablet Take 10 mg by mouth at bedtime.  11/01/16   [provider]  oxyCODONE (ROXICODONE) 15 MG immediate release tablet TAKE ONE TABLET BY MOUTH EVERY EIGHT HOURS AS NEEDED FOR PAIN 10/26/20   Sater, Pearletha Furl, MD  phentermine 37.5 MG capsule Take 1 capsule by mouth once daily in the morning 09/12/20   Sater, Pearletha Furl, MD  predniSONE (DELTASONE) 20 MG tablet Take 2 tablets (40 mg total) by mouth daily.  09/09/20   Gwyneth SproutPlunkett, Whitney, MD  promethazine (PHENERGAN) 50 MG/ML injection Inject into the muscle as needed.  03/03/09   [provider]  topiramate (TOPAMAX) 200 MG tablet TAKE 1 TABLET BY MOUTH  TWICE DAILY 11/26/19   Sater, Pearletha Furlichard A, MD  traZODone (DESYREL) 100 MG tablet Take 1 tablet (100 mg total) by mouth at bedtime. 12/08/19   Sater, Pearletha Furlichard A, MD  triamcinolone (NASACORT) 55 MCG/ACT AERO nasal inhaler Place 2 sprays into the nose.    [provider]  trimethoprim (TRIMPEX) 100 MG tablet Take 1 tablet by mouth  daily 10/27/14   [provider]  Vitamin D, Ergocalciferol, (DRISDOL) 50000 UNITS CAPS capsule Take 50,000 Units by mouth every 7 (seven) days.     [provider]    Allergies    Nsaids, Codeine, Ramipril, and Imitrex [sumatriptan]  Review of Systems   Review of Systems  Eyes:  Positive for photophobia and visual disturbance.  Respiratory:  Positive for shortness of breath.   Gastrointestinal:  Positive for nausea.  Neurological:  Positive for light-headedness.       Tingling scalp face neck   Psychiatric/Behavioral:  The patient is nervous/anxious.   All other systems reviewed and are negative.  Physical Exam Updated Vital Signs BP 121/79   Pulse 77   Temp 98.3 F (36.8 C) (Oral)   Resp 19   Ht 5\' 5"  (1.651 m)   Wt 97.3 kg   SpO2 100%   BMI 35.70 kg/m   Physical Exam Vitals and nursing note reviewed.  Constitutional:      General: She is not in acute distress.    Appearance: She is well-developed.     Comments: NAD.  HENT:     Head: Normocephalic and atraumatic.     Comments: No scalp tenderness. No temporal tenderness     Right Ear: External ear normal.     Left Ear: External ear normal.     Nose: Nose normal.  Eyes:     General: No scleral icterus.    Conjunctiva/sclera: Conjunctivae normal.  Neck:     Comments: No midline or paraspinous muscular tenderness. Full ROM of neck, no rigidity.  Cardiovascular:     Rate and Rhythm: Normal rate and regular rhythm.     Heart sounds: Normal heart sounds. No murmur heard. Pulmonary:     Effort: Pulmonary effort is normal.     Breath sounds: Normal breath sounds. No wheezing.  Musculoskeletal:        General: No deformity. Normal range of motion.     Cervical back: Normal range of motion and neck supple.  Skin:    General: Skin is warm and dry.     Capillary Refill: Capillary refill takes less than 2 seconds.  Neurological:     Mental Status: She is alert and oriented to person, place, and time.     Comments:  Mental Status: Patient is awake, alert, oriented to person, place, year, and situation. Patient is able to give a clear and coherent history. Speech is fluent and clear without dysarthria or aphasia. No signs of neglect.  Cranial Nerves: I not tested II visual fields full bilaterally. PERRL.   III, IV, VI EOMs intact without ptosis V sensation to light touch intact in all 3 divisions of trigeminal nerve bilaterally  VII facial movements symmetric bilaterally VIII hearing intact to voice/conversation   IX, X no uvula deviation, symmetric rise of soft palate/uvula XI 5/5 SCM and trapezius strength bilaterally  XII tongue protrusion midline, symmetric L/R movements  Motor: Strength 5/5 in upper/lower extremities.  Sensation to light touch intact in face, upper/lower extremities. No pronator drift. No leg drop.  Cerebellar: No ataxia with finger to nose. Steady gait. No truncal sway.   Psychiatric:        Behavior: Behavior normal.        Thought Content: Thought content normal.        Judgment: Judgment normal.    ED Results / Procedures / Treatments   Labs (all labs ordered are listed, but only abnormal results are displayed) Labs Reviewed  BASIC METABOLIC PANEL - Abnormal; Notable for the following components:      Result Value   Glucose, Bld 115 (*)    BUN 22 (*)    Calcium 8.7 (*)    All other components within normal limits  CBC WITH DIFFERENTIAL/PLATELET    EKG None  Radiology CT Head Wo Contrast  Result Date: 10/30/2020 CLINICAL DATA:  Head tingling EXAM: CT HEAD WITHOUT CONTRAST TECHNIQUE: Contiguous axial images were obtained from the base of the skull through the vertex without intravenous contrast. COMPARISON:  None. FINDINGS: Brain: No acute intracranial abnormality. Specifically, no hemorrhage, hydrocephalus, mass lesion, acute infarction, or significant intracranial injury. Vascular: No hyperdense vessel or unexpected calcification. Skull: No acute calvarial abnormality. Sinuses/Orbits: No acute findings Other: None IMPRESSION: Normal study. Electronically Signed   By: Charlett Nose M.D.   On: 10/30/2020 22:49    Procedures Procedures   Medications Ordered in ED Medications  metoCLOPramide (REGLAN) injection 10 mg (10 mg Intravenous Given 10/30/20 2221)  diphenhydrAMINE (BENADRYL) injection 12.5 mg (12.5 mg Intravenous Given 10/30/20 2220)  oxyCODONE-acetaminophen (PERCOCET/ROXICET) 5-325 MG per tablet 1 tablet (1 tablet Oral Given 10/30/20 2219)  dexamethasone  (DECADRON) injection 10 mg (10 mg Intravenous Given 10/30/20 2221)  sodium chloride 0.9 % bolus 500 mL (500 mLs Intravenous New Bag/Given 10/30/20 2220)    ED Course  I have reviewed the triage vital signs and the nursing notes.  Pertinent labs & imaging results that were available during my care of the patient were reviewed by me and considered in my medical decision making (see chart for details).    MDM Rules/Calculators/A&P                          55 year old female with history of migraines on topiramate, oxycodone presents to the ED for evaluation of "tingling and tightness" to her entire scalp that radiates to the left face and left neck that began around 4 PM today.  Had lightheadedness, nausea, restlessness, anxiety, blurred vision.   EMR triage and nursing notes reviewed. Chart reviewed reveals patient has neurologist. Has had occipital nerve blocks, trigger point injections, Botox. Has failed multiple medications for headache control. She reports diagnosis of pseudotumor cerebri and 6 spinal taps but I do not see documentation of this.   Labs, imaging ordered by me.   Labs reveal - unremarkable  Imaging reveals - CT head unremarkable. Given onset of head discomfort less than 6 hours from presentation, feel CT head is a reasonable study to rule out ICH.   Medicines ordered in ED - reglan, benadryl, decadron, percocet home dose and IVF  ED course & MDM 2350: Patient is reevaluated after migraine cocktail.  She reports complete resolution of her symptoms.  Initially was concerned because her blood pressure was elevated at home but with improvement in symptoms, head discomfort her blood pressure  is completely normal here.  Given reassuring lab work, imaging and complete resolution of headache symptoms, tingling and tightness discomfort in her scalp and face I think she is appropriate for discharge.  Did consider stroke, TIA, meningitis but this is highly unlikely given her  presentation.  Question if may be this is a change in her usual migraine pattern versus complicated migraine.  She is comfortable being discharged.  She has neurology to follow-up with.  Strict return precautions were discussed with patient and her friend who are comfortable with discharge.  Final Clinical Impression(s) / ED Diagnoses Final diagnoses:  Facial tingling sensation  Other headache syndrome    Rx / DC Orders ED Discharge Orders     None        Jerrell Mylar 10/30/20 2351    Tilden Fossa, MD 11/02/20 [phone]

## 2020-10-30 NOTE — ED Notes (Signed)
PA at bedside.

## 2020-10-30 NOTE — ED Triage Notes (Signed)
Headache x 5 hours. Hx of headaches. Tingling in head. Nausea and light headed. She took some anxiety medication. Hydroxyzine with no relief.

## 2020-10-30 NOTE — Discharge Instructions (Addendum)
You were seen in the emergency department for tingling and tightness discomfort in the scalp, face and neck  Lab work was reassuring.  CT of your head was normal.  Your symptoms resolved after IV medicines for migraine  I suspect your symptoms may be from a migraine or changes in your migraine pattern.  We discussed other possibilities like a complicated migraine although you did not have any stroke symptoms today  Your symptoms were not consistent with a stroke, intracranial bleed, infection  Continue all your home medicines  Call your neurologist in the next 48 to 72 hours for further discussion of your symptoms.    Return immediately to the emergency department if you have any stroke symptoms including double vision, loss of vision, speech difficulty, balance problems, one-sided weakness or loss of sensation, fever

## 2020-11-10 DIAGNOSIS — K589 Irritable bowel syndrome without diarrhea: Secondary | ICD-10-CM | POA: Insufficient documentation

## 2020-11-23 ENCOUNTER — Other Ambulatory Visit: Payer: Self-pay | Admitting: Neurology

## 2020-12-12 ENCOUNTER — Ambulatory Visit: Payer: Medicare Other | Admitting: Neurology

## 2020-12-13 ENCOUNTER — Ambulatory Visit: Payer: Medicare Other | Admitting: Neurology

## 2020-12-13 ENCOUNTER — Encounter: Payer: Self-pay | Admitting: Neurology

## 2020-12-13 DIAGNOSIS — M542 Cervicalgia: Secondary | ICD-10-CM

## 2020-12-13 DIAGNOSIS — G43709 Chronic migraine without aura, not intractable, without status migrainosus: Secondary | ICD-10-CM

## 2020-12-13 DIAGNOSIS — F32A Depression, unspecified: Secondary | ICD-10-CM

## 2020-12-13 DIAGNOSIS — M797 Fibromyalgia: Secondary | ICD-10-CM | POA: Diagnosis not present

## 2020-12-13 DIAGNOSIS — G4700 Insomnia, unspecified: Secondary | ICD-10-CM

## 2020-12-13 MED ORDER — TRAZODONE HCL 100 MG PO TABS: 200.0000 mg | ORAL_TABLET | Freq: Every day | ORAL | 3 refills | Status: DC

## 2020-12-13 NOTE — Progress Notes (Signed)
[facility]  PATIENT: Connie Arnold DOB: 11-16-1965    HISTORICAL  CHIEF COMPLAINT:  Chief Complaint  Patient presents with   Procedure    Rm 2, alone. Here for Botox.     HISTORY OF PRESENT ILLNESS:  Connie Arnold is a 55 y.o. woman with chronic migraine headaches and other neurologic issues.    Update 09/12/2020: She has done well with the last Botox -- no headahces the first 2 months but some the last month.   She has tolerated the injections well.  She is also on topiramate to help with her chronic migraines.  HA's are pounding and associated with photophobia, phonophobia and nausea.    Fibromyalgia pain is intermittent - worse if she is prolonged sitting and better with some activity.   Combination of oxycodone, duloxetine has helped the FMS pain.     She has not escalated and is not getting opiates from other doctors. PDMP Database has been reviewed.  She is also on Cymbalta for the FMS.  She is back to  exercising at the gym .    She has sleep maintenance greater and sleep onset insomnia.   She is on trazodone 100 mg po qHS.  Rarely takes melatonin. She has had depression but feels this is stable.     ------------ Chronic Migraine History:   She has had chronic migraine headache with moderate pain, flaring up to severe,  for most of the past couple of years   She is having chronic migraine 30/30 days every month, lasting for more than 4 hours every day.  She has received some benefit from occipital nerve blocks, trigger point injections and medications but the headaches still persist on a daily basis for past couple years.    She had been on Botox for migraine prophylaxis in the past with benefit. However, with changes in insurance she was unable to continue due to very high co-pay.   She is currently on Topamax 200 mg by mouth twice a day and muscle relaxants and opiates.  She has occasional emergency room visits and frequent additional visits to my office  for trigger point injections..  She has tried and failed multiple prophylactic medications for her chronic migraine: Antiepileptics:    zonisamide, topiramate, Keppra.   (Due to obesity, Depakote is a poor choice) Anti-depressants  amitriptyline, imipramine Anti-hypertensives:  Diamox Muscle relaxants:  baclofen, tizanidine NSAID's:  She is unable to tolerate chronic anti-inflammatories due to mild renal insufficiency.  She has had multiple visits to ER and to our office for injections when pain is more severe.     SLE:   [email or web address]   She was diagnosed with SLE in 2006 and has been on Plaquenil x years and more recently Benlysta.    Her Complements were normal 11/17 but ANA is positive (speckled)   ALLERGIES: Allergies  Allergen Reactions   Nsaids Other (See Comments)    Chronic kidney failure   Codeine    Ramipril    Imitrex [Sumatriptan] Nausea And Vomiting    HOME MEDICATIONS:  Current Outpatient Medications:    ADVAIR DISKUS 250-50 MCG/DOSE AEPB, Inhale 1 puff into the lungs as needed. , Disp: , Rfl:    albuterol (PROVENTIL HFA;VENTOLIN HFA) 108 (90 Base) MCG/ACT inhaler, Inhale into the lungs., Disp: , Rfl:    albuterol (PROVENTIL) (2.5 MG/3ML) 0.083% nebulizer solution, , Disp: , Rfl:    Azelastine-Fluticasone 137-50 MCG/ACT SUSP, Place 1 spray into the  nose as needed. , Disp: , Rfl:    baclofen (LIORESAL) 10 MG tablet, TAKE 1 TO 2 TABLETS BY  MOUTH 3 TIMES DAILY, Disp: 540 tablet, Rfl: 3   Belimumab 200 MG/ML SOAJ, Inject 1 Dose into the skin once a week. , Disp: , Rfl:    bupivacaine (MARCAINE PRESERVATIVE FREE) 0.5 % SOLN injection, , Disp: , Rfl:    cetirizine (ZYRTEC) 10 MG tablet, Take 10 mg by mouth daily. , Disp: , Rfl:    DULoxetine (CYMBALTA) 60 MG capsule, Take 1 capsule (60 mg total) by mouth daily., Disp: 90 capsule, Rfl: 4   fluconazole (DIFLUCAN) 150 MG tablet, Take 150 mg by mouth once. , Disp: , Rfl:    FLUoxetine (PROZAC) 40 MG capsule, Take  40 mg by mouth daily. , Disp: , Rfl:    furosemide (LASIX) 40 MG tablet, Take 40 mg by mouth daily. , Disp: , Rfl:    glucose blood test strip, , Disp: , Rfl:    heparin 68341 UNIT/ML injection, Instill 4 cc into bladder 2x daily, Disp: , Rfl:    hydroxychloroquine (PLAQUENIL) 200 MG tablet, Take 1 tablet (200 mg total) by mouth 2 (two) times daily., Disp: 30 tablet, Rfl: 0   ipratropium-albuterol (DUONEB) 0.5-2.5 (3) MG/3ML SOLN, 3 mLs as needed. , Disp: , Rfl:    lactulose, encephalopathy, (CHRONULAC) 10 GM/15ML SOLN, daily as needed. , Disp: , Rfl:    montelukast (SINGULAIR) 10 MG tablet, Take 10 mg by mouth at bedtime. , Disp: , Rfl:    oxyCODONE (ROXICODONE) 15 MG immediate release tablet, TAKE ONE TABLET BY MOUTH EVERY EIGHT HOURS AS NEEDED FOR PAIN, Disp: 90 tablet, Rfl: 0   phentermine 37.5 MG capsule, Take 1 capsule by mouth once daily in the morning, Disp: 30 capsule, Rfl: 5   predniSONE (DELTASONE) 20 MG tablet, Take 2 tablets (40 mg total) by mouth daily., Disp: 10 tablet, Rfl: 0   promethazine (PHENERGAN) 50 MG/ML injection, Inject into the muscle as needed. , Disp: , Rfl:    topiramate (TOPAMAX) 200 MG tablet, TAKE 1 TABLET BY MOUTH  TWICE DAILY, Disp: 180 tablet, Rfl: 3   traZODone (DESYREL) 100 MG tablet, Take 1 tablet (100 mg total) by mouth at bedtime., Disp: 90 tablet, Rfl: 3   triamcinolone (NASACORT) 55 MCG/ACT AERO nasal inhaler, Place 2 sprays into the nose., Disp: , Rfl:    trimethoprim (TRIMPEX) 100 MG tablet, Take 1 tablet by mouth  daily, Disp: , Rfl:    Vitamin D, Ergocalciferol, (DRISDOL) 50000 UNITS CAPS capsule, Take 50,000 Units by mouth every 7 (seven) days. , Disp: , Rfl:   PAST MEDICAL HISTORY: Past Medical History:  Diagnosis Date   Asthma    Carpal tunnel syndrome 09/29/2014   Chronic back pain    Chronic kidney disease    Diabetes mellitus without complication (HCC)    Fatty infiltration of liver 03/17/2015   Fibromyalgia    Hypertension    IBS  (irritable bowel syndrome)    Interstitial cystitis    Lupus (HCC)    Lupus (systemic lupus erythematosus) (HCC)    Migraines    Pseudotumor cerebri     PAST SURGICAL HISTORY: Past Surgical History:  Procedure Laterality Date   ABDOMINAL HYSTERECTOMY     APPENDECTOMY     CHOLECYSTECTOMY     CYSTOSCOPY      FAMILY HISTORY: Family History  Problem Relation Age of Onset   Graves' disease Mother    Heart disease  Father    Prostate cancer Father    Healthy Brother    Healthy Maternal Aunt    Healthy Maternal Uncle     SOCIAL HISTORY:  Social History   Socioeconomic History   Marital status: Married    Spouse name: Not on file   Number of children: Not on file   Years of education: Not on file   Highest education level: Not on file  Occupational History   Not on file  Tobacco Use   Smoking status: Never   Smokeless tobacco: Never  Vaping Use   Vaping Use: Never used  Substance and Sexual Activity   Alcohol use: No   Drug use: No   Sexual activity: Not on file  Other Topics Concern   Not on file  Social History Narrative   Not on file   Social Determinants of Health   Financial Resource Strain: Not on file  Food Insecurity: Not on file  Transportation Needs: Not on file  Physical Activity: Not on file  Stress: Not on file  Social Connections: Not on file  Intimate Partner Violence: Not on file     PHYSICAL EXAM  There were no vitals filed for this visit.   There is no height or weight on file to calculate BMI.   General: The patient is an obese woman in no distress.     Neck: She has mild tenderness over the splenius capitis muscles, right greater than left.  Neurologic Exam  Mental status: The patient is alert and oriented x 3 at the time of the examination. The patient has apparent normal recent and remote memory, with an apparently normal attention span and concentration ability.   Speech is normal.  Cranial nerves: Extraocular movements  are full.     Facial strength is normal.  Trapezius and sternocleidomastoid strength is normal.  Motor:  Muscle bulk is normal.   Tone is normal. Strength is  5 / 5 in limbs   Gait and station: Station is normal.   Gait is normal.  Tandem gait is normal.     DIAGNOSTIC DATA (LABS, IMAGING, TESTING) - I reviewed patient records, labs, notes, testing and imaging myself where available.  Lab Results  Component Value Date   WBC 5.8 10/30/2020   HGB 13.2 10/30/2020   HCT 39.2 10/30/2020   MCV 86.9 10/30/2020   PLT 267 10/30/2020        ASSESSMENT AND PLAN    No diagnosis found.   1.    Botox 190 units as follows:: 95 units into muscles of the face and head as follows: Frontalis (5 units x 2 on the left, 5 units x 2 on the right), procerus and corrugators (5 units x 3), temporalis (5 units x 4 on the left and 5 units x 4 on the right), occipitalis (5 units x 2 on the left and 5 units x 2 on the right) 105 Units into the muscles of the neck as follows: Splenius capitis (12.5 units x 2), Splenius  cervicus (7.5 units x 2), C6-C7 paraspinal (5 units x 2), trapezius (12.5 units x 2); rhomboid 10 U x 2 2.  Continue oxycodone and Cymbalta for fibromyalgia pain..  The PDMP has been reviewed and she is not getting medications from other doctors.  3.   Stay active and exercise as tolerated. 4.   return in 3 months or sooner if there are new or worsening neurologic symptoms.  Connie Arnold A. Epimenio Foot, MD, PhD 12/13/2020, 9:06  AM Certified in Neurology, Clinical Neurophysiology, Sleep Medicine, Pain Medicine and Neuroimaging  [facility] [address] [place], [place] [address] [phone]    [facility]  PATIENT: Connie Arnold DOB: Apr 29, 1966    HISTORICAL  CHIEF COMPLAINT:  Chief Complaint  Patient presents with   Procedure    Rm 2, alone. Here for Botox.     HISTORY OF PRESENT ILLNESS:  Connie Arnold is a 54 y.o. woman with  chronic migraine headaches and other neurologic issues.    Update 03/15/2020: She has had about 15 migraines the last 30 days but did better the first 2  Months of the botox cycle.  Her last injections were 03/15/2020.  She has tolerated the injections well.  She is also on topiramate to help with her chronic migraines.  HA's are pounding and associated with photophobia, phonophobia and nausea.    Fibromyalgia pain is a little worse and she notes being less active.    She finds more benefit from oxycodone than other medications.   She   tolerates it well.    She has not escalated and is not getting opiates from other doctors. PDMP Database has been reviewed.  She is also on Cymbalta for the FMS.  She was exercising at the gym but due to increased Covid cases she has not gone in 4 months  She has sleep maintenance greater than sleep onset insomnia.  She has had depression but feels this is stable.     ------------ Chronic Migraine History:   She has had chronic migraine headache with moderate pain, flaring up to severe,  for most of the past couple of years   She is having chronic migraine 30/30 days every month, lasting for more than 4 hours every day.  She has received some benefit from occipital nerve blocks, trigger point injections and medications but the headaches still persist on a daily basis for past couple years.    She had been on Botox for migraine prophylaxis in the past with benefit. However, with changes in insurance she was unable to continue due to very high co-pay.   She is currently on Topamax 200 mg by mouth twice a day and muscle relaxants and opiates.  She has occasional emergency room visits and frequent additional visits to my office for trigger point injections..  She has tried and failed multiple prophylactic medications for her chronic migraine: Antiepileptics:    zonisamide, topiramate, Keppra.   (Due to obesity, Depakote is a poor choice) Anti-depressants  amitriptyline,  imipramine Anti-hypertensives:  Diamox Muscle relaxants:  baclofen, tizanidine NSAID's:  She is unable to tolerate chronic anti-inflammatories due to mild renal insufficiency.  She has had multiple visits to ER and to our office for injections when pain is more severe.     SLE:   She see Dr. Nydia Bouton at [facility] [place].   She was diagnosed with SLE in 2006 and has been on Plaquenil x years and more recently Benlysta.    Her Complements were normal 11/17 but ANA is positive (speckled)   ALLERGIES: Allergies  Allergen Reactions   Nsaids Other (See Comments)    Chronic kidney failure   Codeine    Ramipril    Imitrex [Sumatriptan] Nausea And Vomiting    HOME MEDICATIONS:  Current Outpatient Medications:    ADVAIR DISKUS 250-50 MCG/DOSE AEPB, Inhale 1 puff into the lungs as needed. , Disp: , Rfl:    albuterol (PROVENTIL HFA;VENTOLIN HFA) 108 (90 Base) MCG/ACT  inhaler, Inhale into the lungs., Disp: , Rfl:    albuterol (PROVENTIL) (2.5 MG/3ML) 0.083% nebulizer solution, , Disp: , Rfl:    Azelastine-Fluticasone 137-50 MCG/ACT SUSP, Place 1 spray into the nose as needed. , Disp: , Rfl:    baclofen (LIORESAL) 10 MG tablet, TAKE 1 TO 2 TABLETS BY  MOUTH 3 TIMES DAILY, Disp: 540 tablet, Rfl: 3   Belimumab 200 MG/ML SOAJ, Inject 1 Dose into the skin once a week. , Disp: , Rfl:    bupivacaine (MARCAINE PRESERVATIVE FREE) 0.5 % SOLN injection, , Disp: , Rfl:    cetirizine (ZYRTEC) 10 MG tablet, Take 10 mg by mouth daily. , Disp: , Rfl:    DULoxetine (CYMBALTA) 60 MG capsule, Take 1 capsule (60 mg total) by mouth daily., Disp: 90 capsule, Rfl: 4   fluconazole (DIFLUCAN) 150 MG tablet, Take 150 mg by mouth once. , Disp: , Rfl:    FLUoxetine (PROZAC) 40 MG capsule, Take 40 mg by mouth daily. , Disp: , Rfl:    furosemide (LASIX) 40 MG tablet, Take 40 mg by mouth daily. , Disp: , Rfl:    glucose blood test strip, , Disp: , Rfl:    heparin 66440 UNIT/ML injection, Instill 4 cc into bladder 2x daily, Disp: , Rfl:     hydroxychloroquine (PLAQUENIL) 200 MG tablet, Take 1 tablet (200 mg total) by mouth 2 (two) times daily., Disp: 30 tablet, Rfl: 0   ipratropium-albuterol (DUONEB) 0.5-2.5 (3) MG/3ML SOLN, 3 mLs as needed. , Disp: , Rfl:    lactulose, encephalopathy, (CHRONULAC) 10 GM/15ML SOLN, daily as needed. , Disp: , Rfl:    montelukast (SINGULAIR) 10 MG tablet, Take 10 mg by mouth at bedtime. , Disp: , Rfl:    oxyCODONE (ROXICODONE) 15 MG immediate release tablet, TAKE ONE TABLET BY MOUTH EVERY EIGHT HOURS AS NEEDED FOR PAIN, Disp: 90 tablet, Rfl: 0   phentermine 37.5 MG capsule, Take 1 capsule by mouth once daily in the morning, Disp: 30 capsule, Rfl: 5   predniSONE (DELTASONE) 20 MG tablet, Take 2 tablets (40 mg total) by mouth daily., Disp: 10 tablet, Rfl: 0   promethazine (PHENERGAN) 50 MG/ML injection, Inject into the muscle as needed. , Disp: , Rfl:    topiramate (TOPAMAX) 200 MG tablet, TAKE 1 TABLET BY MOUTH  TWICE DAILY, Disp: 180 tablet, Rfl: 3   traZODone (DESYREL) 100 MG tablet, Take 1 tablet (100 mg total) by mouth at bedtime., Disp: 90 tablet, Rfl: 3   triamcinolone (NASACORT) 55 MCG/ACT AERO nasal inhaler, Place 2 sprays into the nose., Disp: , Rfl:    trimethoprim (TRIMPEX) 100 MG tablet, Take 1 tablet by mouth  daily, Disp: , Rfl:    Vitamin D, Ergocalciferol, (DRISDOL) 50000 UNITS CAPS capsule, Take 50,000 Units by mouth every 7 (seven) days. , Disp: , Rfl:   PAST MEDICAL HISTORY: Past Medical History:  Diagnosis Date   Asthma    Carpal tunnel syndrome 09/29/2014   Chronic back pain    Chronic kidney disease    Diabetes mellitus without complication (HCC)    Fatty infiltration of liver 03/17/2015   Fibromyalgia    Hypertension    IBS (irritable bowel syndrome)    Interstitial cystitis    Lupus (HCC)    Lupus (systemic lupus erythematosus) (HCC)    Migraines    Pseudotumor cerebri     PAST SURGICAL HISTORY: Past Surgical History:  Procedure Laterality Date   ABDOMINAL  HYSTERECTOMY     APPENDECTOMY  CHOLECYSTECTOMY     CYSTOSCOPY      FAMILY HISTORY: Family History  Problem Relation Age of Onset   Graves' disease Mother    Heart disease Father    Prostate cancer Father    Healthy Brother    Healthy Maternal Aunt    Healthy Maternal Uncle     SOCIAL HISTORY:  Social History   Socioeconomic History   Marital status: Married    Spouse name: Not on file   Number of children: Not on file   Years of education: Not on file   Highest education level: Not on file  Occupational History   Not on file  Tobacco Use   Smoking status: Never   Smokeless tobacco: Never  Vaping Use   Vaping Use: Never used  Substance and Sexual Activity   Alcohol use: No   Drug use: No   Sexual activity: Not on file  Other Topics Concern   Not on file  Social History Narrative   Not on file   Social Determinants of Health   Financial Resource Strain: Not on file  Food Insecurity: Not on file  Transportation Needs: Not on file  Physical Activity: Not on file  Stress: Not on file  Social Connections: Not on file  Intimate Partner Violence: Not on file     PHYSICAL EXAM  There were no vitals filed for this visit.   There is no height or weight on file to calculate BMI.   General: The patient is an obese woman in no distress.     Neck: She has mild tenderness over the splenius capitis muscles, right greater than left.  Neurologic Exam  Mental status: The patient is alert and oriented x 3 at the time of the examination. The patient has apparent normal recent and remote memory, with an apparently normal attention span and concentration ability.   Speech is normal.  Cranial nerves: Extraocular movements are full.     Facial strength is normal.  Trapezius and sternocleidomastoid strength is normal.  Motor:  Muscle bulk is normal.   Tone is normal. Strength is  5 / 5 in limbs   Gait and station: Station is normal.   Gait is normal.  Tandem gait is  normal.     DIAGNOSTIC DATA (LABS, IMAGING, TESTING) - I reviewed patient records, labs, notes, testing and imaging myself where available.  Lab Results  Component Value Date   WBC 5.8 10/30/2020   HGB 13.2 10/30/2020   HCT 39.2 10/30/2020   MCV 86.9 10/30/2020   PLT 267 10/30/2020        ASSESSMENT AND PLAN    No diagnosis found.   1.    Botox 190 units as follows:: 95 units into muscles of the face and head as follows: Frontalis (5 units x 2 on the left, 5 units x 2 on the right), procerus and corrugators (5 units x 3), temporalis (5 units x 4 on the left and 5 units x 4 on the right), occipitalis (5 units x 2 on the left and 5 units x 2 on the right) 105 Units into the muscles of the neck as follows: Splenius capitis (12.5 units x 2), Splenius  cervicus (7.5 units x 2), C6-C7 paraspinal (5 units x 2), trapezius (12.5 units x 2); rhomboid 10 U x 2 2.  Continue oxycodone and Cymbalta for fibromyalgia pain..  The PDMP has been reviewed and she is not getting medications from other doctors.  3.  Stay active and exercise as tolerated. 4.   return in 3 months or sooner if there are new or worsening neurologic symptoms.  Ainhoa Rallo A. Epimenio Foot, MD, PhD 12/13/2020, 9:06 AM Certified in Neurology, Clinical Neurophysiology, Sleep Medicine, Pain Medicine and Neuroimaging  [facility] [address] [place], [place] [address] [phone]    [facility]  PATIENT: Connie Arnold DOB: Feb 16, 1966    HISTORICAL  CHIEF COMPLAINT:  Chief Complaint  Patient presents with   Procedure    Rm 2, alone. Here for Botox.     HISTORY OF PRESENT ILLNESS:  Connie Arnold is a 55 y.o. woman with chronic migraine headaches and other neurologic issues.    Update 03/15/2020: She has had about 15 migraines the last 30 days but did better the first 2  Months of the botox cycle.  Her last injections were 03/15/2020.  She has tolerated the injections  well.  She is also on topiramate to help with her chronic migraines.  HA's are pounding and associated with photophobia, phonophobia and nausea.    Fibromyalgia pain is a little worse and she notes being less active.    She finds more benefit from oxycodone than other medications.   She   tolerates it well.    She has not escalated and is not getting opiates from other doctors. PDMP Database has been reviewed.  She is also on Cymbalta for the FMS.  She was exercising at the gym but due to increased Covid cases she has not gone in 4 months  She has sleep maintenance greater than sleep onset insomnia.  She has had depression but feels this is stable.     ------------ Chronic Migraine History:   She has had chronic migraine headache with moderate pain, flaring up to severe,  for most of the past couple of years   She is having chronic migraine 30/30 days every month, lasting for more than 4 hours every day.  She has received some benefit from occipital nerve blocks, trigger point injections and medications but the headaches still persist on a daily basis for past couple years.    She had been on Botox for migraine prophylaxis in the past with benefit. However, with changes in insurance she was unable to continue due to very high co-pay.   She is currently on Topamax 200 mg by mouth twice a day and muscle relaxants and opiates.  She has occasional emergency room visits and frequent additional visits to my office for trigger point injections..  She has tried and failed multiple prophylactic medications for her chronic migraine: Antiepileptics:    zonisamide, topiramate, Keppra.   (Due to obesity, Depakote is a poor choice) Anti-depressants  amitriptyline, imipramine Anti-hypertensives:  Diamox Muscle relaxants:  baclofen, tizanidine NSAID's:  She is unable to tolerate chronic anti-inflammatories due to mild renal insufficiency.  She has had multiple visits to ER and to our office for injections when  pain is more severe.     SLE:   She see Dr. Nydia Bouton at [facility].   She was diagnosed with SLE in 2006 and has been on Plaquenil x years and more recently Benlysta.    Her Complements were normal 11/17 but ANA is positive (speckled)   ALLERGIES: Allergies  Allergen Reactions   Nsaids Other (See Comments)    Chronic kidney failure   Codeine    Ramipril    Imitrex [Sumatriptan] Nausea And Vomiting    HOME MEDICATIONS:  Current Outpatient Medications:  ADVAIR DISKUS 250-50 MCG/DOSE AEPB, Inhale 1 puff into the lungs as needed. , Disp: , Rfl:    albuterol (PROVENTIL HFA;VENTOLIN HFA) 108 (90 Base) MCG/ACT inhaler, Inhale into the lungs., Disp: , Rfl:    albuterol (PROVENTIL) (2.5 MG/3ML) 0.083% nebulizer solution, , Disp: , Rfl:    Azelastine-Fluticasone 137-50 MCG/ACT SUSP, Place 1 spray into the nose as needed. , Disp: , Rfl:    baclofen (LIORESAL) 10 MG tablet, TAKE 1 TO 2 TABLETS BY  MOUTH 3 TIMES DAILY, Disp: 540 tablet, Rfl: 3   Belimumab 200 MG/ML SOAJ, Inject 1 Dose into the skin once a week. , Disp: , Rfl:    bupivacaine (MARCAINE PRESERVATIVE FREE) 0.5 % SOLN injection, , Disp: , Rfl:    cetirizine (ZYRTEC) 10 MG tablet, Take 10 mg by mouth daily. , Disp: , Rfl:    DULoxetine (CYMBALTA) 60 MG capsule, Take 1 capsule (60 mg total) by mouth daily., Disp: 90 capsule, Rfl: 4   fluconazole (DIFLUCAN) 150 MG tablet, Take 150 mg by mouth once. , Disp: , Rfl:    FLUoxetine (PROZAC) 40 MG capsule, Take 40 mg by mouth daily. , Disp: , Rfl:    furosemide (LASIX) 40 MG tablet, Take 40 mg by mouth daily. , Disp: , Rfl:    glucose blood test strip, , Disp: , Rfl:    heparin 47654 UNIT/ML injection, Instill 4 cc into bladder 2x daily, Disp: , Rfl:    hydroxychloroquine (PLAQUENIL) 200 MG tablet, Take 1 tablet (200 mg total) by mouth 2 (two) times daily., Disp: 30 tablet, Rfl: 0   ipratropium-albuterol (DUONEB) 0.5-2.5 (3) MG/3ML SOLN, 3 mLs as needed. , Disp: , Rfl:    lactulose, encephalopathy,  (CHRONULAC) 10 GM/15ML SOLN, daily as needed. , Disp: , Rfl:    montelukast (SINGULAIR) 10 MG tablet, Take 10 mg by mouth at bedtime. , Disp: , Rfl:    oxyCODONE (ROXICODONE) 15 MG immediate release tablet, TAKE ONE TABLET BY MOUTH EVERY EIGHT HOURS AS NEEDED FOR PAIN, Disp: 90 tablet, Rfl: 0   phentermine 37.5 MG capsule, Take 1 capsule by mouth once daily in the morning, Disp: 30 capsule, Rfl: 5   predniSONE (DELTASONE) 20 MG tablet, Take 2 tablets (40 mg total) by mouth daily., Disp: 10 tablet, Rfl: 0   promethazine (PHENERGAN) 50 MG/ML injection, Inject into the muscle as needed. , Disp: , Rfl:    topiramate (TOPAMAX) 200 MG tablet, TAKE 1 TABLET BY MOUTH  TWICE DAILY, Disp: 180 tablet, Rfl: 3   traZODone (DESYREL) 100 MG tablet, Take 1 tablet (100 mg total) by mouth at bedtime., Disp: 90 tablet, Rfl: 3   triamcinolone (NASACORT) 55 MCG/ACT AERO nasal inhaler, Place 2 sprays into the nose., Disp: , Rfl:    trimethoprim (TRIMPEX) 100 MG tablet, Take 1 tablet by mouth  daily, Disp: , Rfl:    Vitamin D, Ergocalciferol, (DRISDOL) 50000 UNITS CAPS capsule, Take 50,000 Units by mouth every 7 (seven) days. , Disp: , Rfl:   PAST MEDICAL HISTORY: Past Medical History:  Diagnosis Date   Asthma    Carpal tunnel syndrome 09/29/2014   Chronic back pain    Chronic kidney disease    Diabetes mellitus without complication (HCC)    Fatty infiltration of liver 03/17/2015   Fibromyalgia    Hypertension    IBS (irritable bowel syndrome)    Interstitial cystitis    Lupus (HCC)    Lupus (systemic lupus erythematosus) (HCC)    Migraines  Pseudotumor cerebri     PAST SURGICAL HISTORY: Past Surgical History:  Procedure Laterality Date   ABDOMINAL HYSTERECTOMY     APPENDECTOMY     CHOLECYSTECTOMY     CYSTOSCOPY      FAMILY HISTORY: Family History  Problem Relation Age of Onset   Graves' disease Mother    Heart disease Father    Prostate cancer Father    Healthy Brother    Healthy Maternal  Aunt    Healthy Maternal Uncle     SOCIAL HISTORY:  Social History   Socioeconomic History   Marital status: Married    Spouse name: Not on file   Number of children: Not on file   Years of education: Not on file   Highest education level: Not on file  Occupational History   Not on file  Tobacco Use   Smoking status: Never   Smokeless tobacco: Never  Vaping Use   Vaping Use: Never used  Substance and Sexual Activity   Alcohol use: No   Drug use: No   Sexual activity: Not on file  Other Topics Concern   Not on file  Social History Narrative   Not on file   Social Determinants of Health   Financial Resource Strain: Not on file  Food Insecurity: Not on file  Transportation Needs: Not on file  Physical Activity: Not on file  Stress: Not on file  Social Connections: Not on file  Intimate Partner Violence: Not on file     PHYSICAL EXAM  There were no vitals filed for this visit.   There is no height or weight on file to calculate BMI.   General: The patient is an obese woman in no distress.     Neck: She has mild tenderness over the splenius capitis muscles, right greater than left.  Neurologic Exam  Mental status: The patient is alert and oriented x 3 at the time of the examination. The patient has apparent normal recent and remote memory, with an apparently normal attention span and concentration ability.   Speech is normal.  Cranial nerves: Extraocular movements are full.  No ptosis.    Facial strength is normal.  Trapezius and sternocleidomastoid strength is normal.  Motor:  Muscle bulk is normal.   Tone is normal. Strength is  5 / 5 in limbs   Gait and station: Station is normal.   Gait is normal.  Tandem gait is normal.     DIAGNOSTIC DATA (LABS, IMAGING, TESTING) - I reviewed patient records, labs, notes, testing and imaging myself where available.  Lab Results  Component Value Date   WBC 5.8 10/30/2020   HGB 13.2 10/30/2020   HCT 39.2  10/30/2020   MCV 86.9 10/30/2020   PLT 267 10/30/2020        ASSESSMENT AND PLAN    No diagnosis found.   1.   Inject Botox 190 units as follows:: 95 units into muscles of the face and head as follows: Frontalis (5 units x 2 on the left, 5 units x 2 on the right), procerus and corrugators (5 units x 3), temporalis (5 units x 4 on the left and 5 units x 4 on the right), occipitalis (5 units x 2 on the left and 5 units x 2 on the right) 105 Units into the muscles of the neck as follows: Splenius capitis (12.5 units x 2), Splenius  cervicus (7.5 units x 2), C6-C7 paraspinal (5 units x 2), trapezius (12.5 units x 2); rhomboid  10 U x 2 2.  Continue oxycodone and Cymbalta for fibromyalgia pain.  Does not need refill today.. She has not been getting medications from other practices.  3.   Stay active and exercise as tolerated. 4.   Take trazodone 100 mg with 5 mg melatonin 90 minutes before bedtime and again 30 minutes before bedtime.   5.    return in 3 months or sooner if there are new or worsening neurologic symptoms.  Ostin Mathey A. Epimenio Foot, MD, PhD 12/13/2020, 9:06 AM Certified in Neurology, Clinical Neurophysiology, Sleep Medicine, Pain Medicine and Neuroimaging  [facility] [address] [place], [place] [address] [phone]

## 2020-12-21 ENCOUNTER — Other Ambulatory Visit: Payer: Self-pay | Admitting: Neurology

## 2021-01-18 ENCOUNTER — Other Ambulatory Visit: Payer: Self-pay | Admitting: Neurology

## 2021-02-15 ENCOUNTER — Other Ambulatory Visit: Payer: Self-pay | Admitting: Neurology

## 2021-02-19 ENCOUNTER — Other Ambulatory Visit: Payer: Self-pay | Admitting: Neurology

## 2021-02-19 NOTE — Telephone Encounter (Signed)
Patient is due for a refill on oxycodone. Patient is up to date on her appointments. [place] Controlled Substance Registry checked and is appropriate.

## 2021-03-14 ENCOUNTER — Telehealth: Payer: Self-pay | Admitting: Neurology

## 2021-03-14 NOTE — Telephone Encounter (Signed)
Patient has a Botox appointment 11/1. As of 03/20/21, [facility] Medicare will require PA of Botox. I called [facility] @ [phone] and spoke with Christen Bame, he states PA is not required for CPT 64615, only J0585. Reference [id number]. I submitted PA request via [employer] portal for 155 units of Botox for G43.709, every 12 weeks. Request was approved, PA #[id number] (03/14/21- 03/14/22). PA phone: [phone].

## 2021-03-20 ENCOUNTER — Encounter: Payer: Self-pay | Admitting: Neurology

## 2021-03-20 ENCOUNTER — Ambulatory Visit: Payer: Medicare Other | Admitting: Neurology

## 2021-03-20 DIAGNOSIS — F32A Depression, unspecified: Secondary | ICD-10-CM

## 2021-03-20 DIAGNOSIS — G43709 Chronic migraine without aura, not intractable, without status migrainosus: Secondary | ICD-10-CM

## 2021-03-20 DIAGNOSIS — G4700 Insomnia, unspecified: Secondary | ICD-10-CM | POA: Diagnosis not present

## 2021-03-20 DIAGNOSIS — M797 Fibromyalgia: Secondary | ICD-10-CM | POA: Diagnosis not present

## 2021-03-20 MED ORDER — PHENTERMINE HCL 37.5 MG PO CAPS: ORAL_CAPSULE | ORAL | 5 refills | Status: DC

## 2021-03-20 MED ORDER — OXYCODONE HCL 15 MG PO TABS: 15.0000 mg | ORAL_TABLET | Freq: Three times a day (TID) | ORAL | 0 refills | Status: DC | PRN

## 2021-03-20 NOTE — Progress Notes (Addendum)
[facility]  PATIENT: Connie Arnold DOB: [birth year]    HISTORICAL  CHIEF COMPLAINT:  Chief Complaint  Patient presents with   Procedure    Rm 1, alone. Here for Botox.     HISTORY OF PRESENT ILLNESS:  Connie Arnold is a 55 y.o. woman with chronic migraine headaches and other neurologic issues.    Update 03/20/2021: She continues to get a benefit from Botox.  Headaches have been daily before the Botox.  She now gets only a couple of the week especially during the first 2 and half months and sometimes has more headaches in the last couple weeks of each cycle.  She has tolerated the injections well.  Specifically, no eyelid drooping or neck weakness.  She is also on topiramate to help with her chronic migraines.  HA's are pounding and associated with photophobia, phonophobia and nausea.    She also has fibromyalgia and her pain worsens if she is less active..   Combination of oxycodone, duloxetine has helped the FMS pain.     She has not escalated and is not getting opiates from other doctors. PDMP Database has been reviewed.  She is also on Cymbalta for the FMS.  She is back to  exercising at the gym .    She has sleep maintenance greater and sleep onset insomnia.   She is on trazodone 100 mg po qHS.  Mood is stable.    ------------ Chronic Migraine History:   She has had chronic migraine headache with moderate pain, flaring up to severe,  for most of the past couple of years   She is having chronic migraine 30/30 days every month, lasting for more than 4 hours every day.  She has received some benefit from occipital nerve blocks, trigger point injections and medications but the headaches still persist on a daily basis for past couple years.    She had been on Botox for migraine prophylaxis in the past with benefit. However, with changes in insurance she was unable to continue due to very high co-pay.   She is currently on Topamax 200 mg by mouth twice a day and  muscle relaxants and opiates.  She has occasional emergency room visits and frequent additional visits to my office for trigger point injections..  She has tried and failed multiple prophylactic medications for her chronic migraine: Antiepileptics:    zonisamide, topiramate, Keppra.   (Due to obesity, Depakote is a poor choice) Anti-depressants  amitriptyline, imipramine Anti-hypertensives:  Diamox Muscle relaxants:  baclofen, tizanidine NSAID's:  She is unable to tolerate chronic anti-inflammatories due to mild renal insufficiency.  She has had multiple visits to ER and to our office for injections when pain is more severe.     SLE:   She see Dr. Nydia Bouton at [facility].   She was diagnosed with SLE in 2006 and has been on Plaquenil x years and more recently Benlysta.    Her Complements were normal 11/17 but ANA is positive (speckled)   ALLERGIES: Allergies  Allergen Reactions   Nsaids Other (See Comments)    Chronic kidney failure   Codeine    Ramipril    Imitrex [Sumatriptan] Nausea And Vomiting    HOME MEDICATIONS:  Current Outpatient Medications:    ADVAIR DISKUS 250-50 MCG/DOSE AEPB, Inhale 1 puff into the lungs as needed. , Disp: , Rfl:    albuterol (PROVENTIL HFA;VENTOLIN HFA) 108 (90 Base) MCG/ACT inhaler, Inhale into the lungs., Disp: , Rfl:    albuterol (PROVENTIL) (  2.5 MG/3ML) 0.083% nebulizer solution, , Disp: , Rfl:    Azelastine-Fluticasone 137-50 MCG/ACT SUSP, Place 1 spray into the nose as needed. , Disp: , Rfl:    baclofen (LIORESAL) 10 MG tablet, TAKE 1 TO 2 TABLETS BY  MOUTH 3 TIMES DAILY, Disp: 540 tablet, Rfl: 3   Belimumab 200 MG/ML SOAJ, Inject 1 Dose into the skin once a week. , Disp: , Rfl:    bupivacaine (MARCAINE PRESERVATIVE FREE) 0.5 % SOLN injection, , Disp: , Rfl:    cetirizine (ZYRTEC) 10 MG tablet, Take 10 mg by mouth daily. , Disp: , Rfl:    DULoxetine (CYMBALTA) 60 MG capsule, Take 1 capsule (60 mg total) by mouth daily., Disp: 90 capsule, Rfl: 4    fluconazole (DIFLUCAN) 150 MG tablet, Take 150 mg by mouth once. , Disp: , Rfl:    FLUoxetine (PROZAC) 40 MG capsule, Take 40 mg by mouth daily. , Disp: , Rfl:    furosemide (LASIX) 40 MG tablet, Take 40 mg by mouth daily. , Disp: , Rfl:    glucose blood test strip, , Disp: , Rfl:    heparin 75170 UNIT/ML injection, Instill 4 cc into bladder 2x daily, Disp: , Rfl:    hydroxychloroquine (PLAQUENIL) 200 MG tablet, Take 1 tablet (200 mg total) by mouth 2 (two) times daily., Disp: 30 tablet, Rfl: 0   ipratropium-albuterol (DUONEB) 0.5-2.5 (3) MG/3ML SOLN, 3 mLs as needed. , Disp: , Rfl:    lactulose, encephalopathy, (CHRONULAC) 10 GM/15ML SOLN, daily as needed. , Disp: , Rfl:    montelukast (SINGULAIR) 10 MG tablet, Take 10 mg by mouth at bedtime. , Disp: , Rfl:    predniSONE (DELTASONE) 20 MG tablet, Take 2 tablets (40 mg total) by mouth daily., Disp: 10 tablet, Rfl: 0   promethazine (PHENERGAN) 50 MG/ML injection, Inject into the muscle as needed. , Disp: , Rfl:    topiramate (TOPAMAX) 200 MG tablet, TAKE 1 TABLET BY MOUTH  TWICE DAILY, Disp: 180 tablet, Rfl: 3   traZODone (DESYREL) 100 MG tablet, Take 2 tablets (200 mg total) by mouth at bedtime., Disp: 90 tablet, Rfl: 3   triamcinolone (NASACORT) 55 MCG/ACT AERO nasal inhaler, Place 2 sprays into the nose., Disp: , Rfl:    trimethoprim (TRIMPEX) 100 MG tablet, Take 1 tablet by mouth  daily, Disp: , Rfl:    Vitamin D, Ergocalciferol, (DRISDOL) 50000 UNITS CAPS capsule, Take 50,000 Units by mouth every 7 (seven) days. , Disp: , Rfl:    oxyCODONE (ROXICODONE) 15 MG immediate release tablet, Take 1 tablet (15 mg total) by mouth every 8 (eight) hours as needed. for pain, Disp: 90 tablet, Rfl: 0   phentermine 37.5 MG capsule, Take 1 capsule by mouth once daily in the morning, Disp: 30 capsule, Rfl: 5  PAST MEDICAL HISTORY: Past Medical History:  Diagnosis Date   Asthma    Carpal tunnel syndrome 09/29/2014   Chronic back pain    Chronic kidney  disease    Diabetes mellitus without complication (HCC)    Fatty infiltration of liver 03/17/2015   Fibromyalgia    Hypertension    IBS (irritable bowel syndrome)    Interstitial cystitis    Lupus (HCC)    Lupus (systemic lupus erythematosus) (HCC)    Migraines    Pseudotumor cerebri     PAST SURGICAL HISTORY: Past Surgical History:  Procedure Laterality Date   ABDOMINAL HYSTERECTOMY     APPENDECTOMY     CHOLECYSTECTOMY     CYSTOSCOPY  FAMILY HISTORY: Family History  Problem Relation Age of Onset   Graves' disease Mother    Heart disease Father    Prostate cancer Father    Healthy Brother    Healthy Maternal Aunt    Healthy Maternal Uncle     SOCIAL HISTORY:  Social History   Socioeconomic History   Marital status: Married    Spouse name: Not on file   Number of children: Not on file   Years of education: Not on file   Highest education level: Not on file  Occupational History   Not on file  Tobacco Use   Smoking status: Never   Smokeless tobacco: Never  Vaping Use   Vaping Use: Never used  Substance and Sexual Activity   Alcohol use: No   Drug use: No   Sexual activity: Not on file  Other Topics Concern   Not on file  Social History Narrative   Not on file   Social Determinants of Health   Financial Resource Strain: Not on file  Food Insecurity: Not on file  Transportation Needs: Not on file  Physical Activity: Not on file  Stress: Not on file  Social Connections: Not on file  Intimate Partner Violence: Not on file     PHYSICAL EXAM  There were no vitals filed for this visit.   There is no height or weight on file to calculate BMI.   General: The patient is an obese woman in no distress.     Neck: She has mild tenderness over the splenius capitis muscles, right greater than left.  Neurologic Exam  Mental status: The patient is alert and oriented x 3 at the time of the examination. The patient has apparent normal recent and  remote memory, with an apparently normal attention span and concentration ability.   Speech is normal.  Cranial nerves: Extraocular movements are full.     Facial strength is normal.  Trapezius and sternocleidomastoid strength is normal.  Motor:  Muscle bulk is normal.   Tone is normal. Strength is  5 / 5 in limbs   Gait and station: Station is normal.   Gait is normal.  Tandem gait is normal.     DIAGNOSTIC DATA (LABS, IMAGING, TESTING) - I reviewed patient records, labs, notes, testing and imaging myself where available.  Lab Results  Component Value Date   WBC 5.8 10/30/2020   HGB 13.2 10/30/2020   HCT 39.2 10/30/2020   MCV 86.9 10/30/2020   PLT 267 10/30/2020        ASSESSMENT AND PLAN    1. Chronic migraine without aura without status migrainosus, not intractable   2. Fibromyalgia   3. Insomnia, unspecified type   4. Depression, unspecified depression type      1.   Inject Botox 200 units as follows:: 95 units into the muscles of the face and head as follows: Frontalis (5 units x 2 on the left, 5 units x 2 on the right), procerus and corrugators (5 units x 3), temporalis (5 units x 4 on the left and 5 units x 4 on the right), occipitalis (5 units x 2 on the left and 5 units x 2 on the right) 105 Units into the muscles of the neck as follows: Splenius capitis (12.5 units x 2), Splenius  cervicus (7.5 units x 2), C6-C7 paraspinal (5 units x 2), trapezius (12.5 units x 2); rhomboid 10 U x 2 2.  Continue oxycodone and Cymbalta for fibromyalgia pain.  I  will renew the prescription and postdated to 03/22/2021.Marland Kitchen  The PDMP was reviewed.  She has not been getting medications from other practices.  3.   Stay active and exercise as tolerated. 4.   Take trazodone 100 mg with 5 mg melatonin 90 minutes before bedtime and again 30 minutes before bedtime.   5.    return in 3 months or sooner if there are new or worsening neurologic symptoms.  Eboney Claybrook A. Epimenio Foot, MD, PhD 03/20/2021, 11:28  AM Certified in Neurology, Clinical Neurophysiology, Sleep Medicine, Pain Medicine and Neuroimaging  [facility] [address] [place], [place] [address] [phone]

## 2021-03-20 NOTE — Progress Notes (Signed)
Botox- 100 units x 2 vials Lot: [id number] Expiration: 11/24 NDC: 6789-3810-17  0.9% Sodium Chloride- 12mL total Lot: [id number] Expiration: 05/20/22 NDC: 5277-8242-35  Dx: T61.443 B/B

## 2021-04-09 ENCOUNTER — Telehealth: Payer: Self-pay | Admitting: Neurology

## 2021-04-09 NOTE — Telephone Encounter (Signed)
Called the pt back and she states that she has been suffering with a migraine and that it is not getting any better. She states that across the top of her forehead her head is tight and intermittently has facial numbness.  Yesterday her vision completely went out.  Off and on throughout the day her blood pressure has elevated.  Acute intervention wise she has oxycodone which she has taken along with Tylenol and it has not helped.  She is also tried hot showers.  She says that that it feels as if there is pressure on her shoulders.  She is avoided being in the light.  So far nothing has been helpful.  Patient is usually does not have any issues after getting Botox, she last had Botox on November 1. Will discuss with the MD and NP to see if pt should come in or should we try something else in the meantime.

## 2021-04-09 NOTE — Telephone Encounter (Signed)
Spoke with Dr Epimenio Foot and he recommended to call in a steroid dose pack. Pt states that she had recently came off of one for neck/back pain she had going on. Advised that we probably should bring her on in the office then to be able to do an assessment and see what the next steps would be

## 2021-04-09 NOTE — Telephone Encounter (Signed)
Headaches worsening and now tingling down face and back, pt declined a office visit being scheduled.  Pt asking for a call to discuss what can be done.

## 2021-04-10 ENCOUNTER — Telehealth: Payer: Self-pay | Admitting: Neurology

## 2021-04-10 ENCOUNTER — Other Ambulatory Visit: Payer: Self-pay

## 2021-04-10 ENCOUNTER — Ambulatory Visit: Payer: Medicare Other | Admitting: Family Medicine

## 2021-04-10 ENCOUNTER — Encounter: Payer: Self-pay | Admitting: Family Medicine

## 2021-04-10 VITALS — BP 145/91 | HR 76 | Ht 65.0 in | Wt 213.0 lb

## 2021-04-10 DIAGNOSIS — G43709 Chronic migraine without aura, not intractable, without status migrainosus: Secondary | ICD-10-CM

## 2021-04-10 DIAGNOSIS — M542 Cervicalgia: Secondary | ICD-10-CM

## 2021-04-10 DIAGNOSIS — G4700 Insomnia, unspecified: Secondary | ICD-10-CM

## 2021-04-10 DIAGNOSIS — M797 Fibromyalgia: Secondary | ICD-10-CM

## 2021-04-10 MED ORDER — NURTEC 75 MG PO TBDP: 75.0000 mg | ORAL_TABLET | Freq: Every day | ORAL | 11 refills | Status: DC | PRN

## 2021-04-10 MED ORDER — NURTEC 75 MG PO TBDP: 75.0000 mg | ORAL_TABLET | Freq: Every day | ORAL | 0 refills | Status: DC | PRN

## 2021-04-10 NOTE — Patient Instructions (Signed)
Below is our plan:  We will continue current treatment plan. Take Nurtec 1 tablet daily for the next 5 days. I have given you 6 sample tablets and called a prescription to your pharmacy. Please take Nurtec daily with Tyelnol 1000mg  and Benadryl 25-50mg . Rest and complementary therapies as discussed.   To prevent or relieve headaches, try the following:  Cool Compress. Lie down and place a cool compress on your head.  Avoid headache triggers. If certain foods or odors seem to have triggered your migraines in the past, avoid them. A headache diary might help you identify triggers.  Include physical activity in your daily routine. Try a daily walk or other moderate aerobic exercise.  Manage stress. Find healthy ways to cope with the stressors, such as delegating tasks on your to-do list.  Practice relaxation techniques. Try deep breathing, yoga, massage and visualization.  Eat regularly. Eating regularly scheduled meals and maintaining a healthy diet might help prevent headaches. Also, drink plenty of fluids.  Follow a regular sleep schedule. Sleep deprivation might contribute to headaches Consider biofeedback. With this mind-body technique, you learn to control certain bodily functions -- such as muscle tension, heart rate and blood pressure -- to prevent headaches or reduce headache pain.      Proceed to emergency room if you experience new or worsening symptoms or symptoms do not resolve, if you have new neurologic symptoms or if headache is severe, or for any concerning symptom.    Please make sure you are staying well hydrated. I recommend 50-60 ounces daily. Well balanced diet and regular exercise encouraged. Consistent sleep schedule with 6-8 hours recommended.   Please continue follow up with care team as directed.   Follow up with me in 6 months   You may receive a survey regarding today's visit. I encourage you to leave honest feed back as I do use this information to improve patient  care. Thank you for seeing me today!

## 2021-04-10 NOTE — Telephone Encounter (Signed)
PA submitted through [facility] KEY: [id number] Awaiting Berkley Harvey

## 2021-04-10 NOTE — Progress Notes (Signed)
Chief Complaint  Patient presents with   Follow-up    RM 1, alone. Last seen 03/20/21 for botox. Having worsening headache pain since this.    HISTORY OF PRESENT ILLNESS:  04/10/21 ALL:  Connie Arnold is a 55 y.o. female here today for follow up for acute visit for prolonged migraines. She has a history of migrainous headaches with intermittent complex symptoms of vision loss, numbness and tingling. She reports having a migraine that started about 5 days. She called yesterday to report continued headaches following medrol dose pack prescribed by PCP 1 week ago for low back pain. She described tingling/pulling sensation globally. She has nausea and light sensitivity. She has been taking oxycodone and Tylenol with minimal relief. She has had more neck and shoulder pain as well. She has tried heat and rest with minimal relief. Last Botox 03/20/2021. Insurance would not approve CGRP. She also continues topiramate 200mg  BID. She is on duloxetine 60mg  daily for fibromyalgia. Trazodone 200mg  daily for insomnia. Phentermine for weight loss. She has taken this on and off for several years. She is uncertain of any specific changes that could contribute. She reports allergy to triptans. She broke out in hives with "an injection" of triptans.     REVIEW OF SYSTEMS: Out of a complete 14 system review of symptoms, the patient complains only of the following symptoms, headaches and all other reviewed systems are negative.   ALLERGIES: Allergies  Allergen Reactions   Nsaids Other (See Comments)    Chronic kidney failure   Codeine    Ramipril    Imitrex [Sumatriptan] Nausea And Vomiting     HOME MEDICATIONS: Outpatient Medications Prior to Visit  Medication Sig Dispense Refill   ADVAIR DISKUS 250-50 MCG/DOSE AEPB Inhale 1 puff into the lungs as needed.      albuterol (PROVENTIL HFA;VENTOLIN HFA) 108 (90 Base) MCG/ACT inhaler Inhale into the lungs.     albuterol (PROVENTIL) (2.5 MG/3ML) 0.083%  nebulizer solution      Azelastine-Fluticasone 137-50 MCG/ACT SUSP Place 1 spray into the nose as needed.      baclofen (LIORESAL) 10 MG tablet TAKE 1 TO 2 TABLETS BY  MOUTH 3 TIMES DAILY 540 tablet 3   Belimumab 200 MG/ML SOAJ Inject 1 Dose into the skin once a week.      bupivacaine (MARCAINE PRESERVATIVE FREE) 0.5 % SOLN injection      cetirizine (ZYRTEC) 10 MG tablet Take 10 mg by mouth daily.      DULoxetine (CYMBALTA) 60 MG capsule Take 1 capsule (60 mg total) by mouth daily. 90 capsule 4   fluconazole (DIFLUCAN) 150 MG tablet Take 150 mg by mouth once.      FLUoxetine (PROZAC) 40 MG capsule Take 40 mg by mouth daily.      furosemide (LASIX) 40 MG tablet Take 40 mg by mouth daily.      glucose blood test strip      heparin UNIT/ML injection Instill 4 cc into bladder 2x daily     hydroxychloroquine (PLAQUENIL) 200 MG tablet Take 1 tablet (200 mg total) by mouth 2 (two) times daily. 30 tablet 0   ipratropium-albuterol (DUONEB) 0.5-2.5 (3) MG/3ML SOLN 3 mLs as needed.      lactulose, encephalopathy, (CHRONULAC) 10 GM/15ML SOLN daily as needed.      montelukast (SINGULAIR) 10 MG tablet Take 10 mg by mouth at bedtime.      oxyCODONE (ROXICODONE) 15 MG immediate release tablet Take 1 tablet (15 mg  total) by mouth every 8 (eight) hours as needed. for pain 90 tablet 0   phentermine 37.5 MG capsule Take 1 capsule by mouth once daily in the morning 30 capsule 5   predniSONE (DELTASONE) 20 MG tablet Take 2 tablets (40 mg total) by mouth daily. 10 tablet 0   promethazine (PHENERGAN) 50 MG/ML injection Inject into the muscle as needed.      topiramate (TOPAMAX) 200 MG tablet TAKE 1 TABLET BY MOUTH  TWICE DAILY 180 tablet 3   traZODone (DESYREL) 100 MG tablet Take 2 tablets (200 mg total) by mouth at bedtime. 90 tablet 3   triamcinolone (NASACORT) 55 MCG/ACT AERO nasal inhaler Place 2 sprays into the nose.     trimethoprim (TRIMPEX) 100 MG tablet Take 1 tablet by mouth  daily     Vitamin D,  Ergocalciferol, (DRISDOL) 50000 UNITS CAPS capsule Take 50,000 Units by mouth every 7 (seven) days.      No facility-administered medications prior to visit.     PAST MEDICAL HISTORY: Past Medical History:  Diagnosis Date   Asthma    Carpal tunnel syndrome 09/29/2014   Chronic back pain    Chronic kidney disease    Diabetes mellitus without complication (HCC)    Fatty infiltration of liver 03/17/2015   Fibromyalgia    Hypertension    IBS (irritable bowel syndrome)    Interstitial cystitis    Lupus (HCC)    Lupus (systemic lupus erythematosus) (HCC)    Migraines    Pseudotumor cerebri      PAST SURGICAL HISTORY: Past Surgical History:  Procedure Laterality Date   ABDOMINAL HYSTERECTOMY     APPENDECTOMY     CHOLECYSTECTOMY     CYSTOSCOPY       FAMILY HISTORY: Family History  Problem Relation Age of Onset   Graves' disease Mother    Heart disease Father    Prostate cancer Father    Healthy Brother    Healthy Maternal Aunt    Healthy Maternal Uncle      SOCIAL HISTORY: Social History   Socioeconomic History   Marital status: Married    Spouse name: Not on file   Number of children: Not on file   Years of education: Not on file   Highest education level: Not on file  Occupational History   Not on file  Tobacco Use   Smoking status: Never   Smokeless tobacco: Never  Vaping Use   Vaping Use: Never used  Substance and Sexual Activity   Alcohol use: No   Drug use: No   Sexual activity: Not on file  Other Topics Concern   Not on file  Social History Narrative   Not on file   Social Determinants of Health   Financial Resource Strain: Not on file  Food Insecurity: Not on file  Transportation Needs: Not on file  Physical Activity: Not on file  Stress: Not on file  Social Connections: Not on file  Intimate Partner Violence: Not on file     PHYSICAL EXAM  Vitals:   04/10/21 1059  BP: (!) 145/91  Pulse: 76  Weight: 213 lb (96.6 kg)  Height:  5\' 5"  (1.651 m)   Body mass index is 35.45 kg/m.  Generalized: Well developed, in no acute distress  Cardiology: normal rate and rhythm, no murmur auscultated  Respiratory: clear to auscultation bilaterally    Neurological examination  Mentation: Alert oriented to time, place, history taking. Follows all commands speech and language fluent Cranial  nerve II-XII: Pupils were equal round reactive to light. Extraocular movements were full, visual field were full on confrontational test. Facial sensation and strength were normal. Head turning and shoulder shrug  were normal and symmetric. Motor: The motor testing reveals 5 over 5 strength of all 4 extremities. Good symmetric motor tone is noted throughout.  Sensory: Sensory testing is intact to soft touch on all 4 extremities. No evidence of extinction is noted.  Coordination: Cerebellar testing reveals good finger-nose-finger and heel-to-shin bilaterally.  Gait and station: Gait is normal.  Reflexes: Deep tendon reflexes are symmetric and normal bilaterally.    DIAGNOSTIC DATA (LABS, IMAGING, TESTING) - I reviewed patient records, labs, notes, testing and imaging myself where available.  Lab Results  Component Value Date   WBC 5.8 10/30/2020   HGB 13.2 10/30/2020   HCT 39.2 10/30/2020   MCV 86.9 10/30/2020   PLT 267 10/30/2020      Component Value Date/Time   NA 137 10/30/2020 2218   K 3.8 10/30/2020 2218   CL 110 10/30/2020 2218   CO2 22 10/30/2020 2218   GLUCOSE 115 (H) 10/30/2020 2218   BUN 22 (H) 10/30/2020 2218   CREATININE 0.99 10/30/2020 2218   CALCIUM 8.7 (L) 10/30/2020 2218   PROT 6.9 09/09/2020 1152   ALBUMIN 3.9 09/09/2020 1152   AST 66 (H) 09/09/2020 1152   ALT 17 09/09/2020 1152   ALKPHOS 89 09/09/2020 1152   BILITOT 0.8 09/09/2020 1152   GFRNONAA >60 10/30/2020 2218   GFRAA >60 02/19/2015 0510   No results found for: CHOL, HDL, LDLCALC, LDLDIRECT, TRIG, CHOLHDL No results found for: SVXB9T No results  found for: VITAMINB12 No results found for: TSH  No flowsheet data found.   No flowsheet data found.   ASSESSMENT AND PLAN  55 y.o. year old female  has a past medical history of Asthma, Carpal tunnel syndrome (09/29/2014), Chronic back pain, Chronic kidney disease, Diabetes mellitus without complication (HCC), Fatty infiltration of liver (03/17/2015), Fibromyalgia, Hypertension, IBS (irritable bowel syndrome), Interstitial cystitis, Lupus (HCC), Lupus (systemic lupus erythematosus) (HCC), Migraines, and Pseudotumor cerebri. here with    Chronic migraine without aura without status migrainosus, not intractable  Fibromyalgia  Insomnia, unspecified type  Neck pain  Bryannah has experienced a prolonged migraine with complex symptoms for the past 5 days. Migraine is reportedly similar to previous migraines. Neuro exam is unremarkable. I will have her try Nurtec daily for 5 days with Tylenol 1000mg  anf Benadry 25-50mg . Complementary therapies advised. She was given 6 sample tablets and rx sent to pharmacy for prn use. She will focus on healthy lifestyle habits. She will seek emergency medical attention immediately for any worsening symptoms. S/S of stroke reviewed. She will follow up as scheduled for Botox with Dr Epimenio Foot. She will return to see me in 6 months if needed.    No orders of the defined types were placed in this encounter.    Meds ordered this encounter  Medications   Rimegepant Sulfate (NURTEC) 75 MG TBDP    Sig: Take 75 mg by mouth daily as needed (take for abortive therapy of migraine, no more than 1 tablet in 24 hours or 10 per month).    Dispense:  8 tablet    Refill:  11    Order Specific Question:   Supervising Provider    Answer:   Anson Fret [[id number]]   Rimegepant Sulfate (NURTEC) 75 MG TBDP    Sig: Take 75 mg by mouth daily as needed (  take for abortive therapy of migraine, no more than 1 tablet in 24 hours or 10 per month).    Dispense:  6 tablet    Refill:   0    Order Specific Question:   Supervising Provider    Answer:   Anson Fret [[id number]]       HUD JSHFW, MSN, FNP-C 04/10/2021, 12:55 PM  [facility] [address], [address] [place], [place] [address] [phone]

## 2021-04-11 NOTE — Telephone Encounter (Signed)
PA was denied and appeal was initiated on CMM. Appeal was completed on [facility]

## 2021-04-19 ENCOUNTER — Other Ambulatory Visit: Payer: Self-pay | Admitting: Neurology

## 2021-04-19 NOTE — Telephone Encounter (Signed)
Received refill request for Oxycodone.  Last OV was on 04/10/21.  Next OV is scheduled for 06/19/21 .  Last RX was written on 03/20/21 for 90 tabs.   [place] [facility] has been reviewed.

## 2021-05-21 ENCOUNTER — Other Ambulatory Visit: Payer: Self-pay | Admitting: Neurology

## 2021-05-24 ENCOUNTER — Encounter (HOSPITAL_BASED_OUTPATIENT_CLINIC_OR_DEPARTMENT_OTHER): Payer: Self-pay | Admitting: *Deleted

## 2021-05-24 ENCOUNTER — Emergency Department (HOSPITAL_BASED_OUTPATIENT_CLINIC_OR_DEPARTMENT_OTHER): Payer: Medicare Other

## 2021-05-24 ENCOUNTER — Other Ambulatory Visit: Payer: Self-pay

## 2021-05-24 ENCOUNTER — Emergency Department (HOSPITAL_BASED_OUTPATIENT_CLINIC_OR_DEPARTMENT_OTHER)
Admission: EM | Admit: 2021-05-24 | Discharge: 2021-05-25 | Disposition: A | Discharge status: Home / Self Care | Payer: Medicare Other | Attending: Emergency Medicine | Admitting: Emergency Medicine

## 2021-05-24 ENCOUNTER — Ambulatory Visit
Admission: EM | Admit: 2021-05-24 | Discharge: 2021-05-24 | Disposition: A | Discharge status: Home / Self Care | Payer: Medicare Other

## 2021-05-24 DIAGNOSIS — R110 Nausea: Secondary | ICD-10-CM | POA: Diagnosis not present

## 2021-05-24 DIAGNOSIS — R059 Cough, unspecified: Secondary | ICD-10-CM | POA: Insufficient documentation

## 2021-05-24 DIAGNOSIS — R072 Precordial pain: Secondary | ICD-10-CM | POA: Insufficient documentation

## 2021-05-24 DIAGNOSIS — Z79899 Other long term (current) drug therapy: Secondary | ICD-10-CM | POA: Diagnosis not present

## 2021-05-24 DIAGNOSIS — R079 Chest pain, unspecified: Secondary | ICD-10-CM | POA: Diagnosis not present

## 2021-05-24 DIAGNOSIS — R0602 Shortness of breath: Secondary | ICD-10-CM

## 2021-05-24 DIAGNOSIS — R42 Dizziness and giddiness: Secondary | ICD-10-CM | POA: Insufficient documentation

## 2021-05-24 DIAGNOSIS — Z20822 Contact with and (suspected) exposure to covid-19: Secondary | ICD-10-CM | POA: Diagnosis not present

## 2021-05-24 DIAGNOSIS — R0789 Other chest pain: Secondary | ICD-10-CM

## 2021-05-24 LAB — CBC WITH DIFFERENTIAL/PLATELET
Abs Immature Granulocytes: 0.02 10*3/uL (ref 0.00–0.07)
Basophils Absolute: 0 10*3/uL (ref 0.0–0.1)
Basophils Relative: 1 %
Eosinophils Absolute: 0.4 10*3/uL (ref 0.0–0.5)
Eosinophils Relative: 6 %
HCT: 42.9 % (ref 36.0–46.0)
Hemoglobin: 14 g/dL (ref 12.0–15.0)
Immature Granulocytes: 0 %
Lymphocytes Relative: 51 %
Lymphs Abs: 3.9 10*3/uL (ref 0.7–4.0)
MCH: 29.4 pg (ref 26.0–34.0)
MCHC: 32.6 g/dL (ref 30.0–36.0)
MCV: 89.9 fL (ref 80.0–100.0)
Monocytes Absolute: 0.7 10*3/uL (ref 0.1–1.0)
Monocytes Relative: 9 %
Neutro Abs: 2.5 10*3/uL (ref 1.7–7.7)
Neutrophils Relative %: 33 %
Platelets: 214 10*3/uL (ref 150–400)
RBC: 4.77 MIL/uL (ref 3.87–5.11)
RDW: 14.1 % (ref 11.5–15.5)
WBC: 7.5 10*3/uL (ref 4.0–10.5)
nRBC: 0 % (ref 0.0–0.2)

## 2021-05-24 LAB — COMPREHENSIVE METABOLIC PANEL
ALT: 23 U/L (ref 0–44)
AST: 79 U/L — ABNORMAL HIGH (ref 15–41)
Albumin: 3.7 g/dL (ref 3.5–5.0)
Alkaline Phosphatase: 105 U/L (ref 38–126)
Anion gap: 9 (ref 5–15)
BUN: 17 mg/dL (ref 6–20)
CO2: 23 mmol/L (ref 22–32)
Calcium: 8.9 mg/dL (ref 8.9–10.3)
Chloride: 105 mmol/L (ref 98–111)
Creatinine, Ser: 0.7 mg/dL (ref 0.44–1.00)
GFR, Estimated: >60 mL/min (ref >60)
Glucose, Bld: 79 mg/dL (ref 70–99)
Potassium: 4.2 mmol/L (ref 3.5–5.1)
Sodium: 137 mmol/L (ref 135–145)
Total Bilirubin: 0.4 mg/dL (ref 0.3–1.2)
Total Protein: 7.4 g/dL (ref 6.5–8.1)

## 2021-05-24 LAB — TROPONIN I (HIGH SENSITIVITY)
Troponin I (High Sensitivity): 3 ng/L (ref <18)
Troponin I (High Sensitivity): 3 ng/L (ref <18)

## 2021-05-24 LAB — RESP PANEL BY RT-PCR (FLU A&B, COVID) ARPGX2
Influenza A by PCR: NEGATIVE
Influenza B by PCR: NEGATIVE
SARS Coronavirus 2 by RT PCR: NEGATIVE

## 2021-05-24 LAB — MAGNESIUM: Magnesium: 2.2 mg/dL (ref 1.7–2.4)

## 2021-05-24 MED ORDER — PREDNISONE 20 MG PO TABS: 40.0000 mg | ORAL_TABLET | Freq: Every day | ORAL | 0 refills | Status: AC

## 2021-05-24 NOTE — Discharge Instructions (Signed)
Go to the emergency room for further evaluation and management as we discussed. 

## 2021-05-24 NOTE — ED Notes (Addendum)
Patient is being discharged from the Urgent Care and sent to the Emergency Department via POA with family. Per E. Raspet PA-C, patient is in need of higher level of care due to Further evaluation. Patient is aware and verbalizes understanding of plan of care.  Vitals:   05/24/21 1546  BP: (!) 144/87  Pulse: (!) 59  Resp: 18  Temp: 98 F (36.7 C)  SpO2: 99%

## 2021-05-24 NOTE — ED Triage Notes (Signed)
Pt c/o SOB, lightheadedness, medial chest pain (sharp)- does not radiate, tachycardia, body aches and chills since Monday. Pt a&o x4 and ambulatory.

## 2021-05-24 NOTE — ED Triage Notes (Signed)
She was seen at [facility] today for sob, lightheadedness, sharp chest pain. Body aches chills x 3 days. Pt is ambulatory. EKG and nasal swab at triage.

## 2021-05-24 NOTE — ED Notes (Signed)
Pt. Reports feeling like she has an upper resp. Cold.  Pt. Has no cough or fever at the present time.  Pt. Has no report of runny nose.  Pt. Reports she has been feeling tired.  Pt. Reports chest pain generalized with no L sided chest pain specific to her visit.

## 2021-05-24 NOTE — ED Notes (Signed)
Pt transported to xray 

## 2021-05-24 NOTE — ED Provider Notes (Signed)
[facility]    CSN: [id number] Arrival date & time: 05/24/21  1529      History   Chief Complaint Chief Complaint  Patient presents with   Chest Pain   Shortness of Breath    HPI Connie Arnold is a 56 y.o. female.   Patient presents today with a several day history of intermittent chest pain.  She reports pain ranges from 3-6 on a 0-10 pain scale, localized to left chest without radiation, described as pressure, worse with activity, improved with rest.  She reports associated nausea, intermittent lightheadedness, shortness of breath.  Denies any syncopal episode or weakness.  She does report an occasional cough but denies additional URI symptoms.  She does have a history of asthma and has been using albuterol without improvement.  She denies history of cardiovascular disease but has had TIA.  She has a history of diabetes, hypertension, hyperlipidemia as well as family history of heart disease.  She does not smoke.  She has not tried any over-the-counter medication for symptom management.   Past Medical History:  Diagnosis Date   Asthma    Carpal tunnel syndrome 09/29/2014   Chronic back pain    Chronic kidney disease    Diabetes mellitus without complication (HCC)    Fatty infiltration of liver 03/17/2015   Fibromyalgia    Hypertension    IBS (irritable bowel syndrome)    Interstitial cystitis    Lupus ([place])    Lupus (systemic lupus erythematosus) ([place])    Migraines    Pseudotumor cerebri     Patient Active Problem List   Diagnosis Date Noted   TIA (transient ischemic attack) 04/15/2016   Chronic, continuous use of opioids 01/10/2016   Chronic constipation 06/27/2015   Headache, migraine 06/27/2015   Diabetes mellitus without complication ([place]) [id number]   Benign essential HTN 06/27/2015   Encounter for immunization 06/27/2015   Asthma, moderate persistent 06/27/2015   Avitaminosis D 06/27/2015   Abnormal finding in urine 06/13/2015   Medication  overuse headache 03/29/2015   Benign intracranial hypertension 03/29/2015   Fatty infiltration of liver 03/17/2015   Insomnia 11/10/2014   Depression 11/10/2014   Migraine without aura, intractable 10/24/2014   Tingling 09/29/2014   Carpal tunnel syndrome 09/29/2014   Cervical disc disorder with radiculopathy of cervical region 08/25/2014   Bursitis, subacromial 08/25/2014   Idiopathic intracranial hypertension 08/10/2014   Chronic migraine without aura without status migrainosus, not intractable 08/10/2014   Neck pain 08/10/2014   Chronic migraine w/o aura w/o status migrainosus, not intractable 06/01/2014   Depression, major, severe recurrence ([place]) 11/01/2013   Severe episode of recurrent major depressive disorder ([place]) 11/01/2013   Blood in the urine 04/29/2013   Abnormal presence of protein in urine 04/29/2013   Chronic interstitial cystitis 11/16/2011   Infection of urinary tract 11/16/2011   Fibromyalgia 05/06/2011   Other long term (current) drug therapy 05/06/2011   Systemic lupus erythematosus ([place]) 05/06/2011   MORBID OBESITY 10/22/2007   SHORTNESS OF BREATH (SOB) 10/22/2007   CORONARY HEART DISEASE 10/21/2007   ALLERGIC RHINITIS 10/21/2007    Past Surgical History:  Procedure Laterality Date   ABDOMINAL HYSTERECTOMY     APPENDECTOMY     CHOLECYSTECTOMY     CYSTOSCOPY      OB History   No obstetric history on file.      Home Medications    Prior to Admission medications   Medication Sig Start Date End Date Taking? Authorizing Provider  ADVAIR  DISKUS 250-50 MCG/DOSE AEPB Inhale 1 puff into the lungs as needed.  04/16/15   [provider]  albuterol (PROVENTIL HFA;VENTOLIN HFA) 108 (90 Base) MCG/ACT inhaler Inhale into the lungs. 10/21/16   [provider]  albuterol (PROVENTIL) (2.5 MG/3ML) 0.083% nebulizer solution  07/27/14   [provider]  Azelastine-Fluticasone 137-50 MCG/ACT SUSP Place 1 spray into the nose as needed.      [provider]  baclofen (LIORESAL) 10 MG tablet TAKE 1 TO 2 TABLETS BY  MOUTH 3 TIMES DAILY 02/20/21   Sater, Nanine Means, MD  Belimumab 200 MG/ML SOAJ Inject 1 Dose into the skin once a week.     [provider]  bupivacaine (MARCAINE PRESERVATIVE FREE) 0.5 % SOLN injection  02/21/15   [provider]  cetirizine (ZYRTEC) 10 MG tablet Take 10 mg by mouth daily.     [provider]  DULoxetine (CYMBALTA) 60 MG capsule Take 1 capsule (60 mg total) by mouth daily. 03/09/18   Sater, Nanine Means, MD  fluconazole (DIFLUCAN) 150 MG tablet Take 150 mg by mouth once.     [provider]  FLUoxetine (PROZAC) 40 MG capsule Take 40 mg by mouth daily.  11/01/16   [provider]  furosemide (LASIX) 40 MG tablet Take 40 mg by mouth daily.  08/05/14   [provider]  glucose blood test strip  11/24/14   [provider]  heparin 10000 UNIT/ML injection Instill 4 cc into bladder 2x daily 11/29/14   [provider]  hydroxychloroquine (PLAQUENIL) 200 MG tablet Take 1 tablet (200 mg total) by mouth 2 (two) times daily. 01/05/14   Glendell Docker, NP  ipratropium-albuterol (DUONEB) 0.5-2.5 (3) MG/3ML SOLN 3 mLs as needed.     [provider]  lactulose, encephalopathy, (CHRONULAC) 10 GM/15ML SOLN daily as needed.  08/26/14   [provider]  montelukast (SINGULAIR) 10 MG tablet Take 10 mg by mouth at bedtime.  11/01/16   [provider]  oxyCODONE (ROXICODONE) 15 MG immediate release tablet TAKE ONE TABLET BY MOUTH EVERY EIGHT HOURS AS NEEDED FOR PAIN 05/22/21   Sater, Nanine Means, MD  phentermine 37.5 MG capsule Take 1 capsule by mouth once daily in the morning 03/20/21   Sater, Nanine Means, MD  predniSONE (DELTASONE) 20 MG tablet Take 2 tablets (40 mg total) by mouth daily. 09/09/20   Blanchie Dessert, MD  promethazine (PHENERGAN) 50 MG/ML injection Inject into the muscle as needed.  03/03/09   [provider]   Rimegepant Sulfate (NURTEC) 75 MG TBDP Take 75 mg by mouth daily as needed (take for abortive therapy of migraine, no more than 1 tablet in 24 hours or 10 per month). 04/10/21   Lomax, Amy, NP  Rimegepant Sulfate (NURTEC) 75 MG TBDP Take 75 mg by mouth daily as needed (take for abortive therapy of migraine, no more than 1 tablet in 24 hours or 10 per month). 04/10/21   Lomax, Amy, NP  topiramate (TOPAMAX) 200 MG tablet TAKE 1 TABLET BY MOUTH  TWICE DAILY 02/20/21   Sater, Nanine Means, MD  traZODone (DESYREL) 100 MG tablet Take 2 tablets (200 mg total) by mouth at bedtime. 05/22/21   Sater, Nanine Means, MD  triamcinolone (NASACORT) 55 MCG/ACT AERO nasal inhaler Place 2 sprays into the nose.    [provider]  trimethoprim (TRIMPEX) 100 MG tablet Take 1 tablet by mouth  daily 10/27/14   [provider]  Vitamin D, Ergocalciferol, (DRISDOL) 50000  UNITS CAPS capsule Take 50,000 Units by mouth every 7 (seven) days.     [provider]    Family History Family History  Problem Relation Age of Onset   Graves' disease Mother    Heart disease Father    Prostate cancer Father    Healthy Brother    Healthy Maternal Aunt    Healthy Maternal Uncle     Social History Social History   Tobacco Use   Smoking status: Never   Smokeless tobacco: Never  Vaping Use   Vaping Use: Never used  Substance Use Topics   Alcohol use: No   Drug use: No     Allergies   Nsaids, Codeine, Ramipril, and Imitrex [sumatriptan]   Review of Systems Review of Systems  Constitutional:  Positive for activity change and fatigue. Negative for appetite change and fever.  Respiratory:  Positive for cough and shortness of breath.   Cardiovascular:  Positive for chest pain and palpitations.  Gastrointestinal:  Positive for nausea. Negative for abdominal pain, diarrhea and vomiting.  Musculoskeletal:  Negative for arthralgias and myalgias.  Neurological:  Positive for light-headedness. Negative for  dizziness, syncope, weakness and headaches.    Physical Exam Triage Vital Signs ED Triage Vitals  Enc Vitals Group     BP 05/24/21 1546 (!) 144/87     Pulse Rate 05/24/21 1546 (!) 59     Resp 05/24/21 1546 18     Temp 05/24/21 1546 98 F (36.7 C)     Temp Source 05/24/21 1546 Oral     SpO2 05/24/21 1546 99 %     Weight --      Height --      Head Circumference --      Peak Flow --      Pain Score 05/24/21 1558 5     Pain Loc --      Pain Edu? --      Excl. in [place]? --    No data found.  Updated Vital Signs BP (!) 144/87 (BP Location: Right Arm)    Pulse (!) 59    Temp 98 F (36.7 C) (Oral)    Resp 18    SpO2 99%   Visual Acuity Right Eye Distance:   Left Eye Distance:   Bilateral Distance:    Right Eye Near:   Left Eye Near:    Bilateral Near:     Physical Exam Vitals reviewed.  Constitutional:      General: She is awake. She is not in acute distress.    Appearance: Normal appearance. She is well-developed. She is not ill-appearing.     Comments: Very pleasant female appears at age in no acute distress sitting comfortably in exam room  HENT:     Head: Normocephalic and atraumatic.  Cardiovascular:     Rate and Rhythm: Normal rate and regular rhythm.     Heart sounds: Normal heart sounds, S1 normal and S2 normal. No murmur heard. Pulmonary:     Effort: Pulmonary effort is normal.     Breath sounds: Normal breath sounds. No wheezing, rhonchi or rales.     Comments: Clear to auscultation bilaterally Chest:     Chest wall: No deformity, swelling or tenderness.  Abdominal:     Tenderness: There is no abdominal tenderness.  Musculoskeletal:     Right lower leg: No edema.     Left lower leg: No edema.  Psychiatric:        Behavior: Behavior is cooperative.  UC Treatments / Results  Labs (all labs ordered are listed, but only abnormal results are displayed) Labs Reviewed - No data to display  EKG   Radiology No results  found.  Procedures Procedures (including critical care time)  Medications Ordered in UC Medications - No data to display  Initial Impression / Assessment and Plan / UC Course  I have reviewed the triage vital signs and the nursing notes.  Pertinent labs & imaging results that were available during my care of the patient were reviewed by me and considered in my medical decision making (see chart for details).     EKG obtained showed inverted P waves in inferior leads as well as nonspecific ST changes in aVF compared to 09/14/2020 tracing without ischemic changes.  Vital signs stable.  Pain was not reproducible on exam I discussed that she has several risk factors for cardiovascular disease.  Given current chest pain recommended she go to the emergency room for further evaluation and management.  Patient was hesitant but agreeable.  Her significant other will drive her directly to the closest emergency room for further evaluation and management.  Her vital signs are stable at the time of discharge and she is safe for private transport.  Discussed that if she has any syncopal episode or sudden worsening of chest pain they should call 911.   Final Clinical Impressions(s) / UC Diagnoses   Final diagnoses:  Chest pain, unspecified type  Shortness of breath  Lightheadedness  Nausea without vomiting     Discharge Instructions      Go to the emergency room for further evaluation and management as we discussed.     ED Prescriptions   None    PDMP not reviewed this encounter.   Terrilee Croak, PA-C 05/24/21 1619

## 2021-05-24 NOTE — ED Provider Notes (Signed)
[facility] EMERGENCY DEPARTMENT Provider Note   CSN: [id number] Arrival date & time: 05/24/21  1638     History  Chief Complaint  Patient presents with   URI    Connie Arnold is a 56 y.o. female.  56 y.o female with a PMH of TIA presents to the ED with a chief complaint of shortness of breath, lightheadedness, sharp chest pain has been ongoing for the past 3 days.  She describes as a dull sensation to the substernal area radiating through her whole chest, this has been intermittent and ongoing for the past 3 days.  Exacerbated with breathing along with movement.  No improvement despite rest along with aspirin.  No similar episodes in the past.  Evaluated at urgent care but sent here due to irregular EKG, along with strong family history.  Father with a prior history of an MI, mother with a prior pacemaker placement.  She also noted some shortness of breath, that occurs when pain begins, she also stresses her heart is racing.  No fever, I dry cough, no chills, no leg swelling.   The history is provided by the patient and medical records.  URI Presenting symptoms: cough   Presenting symptoms: no fever and no sore throat   Chest Pain Pain location:  Substernal area Pain quality: dull   Pain radiates to:  Precordial region Pain severity:  Moderate Onset quality:  Gradual Duration:  3 days Timing:  Intermittent Progression:  Worsening Chronicity:  New Context: breathing and movement   Relieved by:  Nothing Worsened by:  Movement Ineffective treatments:  Rest and aspirin Associated symptoms: cough and shortness of breath   Associated symptoms: no abdominal pain, no back pain, no fever, no nausea and no vomiting       Home Medications Prior to Admission medications   Medication Sig Start Date End Date Taking? Authorizing Provider  predniSONE (DELTASONE) 20 MG tablet Take 2 tablets (40 mg total) by mouth daily for 5 days. 05/24/21 05/29/21 Yes Raymund Manrique, PA-C   ADVAIR DISKUS 250-50 MCG/DOSE AEPB Inhale 1 puff into the lungs as needed.  04/16/15   [provider]  albuterol (PROVENTIL HFA;VENTOLIN HFA) 108 (90 Base) MCG/ACT inhaler Inhale into the lungs. 10/21/16   [provider]  albuterol (PROVENTIL) (2.5 MG/3ML) 0.083% nebulizer solution  07/27/14   [provider]  Azelastine-Fluticasone 137-50 MCG/ACT SUSP Place 1 spray into the nose as needed.     [provider]  baclofen (LIORESAL) 10 MG tablet TAKE 1 TO 2 TABLETS BY  MOUTH 3 TIMES DAILY 02/20/21   Sater, Pearletha Furl, MD  Belimumab 200 MG/ML SOAJ Inject 1 Dose into the skin once a week.     [provider]  bupivacaine (MARCAINE PRESERVATIVE FREE) 0.5 % SOLN injection  02/21/15   [provider]  cetirizine (ZYRTEC) 10 MG tablet Take 10 mg by mouth daily.     [provider]  DULoxetine (CYMBALTA) 60 MG capsule Take 1 capsule (60 mg total) by mouth daily. 03/09/18   Sater, Pearletha Furl, MD  fluconazole (DIFLUCAN) 150 MG tablet Take 150 mg by mouth once.     [provider]  FLUoxetine (PROZAC) 40 MG capsule Take 40 mg by mouth daily.  11/01/16   [provider]  furosemide (LASIX) 40 MG tablet Take 40 mg by mouth daily.  08/05/14   [provider]  glucose blood test strip  11/24/14   [provider]  heparin 96789 UNIT/ML  injection Instill 4 cc into bladder 2x daily 11/29/14   [provider]  hydroxychloroquine (PLAQUENIL) 200 MG tablet Take 1 tablet (200 mg total) by mouth 2 (two) times daily. 01/05/14   Teressa Lower, NP  ipratropium-albuterol (DUONEB) 0.5-2.5 (3) MG/3ML SOLN 3 mLs as needed.     [provider]  lactulose, encephalopathy, (CHRONULAC) 10 GM/15ML SOLN daily as needed.  08/26/14   [provider]  montelukast (SINGULAIR) 10 MG tablet Take 10 mg by mouth at bedtime.  11/01/16   [provider]  oxyCODONE (ROXICODONE) 15 MG immediate release tablet TAKE ONE  TABLET BY MOUTH EVERY EIGHT HOURS AS NEEDED FOR PAIN 05/22/21   Sater, Pearletha Furl, MD  phentermine 37.5 MG capsule Take 1 capsule by mouth once daily in the morning 03/20/21   Sater, Pearletha Furl, MD  promethazine (PHENERGAN) 50 MG/ML injection Inject into the muscle as needed.  03/03/09   [provider]  Rimegepant Sulfate (NURTEC) 75 MG TBDP Take 75 mg by mouth daily as needed (take for abortive therapy of migraine, no more than 1 tablet in 24 hours or 10 per month). 04/10/21   Lomax, Amy, NP  Rimegepant Sulfate (NURTEC) 75 MG TBDP Take 75 mg by mouth daily as needed (take for abortive therapy of migraine, no more than 1 tablet in 24 hours or 10 per month). 04/10/21   Lomax, Amy, NP  topiramate (TOPAMAX) 200 MG tablet TAKE 1 TABLET BY MOUTH  TWICE DAILY 02/20/21   Sater, Pearletha Furl, MD  traZODone (DESYREL) 100 MG tablet Take 2 tablets (200 mg total) by mouth at bedtime. 05/22/21   Sater, Pearletha Furl, MD  triamcinolone (NASACORT) 55 MCG/ACT AERO nasal inhaler Place 2 sprays into the nose.    [provider]  trimethoprim (TRIMPEX) 100 MG tablet Take 1 tablet by mouth  daily 10/27/14   [provider]  Vitamin D, Ergocalciferol, (DRISDOL) 50000 UNITS CAPS capsule Take 50,000 Units by mouth every 7 (seven) days.     [provider]      Allergies    Nsaids, Codeine, Ramipril, and Imitrex [sumatriptan]    Review of Systems   Review of Systems  Constitutional:  Negative for chills and fever.  HENT:  Negative for sore throat.   Respiratory:  Positive for cough and shortness of breath.   Cardiovascular:  Positive for chest pain.  Gastrointestinal:  Negative for abdominal pain, nausea and vomiting.  Genitourinary:  Negative for flank pain.  Musculoskeletal:  Negative for back pain.  All other systems reviewed and are negative.  Physical Exam Updated Vital Signs BP (!) 144/77 (BP Location: Right Arm)    Pulse 64    Temp 98.6 F (37 C) (Oral)    Resp 16    Ht  (1.651  m)    Wt 96.6 kg    SpO2 100%    BMI 35.44 kg/m  Physical Exam Vitals and nursing note reviewed.  Constitutional:      General: She is not in acute distress.    Appearance: She is well-developed.  HENT:     Head: Normocephalic and atraumatic.     Mouth/Throat:     Pharynx: No oropharyngeal exudate.  Eyes:     Pupils: Pupils are equal, round, and reactive to light.  Cardiovascular:     Rate and Rhythm: Regular rhythm.     Heart sounds: Normal heart sounds.  Pulmonary:     Effort: Pulmonary effort is normal. No respiratory distress.  Breath sounds: Normal breath sounds.  Abdominal:     General: Bowel sounds are normal. There is no distension.     Palpations: Abdomen is soft.     Tenderness: There is no abdominal tenderness.  Musculoskeletal:        General: No tenderness or deformity.     Cervical back: Normal range of motion.     Right lower leg: No edema.     Left lower leg: No edema.  Skin:    General: Skin is warm and dry.  Neurological:     Mental Status: She is alert and oriented to person, place, and time.    ED Results / Procedures / Treatments   Labs (all labs ordered are listed, but only abnormal results are displayed) Labs Reviewed  COMPREHENSIVE METABOLIC PANEL - Abnormal; Notable for the following components:      Result Value   AST 79 (*)    All other components within normal limits  RESP PANEL BY RT-PCR (FLU A&B, COVID) ARPGX2  CBC WITH DIFFERENTIAL/PLATELET  MAGNESIUM  TROPONIN I (HIGH SENSITIVITY)  TROPONIN I (HIGH SENSITIVITY)    EKG EKG Interpretation  Date/Time:  Thursday May 24 2021 16:53:29 EST Ventricular Rate:  57 PR Interval:  124 QRS Duration: 86 QT Interval:  450 QTC Calculation: 438 R Axis:   3 Text Interpretation: Sinus bradycardia Otherwise normal ECG No significant change since last tracing EARLIER SAME DATE Confirmed by Susy Frizzle [id number]) on 05/24/2021 5:23:32 PM  Radiology DG Chest 2 View  Result Date:  05/24/2021 CLINICAL DATA:  Chest pain, body aches and chills x3 days. EXAM: CHEST - 2 VIEW COMPARISON:  Sep 29, 2020 FINDINGS: The heart size and mediastinal contours are within normal limits. Both lungs are clear. Radiopaque surgical clips are seen within the abdomen on the lateral view. The visualized skeletal structures are unremarkable. IMPRESSION: No active cardiopulmonary disease. Electronically Signed   By: Aram Candela M.D.   On: 05/24/2021 23:12    Procedures Procedures    Medications Ordered in ED Medications - No data to display  ED Course/ Medical Decision Making/ A&P                           Medical Decision Making  Presents to the ED with several intermittent chest pain with radiation to her whole chest ranging from a 3-6, describing it as pressure worse with activity but improves with rest.  No similar history in the past.  Also reports some overall fatigue along with a dry cough.  Does have a prior history of family CAD.  Prior history of TIA for patient.  Has not taken anything for improvement in symptoms.  Does have significant risk factors such as diabetes, hypertension, hyperlipidemia.  On arrival patient's vitals are within normal limits, there is no hypoxia or tachycardia.  Blood pressure is within normal limits.  Her lungs are clear to auscultation, chest is not reproducible with palpation.  Abdomen is soft nontender.  No bilateral calf tenderness, no pitting edema.  Respiratory panel obtained in triage negative for influenza A, influenza B, COVID-19.  Labs, interpreted by me.  CBC with no leukocytosis, hemoglobin is stable.  CMP without any electrolyte derangement.  AST slightly elevated, although she reports that this has been an ongoing problem.  Denies any history of alcohol abuse.  Creatinine levels within normal limits.  Magnesium is also normal.  First troponin is negative.  Second troponin is also negative.  Heart score calculated at 5, shared decision making  with patient about admission versus outpatient treatment at this time.  She is agreeable with outpatient treatment at this time.  She did feel like this is more so upper respiratory in nature.  Given a short course of steroids to help with her symptoms.  Her x-ray is without any pneumonia.  Patient hemodynamically stable for discharge  Portions of this note were generated with Dragon dictation software. Dictation errors may occur despite best attempts at proofreading.  Final Clinical Impression(s) / ED Diagnoses Final diagnoses:  Atypical chest pain    Rx / DC Orders ED Discharge Orders          Ordered    predniSONE (DELTASONE) 20 MG tablet  Daily        05/24/21 2257              Claude Manges, PA-C 05/24/21 2336    Pollyann Savoy, MD 05/29/21 [phone]

## 2021-05-24 NOTE — Discharge Instructions (Addendum)
I have prescribed a short course of steroids to help with your symptoms.  Please take 2 tablets daily for the next 5 days.  I also provided a referral for cardiology, you will need to schedule an appointment in order to obtain further evaluation with a stress test and echo cardiogram.

## 2021-06-19 ENCOUNTER — Ambulatory Visit: Payer: Medicare Other | Admitting: Neurology

## 2021-06-19 DIAGNOSIS — Z6836 Body mass index (BMI) 36.0-36.9, adult: Secondary | ICD-10-CM

## 2021-06-19 DIAGNOSIS — M542 Cervicalgia: Secondary | ICD-10-CM

## 2021-06-19 DIAGNOSIS — M797 Fibromyalgia: Secondary | ICD-10-CM

## 2021-06-19 DIAGNOSIS — G43709 Chronic migraine without aura, not intractable, without status migrainosus: Secondary | ICD-10-CM | POA: Diagnosis not present

## 2021-06-19 DIAGNOSIS — G4700 Insomnia, unspecified: Secondary | ICD-10-CM | POA: Diagnosis not present

## 2021-06-19 NOTE — Progress Notes (Signed)
Botox- 200 units x 1 vial Lot: [id number] Expiration: 01/2024 NDC: 6384-6659-93  Bacteriostatic 0.9% Sodium Chloride- 64mL total Lot: [id number] Expiration: 12/19/2022 NDC: 9390-3009-23  Dx: R00.762 B/B

## 2021-06-19 NOTE — Progress Notes (Signed)
[facility]  PATIENT: Connie Arnold DOB: Jun 25, 1965    HISTORICAL  CHIEF COMPLAINT:  Chief Complaint  Patient presents with   Procedure    RM 1, alone. Here for Botox.     HISTORY OF PRESENT ILLNESS:  Connie Arnold is a 56 y.o. woman with chronic migraine headaches and other neurologic issues.    Update 06/19/2021: She continues to get a benefit from Botox.  Headaches have been daily before the Botox.  She now gets only a couple of the week especially during the first 2 and half months and sometimes has more headaches in the last couple weeks of each cycle.  She has tolerated the injections well.  Specifically, no eyelid drooping or neck weakness.  She is also on topiramate to help with her chronic migraines.  HA's are pounding and associated with photophobia, phonophobia and nausea.    She also has fibromyalgia and her pain worsens if she is less active..   Combination of oxycodone, duloxetine has helped the FMS pain.     She has not escalated and is not getting opiates from other doctors. PDMP Database has been reviewed.  She is also on Cymbalta for the FMS.  She is back to  exercising at the gym .    She has sleep maintenance greater and sleep onset insomnia.   She is on trazodone 100 mg po qHS.  Mood is stable.  Due to chest pain, she stopped phentermine and is discussiing DM meds for weight loss with Dr. Cyndia Diver.    ------------ Chronic Migraine History:   She has had chronic migraine headache with moderate pain, flaring up to severe,  for most of the past couple of years   She is having chronic migraine 30/30 days every month, lasting for more than 4 hours every day.  She has received some benefit from occipital nerve blocks, trigger point injections and medications but the headaches still persist on a daily basis for past couple years.    She had been on Botox for migraine prophylaxis in the past with benefit. However, with changes in insurance she was  unable to continue due to very high co-pay.   She is currently on Topamax 200 mg by mouth twice a day and muscle relaxants and opiates.  She has occasional emergency room visits and frequent additional visits to my office for trigger point injections..  She has tried and failed multiple prophylactic medications for her chronic migraine: Antiepileptics:    zonisamide, topiramate, Keppra.   (Due to obesity, Depakote is a poor choice) Anti-depressants  amitriptyline, imipramine Anti-hypertensives:  Diamox Muscle relaxants:  baclofen, tizanidine NSAID's:  She is unable to tolerate chronic anti-inflammatories due to mild renal insufficiency.  She has had multiple visits to ER and to our office for injections when pain is more severe.     SLE:   She see Dr. Nydia Bouton at [facility].   She was diagnosed with SLE in 2006 and has been on Plaquenil x years and more recently Benlysta.    Her Complements were normal 11/17 but ANA is positive (speckled)   ALLERGIES: Allergies  Allergen Reactions   Nsaids Other (See Comments)    Chronic kidney failure   Codeine    Ramipril    Imitrex [Sumatriptan] Nausea And Vomiting    HOME MEDICATIONS:  Current Outpatient Medications:    ADVAIR DISKUS 250-50 MCG/DOSE AEPB, Inhale 1 puff into the lungs as needed. , Disp: , Rfl:    albuterol (PROVENTIL  HFA;VENTOLIN HFA) 108 (90 Base) MCG/ACT inhaler, Inhale into the lungs., Disp: , Rfl:    albuterol (PROVENTIL) (2.5 MG/3ML) 0.083% nebulizer solution, , Disp: , Rfl:    Azelastine-Fluticasone 137-50 MCG/ACT SUSP, Place 1 spray into the nose as needed. , Disp: , Rfl:    baclofen (LIORESAL) 10 MG tablet, TAKE 1 TO 2 TABLETS BY  MOUTH 3 TIMES DAILY, Disp: 540 tablet, Rfl: 3   Belimumab 200 MG/ML SOAJ, Inject 1 Dose into the skin once a week. , Disp: , Rfl:    bupivacaine (MARCAINE PRESERVATIVE FREE) 0.5 % SOLN injection, , Disp: , Rfl:    cetirizine (ZYRTEC) 10 MG tablet, Take 10 mg by mouth daily. , Disp: , Rfl:    DULoxetine  (CYMBALTA) 60 MG capsule, Take 1 capsule (60 mg total) by mouth daily., Disp: 90 capsule, Rfl: 4   fluconazole (DIFLUCAN) 150 MG tablet, Take 150 mg by mouth once. , Disp: , Rfl:    FLUoxetine (PROZAC) 40 MG capsule, Take 40 mg by mouth daily. , Disp: , Rfl:    furosemide (LASIX) 40 MG tablet, Take 40 mg by mouth daily. , Disp: , Rfl:    glucose blood test strip, , Disp: , Rfl:    heparin 17408 UNIT/ML injection, Instill 4 cc into bladder 2x daily, Disp: , Rfl:    hydroxychloroquine (PLAQUENIL) 200 MG tablet, Take 1 tablet (200 mg total) by mouth 2 (two) times daily., Disp: 30 tablet, Rfl: 0   ipratropium-albuterol (DUONEB) 0.5-2.5 (3) MG/3ML SOLN, 3 mLs as needed. , Disp: , Rfl:    lactulose, encephalopathy, (CHRONULAC) 10 GM/15ML SOLN, daily as needed. , Disp: , Rfl:    montelukast (SINGULAIR) 10 MG tablet, Take 10 mg by mouth at bedtime. , Disp: , Rfl:    oxyCODONE (ROXICODONE) 15 MG immediate release tablet, TAKE ONE TABLET BY MOUTH EVERY EIGHT HOURS AS NEEDED FOR PAIN, Disp: 90 tablet, Rfl: 0   phentermine 37.5 MG capsule, Take 1 capsule by mouth once daily in the morning, Disp: 30 capsule, Rfl: 5   promethazine (PHENERGAN) 50 MG/ML injection, Inject into the muscle as needed. , Disp: , Rfl:    Rimegepant Sulfate (NURTEC) 75 MG TBDP, Take 75 mg by mouth daily as needed (take for abortive therapy of migraine, no more than 1 tablet in 24 hours or 10 per month)., Disp: 8 tablet, Rfl: 11   Rimegepant Sulfate (NURTEC) 75 MG TBDP, Take 75 mg by mouth daily as needed (take for abortive therapy of migraine, no more than 1 tablet in 24 hours or 10 per month)., Disp: 6 tablet, Rfl: 0   topiramate (TOPAMAX) 200 MG tablet, TAKE 1 TABLET BY MOUTH  TWICE DAILY, Disp: 180 tablet, Rfl: 3   traZODone (DESYREL) 100 MG tablet, Take 2 tablets (200 mg total) by mouth at bedtime., Disp: 180 tablet, Rfl: 1   triamcinolone (NASACORT) 55 MCG/ACT AERO nasal inhaler, Place 2 sprays into the nose., Disp: , Rfl:     trimethoprim (TRIMPEX) 100 MG tablet, Take 1 tablet by mouth  daily, Disp: , Rfl:    Vitamin D, Ergocalciferol, (DRISDOL) 50000 UNITS CAPS capsule, Take 50,000 Units by mouth every 7 (seven) days. , Disp: , Rfl:   PAST MEDICAL HISTORY: Past Medical History:  Diagnosis Date   Asthma    Carpal tunnel syndrome 09/29/2014   Chronic back pain    Chronic kidney disease    Diabetes mellitus without complication (HCC)    Fatty infiltration of liver 03/17/2015  Fibromyalgia    Hypertension    IBS (irritable bowel syndrome)    Interstitial cystitis    Lupus (HCC)    Lupus (systemic lupus erythematosus) (HCC)    Migraines    Pseudotumor cerebri     PAST SURGICAL HISTORY: Past Surgical History:  Procedure Laterality Date   ABDOMINAL HYSTERECTOMY     APPENDECTOMY     CHOLECYSTECTOMY     CYSTOSCOPY      FAMILY HISTORY: Family History  Problem Relation Age of Onset   Graves' disease Mother    Heart disease Father    Prostate cancer Father    Healthy Brother    Healthy Maternal Aunt    Healthy Maternal Uncle     SOCIAL HISTORY:  Social History   Socioeconomic History   Marital status: Married    Spouse name: Not on file   Number of children: Not on file   Years of education: Not on file   Highest education level: Not on file  Occupational History   Not on file  Tobacco Use   Smoking status: Never   Smokeless tobacco: Never  Vaping Use   Vaping Use: Never used  Substance and Sexual Activity   Alcohol use: No   Drug use: No   Sexual activity: Not on file  Other Topics Concern   Not on file  Social History Narrative   Not on file   Social Determinants of Health   Financial Resource Strain: Not on file  Food Insecurity: Not on file  Transportation Needs: Not on file  Physical Activity: Not on file  Stress: Not on file  Social Connections: Not on file  Intimate Partner Violence: Not on file     PHYSICAL EXAM  Weight 220 pounds Height 5'5" BMI =  36.6  General: The patient is an obese woman in no distress.     Neck: She has mild tenderness over the splenius capitis muscles, right greater than left.  Neurologic Exam  Mental status: The patient is alert and oriented x 3 at the time of the examination. The patient has apparent normal recent and remote memory, with an apparently normal attention span and concentration ability.   Speech is normal.  Cranial nerves: Extraocular movements are full.     Facial strength is normal.  Trapezius and sternocleidomastoid strength is normal.  Motor:  Muscle bulk is normal.   Tone is normal. Strength is  5 / 5 in limbs   Gait and station: Station is normal.   Gait is normal.  Tandem gait is normal     DIAGNOSTIC DATA (LABS, IMAGING, TESTING) - I reviewed patient records, labs, notes, testing and imaging myself where available.  Lab Results  Component Value Date   WBC 7.5 05/24/2021   HGB 14.0 05/24/2021   HCT 42.9 05/24/2021   MCV 89.9 05/24/2021   PLT 214 05/24/2021        ASSESSMENT AND PLAN    1. Chronic migraine without aura without status migrainosus, not intractable   2. Neck pain   3. Fibromyalgia   4. Insomnia, unspecified type   5. BMI 36.0-36.9,adult       1.   Inject Botox 200 units as follows:: 115 units into the muscles of the face and head as follows: Frontalis (5 units x 2 on the left, 5 units x 2 on the right), upper frontalis (5 units x 2 on the left, 5 units x 2 on the right) procerus and corrugators (5 units x  3), temporalis (5 units x 4 on the left and 5 units x 4 on the right), occipitalis (5 units x 2 on the left and 5 units x 2 on the right) 80 Units into the muscles of the neck as follows: Splenius capitis (12.5 units x 2), Splenius  cervicus (7.5 units x 2), trapezius (10 units x 2); rhomboid 10 U x 2   5 U wasted 2.  Continue oxycodone and Cymbalta for fibromyalgia pain.  I will renew the prescription and postdated to 03/22/2021.Marland Kitchen  The PDMP was  reviewed.  She has not been getting medications from other practices.  3.   Stay active and exercise as tolerated. 4.   Take trazodone 100 mg with 5 mg melatonin 90 minutes before bedtime and again 30 minutes before bedtime.   5.    She will discuss medical weight loss optons with PCP 6.   return in 3 months or sooner if there are new or worsening neurologic symptoms.  Dorien Bessent A. Epimenio Foot, MD, PhD 06/19/2021, 10:51 AM Certified in Neurology, Clinical Neurophysiology, Sleep Medicine, Pain Medicine and Neuroimaging  [facility] [address], [address] [place], [place] [address] [phone]

## 2021-06-20 ENCOUNTER — Other Ambulatory Visit: Payer: Self-pay | Admitting: Neurology

## 2021-06-21 NOTE — Telephone Encounter (Signed)
Received refill request for oxycodone.  Last OV was on 09/13/20.  Next OV is scheduled for 09/13/21 .  Last RX was written on 05/22/21 for 90 tabs.   [place] [facility] Database has been reviewed.

## 2021-07-20 ENCOUNTER — Other Ambulatory Visit: Payer: Self-pay | Admitting: Neurology

## 2021-07-23 ENCOUNTER — Telehealth: Payer: Self-pay | Admitting: Family Medicine

## 2021-07-23 NOTE — Telephone Encounter (Signed)
Pt called in to check in on the status of the refill. She was told by [facility] that it was not due because last script was for 90 day supply. I advised the pt that I am unaware of why they are advising her that. It was filled on [place] drug registry for 90 tab/ 30 day supply. I have corrected this on the script to ensure they know that pt is due and it is a 30 day supply.  ?

## 2021-07-23 NOTE — Telephone Encounter (Signed)
I called the patient because in looking at the drug registry the pt has been getting this filled for 90 tablet equalling a 30 day supply. I advised looks like the refill is pending for Dr Felecia Shelling to approve but shouldn't be an issue. Pt verbalized understanding. ? ?

## 2021-07-23 NOTE — Telephone Encounter (Signed)
Pt is asking for a call to discuss how the pharmacy :  [facility]  is telling her they received her refill request for as a 90 day for her oxyCODONE (ROXICODONE) 15 MG immediate release tablet, pt has always only received this  as a 30 day.  Pt states she is now having difficulty in getting her oxyCODONE (ROXICODONE) 15 MG immediate release tablet, please call. ?

## 2021-07-23 NOTE — Telephone Encounter (Signed)
Last OV was on 04/10/21.  ?Next OV is scheduled for 09/13/21 .  ?Last RX was written on 06/21/21 for 90 tabs.  ? ?[place] [facility] Database has been reviewed.  ?

## 2021-08-20 ENCOUNTER — Other Ambulatory Visit: Payer: Self-pay | Admitting: Neurology

## 2021-08-20 NOTE — Telephone Encounter (Signed)
Last OV was on 04/10/21.  ?Next OV is scheduled for 09/13/21.  ?Last RX was written on 07/23/21 for 90 tabs.  ? ?[place] [facility] Database has been reviewed.  ?

## 2021-09-13 ENCOUNTER — Ambulatory Visit: Payer: Medicare Other | Admitting: Neurology

## 2021-09-13 ENCOUNTER — Encounter: Payer: Self-pay | Admitting: Neurology

## 2021-09-13 DIAGNOSIS — M542 Cervicalgia: Secondary | ICD-10-CM | POA: Diagnosis not present

## 2021-09-13 DIAGNOSIS — M797 Fibromyalgia: Secondary | ICD-10-CM

## 2021-09-13 DIAGNOSIS — G43709 Chronic migraine without aura, not intractable, without status migrainosus: Secondary | ICD-10-CM | POA: Diagnosis not present

## 2021-09-13 MED ORDER — OXYCODONE HCL 15 MG PO TABS: 15.0000 mg | ORAL_TABLET | Freq: Three times a day (TID) | ORAL | 0 refills | Status: DC | PRN

## 2021-09-13 NOTE — Progress Notes (Signed)
Botox- 200 units x 1 vial ?Lot: [id number] ?Expiration: 04/2024 ?NDC: 0023-3921-02 ? ?Bacteriostatic 0.9% Sodium Chloride- 4mL total ?Lot: [id number] ?Expiration: 12/19/2022 ?NDC: 0409-1966-02 ? ?Dx: G43.709 ?B/B  ?

## 2021-09-13 NOTE — Progress Notes (Signed)
? ?[facility] ? ?PATIENT: Connie Arnold ?DOB: Sep 12, 1965 ? ? ? ?HISTORICAL ? ?CHIEF COMPLAINT:  ?Chief Complaint  ?Patient presents with  ? Procedure  ?  RM 16, alone. Here for Botox.   ? ? ?HISTORY OF PRESENT ILLNESS:  ?Connie Arnold is a 56 y.o. woman with chronic migraine headaches and other neurologic issues.   ? ?Update 09/13/2021: ?She continues to get a benefit from Botox but the last series did not seem to help her as much as most of her previous series.  Specifically she has had more headaches on the right.  The benefit was better the first couple months but headaches have been more daily the last couple weeks.  Before Botox she had daily headaches.   She has tolerated the injections well.  Specifically, no eyelid drooping or neck weakness.  She is also on topiramate to help with her chronic migraines.  HA's are pounding and associated with photophobia, phonophobia and nausea.   ? ?She also has fibromyalgia and her pain worsens if she is less active..   She is on a combination of oxycodone, duloxetine that has helped the fibromyalgia pain..     She has not escalated and is not getting opiates from other doctors. PDMP Database has been reviewed.  She is also on Cymbalta for the FMS.  She is back to  exercising at the gym .   ? ?She has sleep maintenance greater and sleep onset insomnia.   She is on trazodone 100 mg po qHS.  Mood is stable. ? ?------------ ?Chronic Migraine History:   She has had chronic migraine headache with moderate pain, flaring up to severe,  for most of the past couple of years   She is having chronic migraine 30/30 days every month, lasting for more than 4 hours every day.  She has received some benefit from occipital nerve blocks, trigger point injections and medications but the headaches still persist on a daily basis for past couple years.   ? ?She had been on Botox for migraine prophylaxis in the past with benefit. However, with changes in insurance she was  unable to continue due to very high co-pay.   She is currently on Topamax 200 mg by mouth twice a day and muscle relaxants and opiates.  She has occasional emergency room visits and frequent additional visits to my office for trigger point injections.. ? ?She has tried and failed multiple prophylactic medications for her chronic migraine: ?Antiepileptics:    zonisamide, topiramate, Keppra.   (Due to obesity, Depakote is a poor choice) ?Anti-depressants  amitriptyline, imipramine ?Anti-hypertensives:  Diamox ?Muscle relaxants:  baclofen, tizanidine ?NSAID's:  She is unable to tolerate chronic anti-inflammatories due to mild renal insufficiency. ? ?She has had multiple visits to ER and to our office for injections when pain is more severe.    ? ?SLE:   She see Dr. Nydia Bouton at [facility].   She was diagnosed with SLE in 2006 and has been on Plaquenil x years and more recently Benlysta.    Her Complements were normal 11/17 but ANA is positive (speckled)  ? ?ALLERGIES: ?Allergies  ?Allergen Reactions  ? Nsaids Other (See Comments)  ?  Chronic kidney failure  ? Codeine   ? Ramipril   ? Imitrex [Sumatriptan] Nausea And Vomiting  ? ? ?HOME MEDICATIONS: ? ?Current Outpatient Medications:  ?  ADVAIR DISKUS 250-50 MCG/DOSE AEPB, Inhale 1 puff into the lungs as needed. , Disp: , Rfl:  ?  albuterol (PROVENTIL  HFA;VENTOLIN HFA) 108 (90 Base) MCG/ACT inhaler, Inhale into the lungs., Disp: , Rfl:  ?  albuterol (PROVENTIL) (2.5 MG/3ML) 0.083% nebulizer solution, , Disp: , Rfl:  ?  Azelastine-Fluticasone 137-50 MCG/ACT SUSP, Place 1 spray into the nose as needed. , Disp: , Rfl:  ?  baclofen (LIORESAL) 10 MG tablet, TAKE 1 TO 2 TABLETS BY  MOUTH 3 TIMES DAILY, Disp: 540 tablet, Rfl: 3 ?  Belimumab 200 MG/ML SOAJ, Inject 1 Dose into the skin once a week. , Disp: , Rfl:  ?  bupivacaine (MARCAINE PRESERVATIVE FREE) 0.5 % SOLN injection, , Disp: , Rfl:  ?  cetirizine (ZYRTEC) 10 MG tablet, Take 10 mg by mouth daily. , Disp: , Rfl:  ?  DULoxetine  (CYMBALTA) 60 MG capsule, Take 1 capsule (60 mg total) by mouth daily., Disp: 90 capsule, Rfl: 4 ?  fluconazole (DIFLUCAN) 150 MG tablet, Take 150 mg by mouth once. , Disp: , Rfl:  ?  FLUoxetine (PROZAC) 40 MG capsule, Take 40 mg by mouth daily. , Disp: , Rfl:  ?  furosemide (LASIX) 40 MG tablet, Take 40 mg by mouth daily. , Disp: , Rfl:  ?  glucose blood test strip, , Disp: , Rfl:  ?  heparin 61607 UNIT/ML injection, Instill 4 cc into bladder 2x daily, Disp: , Rfl:  ?  hydroxychloroquine (PLAQUENIL) 200 MG tablet, Take 1 tablet (200 mg total) by mouth 2 (two) times daily., Disp: 30 tablet, Rfl: 0 ?  ipratropium-albuterol (DUONEB) 0.5-2.5 (3) MG/3ML SOLN, 3 mLs as needed. , Disp: , Rfl:  ?  lactulose, encephalopathy, (CHRONULAC) 10 GM/15ML SOLN, daily as needed. , Disp: , Rfl:  ?  montelukast (SINGULAIR) 10 MG tablet, Take 10 mg by mouth at bedtime. , Disp: , Rfl:  ?  oxyCODONE (ROXICODONE) 15 MG immediate release tablet, Take 1 tablet (15 mg total) by mouth every 8 (eight) hours as needed. for pain, Disp: 90 tablet, Rfl: 0 ?  phentermine 37.5 MG capsule, Take 1 capsule by mouth once daily in the morning, Disp: 30 capsule, Rfl: 5 ?  promethazine (PHENERGAN) 50 MG/ML injection, Inject into the muscle as needed. , Disp: , Rfl:  ?  Rimegepant Sulfate (NURTEC) 75 MG TBDP, Take 75 mg by mouth daily as needed (take for abortive therapy of migraine, no more than 1 tablet in 24 hours or 10 per month)., Disp: 8 tablet, Rfl: 11 ?  Rimegepant Sulfate (NURTEC) 75 MG TBDP, Take 75 mg by mouth daily as needed (take for abortive therapy of migraine, no more than 1 tablet in 24 hours or 10 per month)., Disp: 6 tablet, Rfl: 0 ?  topiramate (TOPAMAX) 200 MG tablet, TAKE 1 TABLET BY MOUTH  TWICE DAILY, Disp: 180 tablet, Rfl: 3 ?  traZODone (DESYREL) 100 MG tablet, Take 2 tablets (200 mg total) by mouth at bedtime., Disp: 180 tablet, Rfl: 1 ?  triamcinolone (NASACORT) 55 MCG/ACT AERO nasal inhaler, Place 2 sprays into the nose., Disp:  , Rfl:  ?  trimethoprim (TRIMPEX) 100 MG tablet, Take 1 tablet by mouth  daily, Disp: , Rfl:  ?  Vitamin D, Ergocalciferol, (DRISDOL) 50000 UNITS CAPS capsule, Take 50,000 Units by mouth every 7 (seven) days. , Disp: , Rfl:  ? ?PAST MEDICAL HISTORY: ?Past Medical History:  ?Diagnosis Date  ? Asthma   ? Carpal tunnel syndrome 09/29/2014  ? Chronic back pain   ? Chronic kidney disease   ? Diabetes mellitus without complication (HCC)   ? 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 Fibromyalgia   ? Hypertension   ? IBS (irritable bowel syndrome)   ? Interstitial cystitis   ? Lupus (HCC)   ? Lupus (systemic lupus erythematosus) (HCC)   ? Migraines   ? Pseudotumor cerebri   ? ? ?PAST SURGICAL HISTORY: ?Past Surgical History:  ?Procedure Laterality Date  ? ABDOMINAL HYSTERECTOMY    ? APPENDECTOMY    ? CHOLECYSTECTOMY    ? CYSTOSCOPY    ? ? ?FAMILY HISTORY: ?Family History  ?Problem Relation Age of Onset  ? Graves' disease Mother   ? Heart disease Father   ? Prostate cancer Father   ? Healthy Brother   ? Healthy Maternal Aunt   ? Healthy Maternal Uncle   ? ? ?SOCIAL HISTORY: ? ?Social History  ? ?Socioeconomic History  ? Marital status: Married  ?  Spouse name: Not on file  ? Number of children: Not on file  ? Years of education: Not on file  ? Highest education level: Not on file  ?Occupational History  ? Not on file  ?Tobacco Use  ? Smoking status: Never  ? Smokeless tobacco: Never  ?Vaping Use  ? Vaping Use: Never used  ?Substance and Sexual Activity  ? Alcohol use: No  ? Drug use: No  ? Sexual activity: Not on file  ?Other Topics Concern  ? Not on file  ?Social History Narrative  ? Not on file  ? ?Social Determinants of Health  ? ?Financial Resource Strain: Not on file  ?Food Insecurity: Not on file  ?Transportation Needs: Not on file  ?Physical Activity: Not on file  ?Stress: Not on file  ?Social Connections: Not on file  ?Intimate Partner Violence: Not on file  ? ? ? ?PHYSICAL EXAM ? ? ?General: The patient is an obese  woman in no distress.    ? ?Neck: She has mild tenderness over the splenius capitis muscles, right greater than left. ? ?Neurologic Exam ? ?Mental status: The patient is alert and oriented x 3 at the time o

## 2021-10-16 ENCOUNTER — Ambulatory Visit: Payer: Medicare Other | Admitting: Family Medicine

## 2021-10-16 ENCOUNTER — Encounter: Payer: Self-pay | Admitting: Family Medicine

## 2021-10-16 ENCOUNTER — Telehealth: Payer: Self-pay | Admitting: *Deleted

## 2021-10-16 VITALS — BP 134/92 | HR 77 | Ht 65.0 in | Wt 222.5 lb

## 2021-10-16 DIAGNOSIS — M797 Fibromyalgia: Secondary | ICD-10-CM

## 2021-10-16 DIAGNOSIS — G4700 Insomnia, unspecified: Secondary | ICD-10-CM

## 2021-10-16 DIAGNOSIS — R5383 Other fatigue: Secondary | ICD-10-CM | POA: Diagnosis not present

## 2021-10-16 DIAGNOSIS — G43709 Chronic migraine without aura, not intractable, without status migrainosus: Secondary | ICD-10-CM | POA: Diagnosis not present

## 2021-10-16 MED ORDER — UBRELVY 100 MG PO TABS: 100.0000 mg | ORAL_TABLET | Freq: Every day | ORAL | 11 refills | Status: AC | PRN

## 2021-10-16 NOTE — Patient Instructions (Signed)
Below is our plan:  We will continue current treatment plan. I will try to get Bernita Raisin covered for abortive therapy. Please take 1 tablet at onset of headache. May take 1 additional tablet in 2 hours if needed. Do not take more than 2 tablets in 24 hours or more than 8 in a month.   Please make sure you are staying well hydrated. I recommend 50-60 ounces daily. Well balanced diet and regular exercise encouraged. Consistent sleep schedule with 6-8 hours recommended.   Please continue follow up with care team as directed.   Follow up with Dr Epimenio Foot in July as scheduled   You may receive a survey regarding today's visit. I encourage you to leave honest feed back as I do use this information to improve patient care. Thank you for seeing me today!

## 2021-10-16 NOTE — Telephone Encounter (Signed)
Submitted PA Ubrelvy on [facility]. ZOX:[id number]. PA Case ID: [id number] - Rx #: [id number]. Waiting on determination from [employer].

## 2021-10-16 NOTE — Progress Notes (Signed)
Chief Complaint  Patient presents with   Follow-up    Rm 10, alone. Here for 6 month migraine f/u. No changes in sx since last OV.     HISTORY OF PRESENT ILLNESS:  10/16/21 ALL:  Connie Arnold returns for follow up for migraines. She continues topiramate 200mg  twice daily.  She continues Botox with Dr Felecia Shelling and also on oxycodone 15mg  TID (last filled 09/19/2021), trazodone 200mg  QHS, baclofen 10-20mg  TID and duloxetine 60mg  daily for fibromyalgia. Also taking phentermine 37.5mg  daily for fatigue. She feels that symptoms wax and wane. She feels that migraines are fairly well managed. She has about 20 headache days a week. 3-4 are described as severe. She could not afford Nurtec. Allergic to triptan. She feels that Nurtec helped for a few hours but headache returned. She takes oxycodone 3-4 times daily. She does feel that last round of Botox was more helpful than previous procedure. She denies vision loss. She has updated eye exams every 6 months.   04/10/2021 ALL:  Connie Arnold is a 56 y.o. female here today for follow up for acute visit for prolonged migraines. She has a history of migrainous headaches with intermittent complex symptoms of vision loss, numbness and tingling. She reports having a migraine that started about 5 days. She called yesterday to report continued headaches following medrol dose pack prescribed by PCP 1 week ago for low back pain. She described tingling/pulling sensation globally. She has nausea and light sensitivity. She has been taking oxycodone and Tylenol with minimal relief. She has had more neck and shoulder pain as well. She has tried heat and rest with minimal relief. Last Botox 03/20/2021. Insurance would not approve CGRP. She also continues topiramate 200mg  BID. She is on duloxetine 60mg  daily for fibromyalgia. Trazodone 200mg  daily for insomnia. Phentermine for weight loss. She has taken this on and off for several years. She is uncertain of any specific changes that  could contribute. She reports allergy to triptans. She broke out in hives with "an injection" of triptans.    REVIEW OF SYSTEMS: Out of a complete 14 system review of symptoms, the patient complains only of the following symptoms, headaches, chronic pain, fatigue, and all other reviewed systems are negative.   ALLERGIES: Allergies  Allergen Reactions   Nsaids Other (See Comments)    Chronic kidney failure   Codeine    Ramipril    Imitrex [Sumatriptan] Nausea And Vomiting     HOME MEDICATIONS: Outpatient Medications Prior to Visit  Medication Sig Dispense Refill   ADVAIR DISKUS 250-50 MCG/DOSE AEPB Inhale 1 puff into the lungs as needed.      albuterol (PROVENTIL HFA;VENTOLIN HFA) 108 (90 Base) MCG/ACT inhaler Inhale into the lungs.     albuterol (PROVENTIL) (2.5 MG/3ML) 0.083% nebulizer solution      Azelastine-Fluticasone 137-50 MCG/ACT SUSP Place 1 spray into the nose as needed.      baclofen (LIORESAL) 10 MG tablet TAKE 1 TO 2 TABLETS BY  MOUTH 3 TIMES DAILY 540 tablet 3   Belimumab 200 MG/ML SOAJ Inject 1 Dose into the skin once a week.      cetirizine (ZYRTEC) 10 MG tablet Take 10 mg by mouth daily.      DULoxetine (CYMBALTA) 60 MG capsule Take 1 capsule (60 mg total) by mouth daily. 90 capsule 4   fluconazole (DIFLUCAN) 150 MG tablet Take 150 mg by mouth once.      FLUoxetine (PROZAC) 40 MG capsule Take 40 mg by mouth daily.  furosemide (LASIX) 40 MG tablet Take 40 mg by mouth daily.      glucose blood test strip      hydroxychloroquine (PLAQUENIL) 200 MG tablet Take 1 tablet (200 mg total) by mouth 2 (two) times daily. 30 tablet 0   ipratropium-albuterol (DUONEB) 0.5-2.5 (3) MG/3ML SOLN 3 mLs as needed.      lactulose, encephalopathy, (CHRONULAC) 10 GM/15ML SOLN daily as needed.      montelukast (SINGULAIR) 10 MG tablet Take 10 mg by mouth at bedtime.      oxyCODONE (ROXICODONE) 15 MG immediate release tablet Take 1 tablet (15 mg total) by mouth every 8 (eight) hours as  needed. for pain 90 tablet 0   phentermine 37.5 MG capsule Take 1 capsule by mouth once daily in the morning 30 capsule 5   promethazine (PHENERGAN) 50 MG/ML injection Inject into the muscle as needed.      topiramate (TOPAMAX) 200 MG tablet TAKE 1 TABLET BY MOUTH  TWICE DAILY 180 tablet 3   traZODone (DESYREL) 100 MG tablet Take 2 tablets (200 mg total) by mouth at bedtime. 180 tablet 1   triamcinolone (NASACORT) 55 MCG/ACT AERO nasal inhaler Place 2 sprays into the nose.     trimethoprim (TRIMPEX) 100 MG tablet Take 1 tablet by mouth  daily     Vitamin D, Ergocalciferol, (DRISDOL) 50000 UNITS CAPS capsule Take 50,000 Units by mouth every 7 (seven) days.      Rimegepant Sulfate (NURTEC) 75 MG TBDP Take 75 mg by mouth daily as needed (take for abortive therapy of migraine, no more than 1 tablet in 24 hours or 10 per month). 8 tablet 11   Rimegepant Sulfate (NURTEC) 75 MG TBDP Take 75 mg by mouth daily as needed (take for abortive therapy of migraine, no more than 1 tablet in 24 hours or 10 per month). 6 tablet 0   No facility-administered medications prior to visit.     PAST MEDICAL HISTORY: Past Medical History:  Diagnosis Date   Asthma    Carpal tunnel syndrome 09/29/2014   Chronic back pain    Chronic kidney disease    Diabetes mellitus without complication (HCC)    Fatty infiltration of liver 03/17/2015   Fibromyalgia    Hypertension    IBS (irritable bowel syndrome)    Interstitial cystitis    Lupus (HCC)    Lupus (systemic lupus erythematosus) (HCC)    Migraines    Pseudotumor cerebri      PAST SURGICAL HISTORY: Past Surgical History:  Procedure Laterality Date   ABDOMINAL HYSTERECTOMY     APPENDECTOMY     CHOLECYSTECTOMY     CYSTOSCOPY       FAMILY HISTORY: Family History  Problem Relation Age of Onset   Graves' disease Mother    Heart disease Father    Prostate cancer Father    Healthy Brother    Healthy Maternal Aunt    Healthy Maternal Uncle       SOCIAL HISTORY: Social History   Socioeconomic History   Marital status: Married    Spouse name: Not on file   Number of children: Not on file   Years of education: Not on file   Highest education level: Not on file  Occupational History   Not on file  Tobacco Use   Smoking status: Never   Smokeless tobacco: Never  Vaping Use   Vaping Use: Never used  Substance and Sexual Activity   Alcohol use: No   Drug use:  No   Sexual activity: Not on file  Other Topics Concern   Not on file  Social History Narrative   Not on file   Social Determinants of Health   Financial Resource Strain: Not on file  Food Insecurity: Not on file  Transportation Needs: Not on file  Physical Activity: Not on file  Stress: Not on file  Social Connections: Not on file  Intimate Partner Violence: Not on file     PHYSICAL EXAM  Vitals:   10/16/21 1006  BP: (!) 134/92  Pulse: 77  Weight: 222 lb 8 oz (100.9 kg)  Height: 5\' 5"  (1.651 m)    Body mass index is 37.03 kg/m.  Generalized: Well developed, in no acute distress  Cardiology: normal rate and rhythm, no murmur auscultated  Respiratory: clear to auscultation bilaterally    Neurological examination  Mentation: Alert oriented to time, place, history taking. Follows all commands speech and language fluent Cranial nerve II-XII: Pupils were equal round reactive to light. Extraocular movements were full, visual field were full on confrontational test. Facial sensation and strength were normal. Head turning and shoulder shrug  were normal and symmetric. Motor: The motor testing reveals 5 over 5 strength of all 4 extremities. Good symmetric motor tone is noted throughout.  Sensory: Sensory testing is intact to soft touch on all 4 extremities. No evidence of extinction is noted.  Coordination: Cerebellar testing reveals good finger-nose-finger and heel-to-shin bilaterally.  Gait and station: Gait is normal.  Reflexes: Deep tendon  reflexes are symmetric and normal bilaterally.    DIAGNOSTIC DATA (LABS, IMAGING, TESTING) - I reviewed patient records, labs, notes, testing and imaging myself where available.  Lab Results  Component Value Date   WBC 7.5 05/24/2021   HGB 14.0 05/24/2021   HCT 42.9 05/24/2021   MCV 89.9 05/24/2021   PLT 214 05/24/2021      Component Value Date/Time   NA 137 05/24/2021 2033   K 4.2 05/24/2021 2033   CL 105 05/24/2021 2033   CO2 23 05/24/2021 2033   GLUCOSE 79 05/24/2021 2033   BUN 17 05/24/2021 2033   CREATININE 0.70 05/24/2021 2033   CALCIUM 8.9 05/24/2021 2033   PROT 7.4 05/24/2021 2033   ALBUMIN 3.7 05/24/2021 2033   AST 79 (H) 05/24/2021 2033   ALT 23 05/24/2021 2033   ALKPHOS 105 05/24/2021 2033   BILITOT 0.4 05/24/2021 2033   GFRNONAA >60 05/24/2021 2033   GFRAA >60 02/19/2015 0510   No results found for: CHOL, HDL, LDLCALC, LDLDIRECT, TRIG, CHOLHDL No results found for: HGBA1C No results found for: VITAMINB12 No results found for: TSH      View : No data to display.               View : No data to display.           ASSESSMENT AND PLAN  56 y.o. year old female  has a past medical history of Asthma, Carpal tunnel syndrome (09/29/2014), Chronic back pain, Chronic kidney disease, Diabetes mellitus without complication ([place]), Fatty infiltration of liver (03/17/2015), Fibromyalgia, Hypertension, IBS (irritable bowel syndrome), Interstitial cystitis, Lupus ([place]), Lupus (systemic lupus erythematosus) ([place]), Migraines, and Pseudotumor cerebri. here with    Chronic migraine without aura without status migrainosus, not intractable  Fibromyalgia  Insomnia, unspecified type  Fatigue, unspecified type  Tamiah reports that headaches wax and wane but seem stable at this time. She has not had an intractable migraine since our last visit. Botox does seem  to be helping. She will continue topiramate 200mg  BID. I will try to get Roselyn Meier covered for abortive  therapy. She is allergic to triptans. She will continue fibromyalgia management as prescribed. PDMP appropriate. Oxycodone refill will be due 10/20/2021. She will focus on healthy lifestyle habits. She will follow up as scheduled for Botox with Dr Felecia Shelling.    No orders of the defined types were placed in this encounter.     Meds ordered this encounter  Medications   Ubrogepant (UBRELVY) 100 MG TABS    Sig: Take 100 mg by mouth daily as needed. Take one tablet at onset of headache, may repeat 1 tablet in 2 hours, no more than 2 tablets in 24 hours    Dispense:  8 tablet    Refill:  11    Order Specific Question:   Supervising Provider    Answer:   Melvenia Beam [id number]       Connie Presto, MSN, FNP-C 10/16/2021, 10:32 AM  [facility] [address], [place], [place] [address] [phone]

## 2021-10-17 NOTE — Telephone Encounter (Signed)
Request Reference Number: [id number]. UBRELVY TAB 100MG  is approved through 05/19/2022. Your patient may now fill this prescription and it will be covered.

## 2021-10-18 ENCOUNTER — Other Ambulatory Visit: Payer: Self-pay | Admitting: Neurology

## 2021-10-18 NOTE — Telephone Encounter (Signed)
Last OV was on 10/16/21.  Next OV is scheduled for 12/12/21.  Last RX was written on 09/19/21 for 90 tabs.   [place] [facility] Database has been reviewed.

## 2021-11-15 ENCOUNTER — Other Ambulatory Visit: Payer: Self-pay | Admitting: Neurology

## 2021-11-15 NOTE — Telephone Encounter (Signed)
Last OV was on 10/16/21.  Next OV is scheduled for 12/12/21.  Last RX was written on 10/18/21 for 90 tabs.    Drug Database has been reviewed. Please e-scribe as work in [place]. Dr. Epimenio Foot is out.

## 2021-11-29 DIAGNOSIS — Z87440 Personal history of urinary (tract) infections: Secondary | ICD-10-CM | POA: Insufficient documentation

## 2021-11-29 DIAGNOSIS — M6289 Other specified disorders of muscle: Secondary | ICD-10-CM | POA: Insufficient documentation

## 2021-12-12 ENCOUNTER — Encounter: Payer: Self-pay | Admitting: Neurology

## 2021-12-12 ENCOUNTER — Ambulatory Visit: Payer: Medicare Other | Admitting: Neurology

## 2021-12-12 VITALS — BP 154/95 | HR 94 | Ht 65.0 in | Wt 218.8 lb

## 2021-12-12 DIAGNOSIS — R5383 Other fatigue: Secondary | ICD-10-CM

## 2021-12-12 DIAGNOSIS — M542 Cervicalgia: Secondary | ICD-10-CM

## 2021-12-12 DIAGNOSIS — G43709 Chronic migraine without aura, not intractable, without status migrainosus: Secondary | ICD-10-CM

## 2021-12-12 DIAGNOSIS — M797 Fibromyalgia: Secondary | ICD-10-CM

## 2021-12-12 DIAGNOSIS — G4700 Insomnia, unspecified: Secondary | ICD-10-CM | POA: Diagnosis not present

## 2021-12-12 MED ORDER — ONABOTULINUMTOXINA 200 UNITS IJ SOLR
155.0000 [IU] | Freq: Once | INTRAMUSCULAR | Status: AC
  Administered 2021-12-12: 190 [IU] via INTRAMUSCULAR

## 2021-12-12 MED ORDER — OXYCODONE HCL 15 MG PO TABS: 15.0000 mg | ORAL_TABLET | Freq: Three times a day (TID) | ORAL | 0 refills | Status: DC | PRN

## 2021-12-12 MED ORDER — ONABOTULINUMTOXINA 200 UNITS IJ SOLR: 155.0000 [IU] | Freq: Once | INTRAMUSCULAR | Status: DC

## 2021-12-12 NOTE — Progress Notes (Signed)
[facility]  PATIENT: Connie Arnold DOB: [birth year]    HISTORICAL  CHIEF COMPLAINT:  Chief Complaint  Patient presents with   Procedure    RM 1, alone. Here for Botox.    Botulinum Toxin Injection    Botox- 200 units x 1 vial Lot: [id number] Expiration: 06/2024 NDC: 5462-7035-00  Bacteriostatic 0.9% Sodium Chloride- 15mL total Lot: [id number] Expiration: 01/19/2023 NDC: 9937-1696-78  Dx: L38.101 B/B     HISTORY OF PRESENT ILLNESS:  Connie Arnold is a 56 y.o. woman with chronic migraine headaches and other neurologic issues.    Update 12/12/2021: She noted a good benefit from the last series of Botox 3 months ago.  She did note a little bit of weakness in her neck for a few weeks but that completely resolved.  No facial weakness before Botox she had daily headaches.   She has tolerated the injections well in general.  .  She is also on topiramate to help with her chronic migraines.  HA's are pounding and associated with photophobia, phonophobia and nausea.    She also has fibromyalgia and her pain worsens if she is less active..   She is on a combination of oxycodone, duloxetine that has helped the fibromyalgia pain..     She has not escalated and is not getting opiates from other doctors. PDMP Database has been reviewed.  She is compliant.  She is also on Cymbalta for the FMS.  She is back to  exercising at the gym .    She has sleep maintenance greater and sleep onset insomnia.   She is on trazodone 100 mg po qHS.  Mood is stable.  ------------ Chronic Migraine History:   She has had chronic migraine headache with moderate pain, flaring up to severe,  for most of the past couple of years   She is having chronic migraine 30/30 days every month, lasting for more than 4 hours every day.  She has received some benefit from occipital nerve blocks, trigger point injections and medications but the headaches still persist on a daily basis for past couple years.     She had been on Botox for migraine prophylaxis in the past with benefit. However, with changes in insurance she was unable to continue due to very high co-pay.   She is currently on Topamax 200 mg by mouth twice a day and muscle relaxants and opiates.  She has occasional emergency room visits and frequent additional visits to my office for trigger point injections..  She has tried and failed multiple prophylactic medications for her chronic migraine: Antiepileptics:    zonisamide, topiramate, Keppra.   (Due to obesity, Depakote is a poor choice) Anti-depressants  amitriptyline, imipramine Anti-hypertensives:  Diamox Muscle relaxants:  baclofen, tizanidine NSAID's:  She is unable to tolerate chronic anti-inflammatories due to mild renal insufficiency.  She has had multiple visits to ER and to our office for injections when pain is more severe.     SLE:   She see Dr. Nydia Bouton at [facility].   She was diagnosed with SLE in 2006 and has been on Plaquenil x years and more recently Benlysta.    Her Complements were normal 11/17 but ANA is positive (speckled)   ALLERGIES: Allergies  Allergen Reactions   Nsaids Other (See Comments)    Chronic kidney failure   Codeine    Ramipril    Imitrex [Sumatriptan] Nausea And Vomiting    HOME MEDICATIONS:  Current Outpatient Medications:  ADVAIR DISKUS 250-50 MCG/DOSE AEPB, Inhale 1 puff into the lungs as needed. , Disp: , Rfl:    albuterol (PROVENTIL HFA;VENTOLIN HFA) 108 (90 Base) MCG/ACT inhaler, Inhale into the lungs., Disp: , Rfl:    albuterol (PROVENTIL) (2.5 MG/3ML) 0.083% nebulizer solution, , Disp: , Rfl:    Azelastine-Fluticasone 137-50 MCG/ACT SUSP, Place 1 spray into the nose as needed. , Disp: , Rfl:    baclofen (LIORESAL) 10 MG tablet, TAKE 1 TO 2 TABLETS BY  MOUTH 3 TIMES DAILY, Disp: 540 tablet, Rfl: 3   Belimumab 200 MG/ML SOAJ, Inject 1 Dose into the skin once a week. , Disp: , Rfl:    cetirizine (ZYRTEC) 10 MG tablet, Take 10 mg by mouth  daily. , Disp: , Rfl:    DULoxetine (CYMBALTA) 60 MG capsule, Take 1 capsule (60 mg total) by mouth daily., Disp: 90 capsule, Rfl: 4   fluconazole (DIFLUCAN) 150 MG tablet, Take 150 mg by mouth once. , Disp: , Rfl:    FLUoxetine (PROZAC) 40 MG capsule, Take 40 mg by mouth daily. , Disp: , Rfl:    furosemide (LASIX) 40 MG tablet, Take 40 mg by mouth daily. , Disp: , Rfl:    glucose blood test strip, , Disp: , Rfl:    hydroxychloroquine (PLAQUENIL) 200 MG tablet, Take 1 tablet (200 mg total) by mouth 2 (two) times daily., Disp: 30 tablet, Rfl: 0   ipratropium-albuterol (DUONEB) 0.5-2.5 (3) MG/3ML SOLN, 3 mLs as needed. , Disp: , Rfl:    lactulose, encephalopathy, (CHRONULAC) 10 GM/15ML SOLN, daily as needed. , Disp: , Rfl:    montelukast (SINGULAIR) 10 MG tablet, Take 10 mg by mouth at bedtime. , Disp: , Rfl:    oxyCODONE (ROXICODONE) 15 MG immediate release tablet, TAKE 1 TABLET BY MOUTH EVERY 8 HOURS AS NEEDED FOR PAIN, Disp: 90 tablet, Rfl: 0   phentermine 37.5 MG capsule, Take 1 capsule by mouth once daily in the morning, Disp: 30 capsule, Rfl: 5   promethazine (PHENERGAN) 50 MG/ML injection, Inject into the muscle as needed. , Disp: , Rfl:    topiramate (TOPAMAX) 200 MG tablet, TAKE 1 TABLET BY MOUTH  TWICE DAILY, Disp: 180 tablet, Rfl: 3   traZODone (DESYREL) 100 MG tablet, Take 2 tablets (200 mg total) by mouth at bedtime., Disp: 180 tablet, Rfl: 1   triamcinolone (NASACORT) 55 MCG/ACT AERO nasal inhaler, Place 2 sprays into the nose., Disp: , Rfl:    trimethoprim (TRIMPEX) 100 MG tablet, Take 1 tablet by mouth  daily, Disp: , Rfl:    Ubrogepant (UBRELVY) 100 MG TABS, Take 100 mg by mouth daily as needed. Take one tablet at onset of headache, may repeat 1 tablet in 2 hours, no more than 2 tablets in 24 hours, Disp: 8 tablet, Rfl: 11   Vitamin D, Ergocalciferol, (DRISDOL) 50000 UNITS CAPS capsule, Take 50,000 Units by mouth every 7 (seven) days. , Disp: , Rfl:   Current Facility-Administered  Medications:    botulinum toxin Type A (BOTOX) injection 155 Units, 155 Units, Intramuscular, Once, Sater, Pearletha Furl, MD  PAST MEDICAL HISTORY: Past Medical History:  Diagnosis Date   Asthma    Carpal tunnel syndrome 09/29/2014   Chronic back pain    Chronic kidney disease    Diabetes mellitus without complication (HCC)    Fatty infiltration of liver 03/17/2015   Fibromyalgia    Hypertension    IBS (irritable bowel syndrome)    Interstitial cystitis    Lupus (HCC)  Lupus (systemic lupus erythematosus) (HCC)    Migraines    Pseudotumor cerebri     PAST SURGICAL HISTORY: Past Surgical History:  Procedure Laterality Date   ABDOMINAL HYSTERECTOMY     APPENDECTOMY     CHOLECYSTECTOMY     CYSTOSCOPY      FAMILY HISTORY: Family History  Problem Relation Age of Onset   Graves' disease Mother    Heart disease Father    Prostate cancer Father    Healthy Brother    Healthy Maternal Aunt    Healthy Maternal Uncle     SOCIAL HISTORY:  Social History   Socioeconomic History   Marital status: Married    Spouse name: Not on file   Number of children: Not on file   Years of education: Not on file   Highest education level: Not on file  Occupational History   Not on file  Tobacco Use   Smoking status: Never   Smokeless tobacco: Never  Vaping Use   Vaping Use: Never used  Substance and Sexual Activity   Alcohol use: No   Drug use: No   Sexual activity: Not on file  Other Topics Concern   Not on file  Social History Narrative   Not on file   Social Determinants of Health   Financial Resource Strain: Not on file  Food Insecurity: Not on file  Transportation Needs: Not on file  Physical Activity: Not on file  Stress: Not on file  Social Connections: Not on file  Intimate Partner Violence: Not on file     PHYSICAL EXAM   General: The patient is an obese woman in no distress.     Neck: She has mild tenderness over the splenius capitis muscles, right  greater than left.  Neurologic Exam  Mental status: The patient is alert and oriented x 3 at the time of the examination. The patient has apparent normal recent and remote memory, with an apparently normal attention span and concentration ability.   Speech is normal.  Cranial nerves: Extraocular movements are full.     Normal facial and neck strength.  Trapezius and sternocleidomastoid strength is normal.  Motor:  Muscle bulk is normal.   Tone is normal. Strength is  5 / 5 in limbs   Gait and station: Station is normal.   Gait is normal    DIAGNOSTIC DATA (LABS, IMAGING, TESTING) - I reviewed patient records, labs, notes, testing and imaging myself where available.  Lab Results  Component Value Date   WBC 7.5 05/24/2021   HGB 14.0 05/24/2021   HCT 42.9 05/24/2021   MCV 89.9 05/24/2021   PLT 214 05/24/2021        ASSESSMENT AND PLAN    1. Chronic migraine without aura without status migrainosus, not intractable   2. Fibromyalgia   3. Insomnia, unspecified type   4. Fatigue, unspecified type   5. Neck pain      1.   Inject Botox 200 units as follows:: 115 units into the muscles of the face and head as follows: Frontalis (5 units x 2 on the left, 5 units x 2 on the right), upper frontalis (5 units x 2 on the left, 5 units x 2 on the right) procerus and corrugators (5 units x 3), temporalis (5 units x 4 on the left and 5 units x 4 on the right), occipitalis (5 units x 2 on the left and 5 units x 2 on the right)  75 Units into the  muscles of the neck as follows: Splenius capitis (10 units x 2), Splenius  cervicus (7.5 units  units x 2), trapezius (10 units x 2); rhomboid 10 U x 2   10 U wasted  2.  Continue oxycodone and Cymbalta for fibromyalgia pain.  I will renew the prescription and postdated to 03/22/2021.Marland Kitchen  The PDMP was reviewed.  She has not been getting medications from other practices.  3.   Stay active and exercise as tolerated. 4.   return in 3 months or sooner  if there are new or worsening neurologic symptoms.  Richard A. Epimenio Foot, MD, PhD 12/12/2021, 3:35 PM Certified in Neurology, Clinical Neurophysiology, Sleep Medicine, Pain Medicine and Neuroimaging  [facility] [address] [place], [place] [address] [phone]

## 2022-01-15 ENCOUNTER — Other Ambulatory Visit: Payer: Self-pay | Admitting: Neurology

## 2022-02-11 ENCOUNTER — Other Ambulatory Visit: Payer: Self-pay | Admitting: Neurology

## 2022-03-12 ENCOUNTER — Ambulatory Visit: Payer: Medicare Other | Admitting: Neurology

## 2022-03-15 ENCOUNTER — Other Ambulatory Visit: Payer: Self-pay | Admitting: Neurology

## 2022-03-18 ENCOUNTER — Other Ambulatory Visit (HOSPITAL_COMMUNITY): Payer: Self-pay

## 2022-03-18 ENCOUNTER — Telehealth: Payer: Self-pay | Admitting: Neurology

## 2022-03-18 ENCOUNTER — Ambulatory Visit: Payer: Medicare Other | Admitting: Neurology

## 2022-03-18 DIAGNOSIS — G43709 Chronic migraine without aura, not intractable, without status migrainosus: Secondary | ICD-10-CM

## 2022-03-18 MED ORDER — ONABOTULINUMTOXINA 200 UNITS IJ SOLR
INTRAMUSCULAR | 2 refills | Status: DC
  Filled 2022-03-18: qty 1, 84d supply, fill #0
  Filled 2022-06-03 – 2022-06-06 (×2): qty 1, 84d supply, fill #1
  Filled 2022-09-25: qty 1, 84d supply, fill #2

## 2022-03-18 NOTE — Telephone Encounter (Signed)
Patient Advocate Encounter  Prior Authorization for Botox 200UNIT solution has been approved.    PA# [id number] Key: [id number] Effective dates: 03/18/2022 through 06/18/2022  Can be filled at [place], [place] [facility] [place] Patient Advocate Team Direct Number: [phone]  Fax: [phone]

## 2022-03-18 NOTE — Telephone Encounter (Signed)
Rx sent by Vita Barley, CMA to MD this am to e-scribe to pharmacy.

## 2022-03-18 NOTE — Telephone Encounter (Addendum)
I tried calling [employer] to do auth on pt botox. They are currently closed. Here is previously PA approval info:  "PA is not required for CPT 64615, only J0585. Reference [phone]. I submitted PA request via [facility] portal for 155 units of Botox for G43.709, every 12 weeks. Request was approved, PA #[id number] (03/14/21- 03/14/22). PA phone: [phone]."  Pt r/s last week from 03/12/22 to 03/18/22 at 1pm d/t being sick. I will send urgent request to auth team to see if they can get approval ASAP.    Urgent reauth request  Chronic Migraine CPT 64615  Botox J0585 Units:200  G43.709 Chronic migraine without aura without status migrainosus, not intractable

## 2022-03-18 NOTE — Telephone Encounter (Signed)
Botox rx sent to [facility]

## 2022-03-18 NOTE — Addendum Note (Signed)
Addended by: Wyvonnia Lora on: 03/18/2022 10:36 AM   Modules accepted: Orders

## 2022-03-18 NOTE — Telephone Encounter (Signed)
Pt would like a refill of her oxyCODONE (ROXICODONE) 15 MG immediate release tablet [00370]

## 2022-03-18 NOTE — Telephone Encounter (Signed)
Pt has a botox appointment for today at 1:00pm and per the appointment notes the PA for botox expired on 03/14/22. Should this appointment be cancelled and rescheduled? Please advise.

## 2022-03-19 ENCOUNTER — Other Ambulatory Visit (HOSPITAL_COMMUNITY): Payer: Self-pay

## 2022-03-21 ENCOUNTER — Encounter: Payer: Self-pay | Admitting: Neurology

## 2022-03-21 ENCOUNTER — Ambulatory Visit: Payer: Medicare Other | Admitting: Neurology

## 2022-03-21 VITALS — BP 138/79 | HR 78 | Ht 65.0 in | Wt 219.0 lb

## 2022-03-21 DIAGNOSIS — G43709 Chronic migraine without aura, not intractable, without status migrainosus: Secondary | ICD-10-CM

## 2022-03-21 MED ORDER — ONABOTULINUMTOXINA 200 UNITS IJ SOLR: 155.0000 [IU] | Freq: Once | INTRAMUSCULAR | Status: DC

## 2022-03-21 NOTE — Progress Notes (Signed)
[facility]  PATIENT: Connie Arnold DOB: [birth year]    HISTORICAL  CHIEF COMPLAINT:  Chief Complaint  Patient presents with   Procedure    RM 16, alone. Here for Botox.     HISTORY OF PRESENT ILLNESS:  Connie Arnold is a 56 y.o. woman with chronic migraine headaches and other neurologic issues.    Update 03/21/2022: She continues to get a benefit from Botox.  Benefit was excellent x 10 weeks with more HA the last 2 weeks.  .  Before Botox she had daily headaches.   She has tolerated the injections well.  Specifically, no eyelid drooping or neck weakness.  She is also on topiramate to help with her chronic migraines.  HA's are pounding and associated with photophobia, phonophobia and nausea.    She also has fibromyalgia and her pain worsens if she is less active..   She is on a combination of oxycodone, duloxetine that has helped the fibromyalgia pain..     She has not escalated and is not getting opiates from other doctors. PDMP Database has been reviewed.  She is also on Cymbalta for the FMS.  She is back to  exercising at the gym .    She has sleep maintenance greater and sleep onset insomnia.   She is on trazodone 100 mg po qHS.  Mood is stable.  She is on Benlysta for SLE.  Due to depression she is doing counseling with soe benefit   ------------ Chronic Migraine History:   She has had chronic migraine headache with moderate pain, flaring up to severe,  for most of the past couple of years   She is having chronic migraine 30/30 days every month, lasting for more than 4 hours every day.  She has received some benefit from occipital nerve blocks, trigger point injections and medications but the headaches still persist on a daily basis for past couple years.    She had been on Botox for migraine prophylaxis in the past with benefit. However, with changes in insurance she was unable to continue due to very high co-pay.   She is currently on Topamax 200 mg by  mouth twice a day and muscle relaxants and opiates.  She has occasional emergency room visits and frequent additional visits to my office for trigger point injections..  She has tried and failed multiple prophylactic medications for her chronic migraine: Antiepileptics:    zonisamide, topiramate, Keppra.   (Due to obesity, Depakote is a poor choice) Anti-depressants  amitriptyline, imipramine Anti-hypertensives:  Diamox Muscle relaxants:  baclofen, tizanidine NSAID's:  She is unable to tolerate chronic anti-inflammatories due to mild renal insufficiency.  She has had multiple visits to ER and to our office for injections when pain is more severe.     SLE:   She see Dr. Nydia Bouton at [facility].   She was diagnosed with SLE in 2006 and has been on Plaquenil x years and more recently Benlysta.    Her Complements were normal 11/17 but ANA is positive (speckled)   ALLERGIES: Allergies  Allergen Reactions   Nsaids Other (See Comments)    Chronic kidney failure   Codeine    Ramipril    Imitrex [Sumatriptan] Nausea And Vomiting    HOME MEDICATIONS:  Current Outpatient Medications:    ADVAIR DISKUS 250-50 MCG/DOSE AEPB, Inhale 1 puff into the lungs as needed. , Disp: , Rfl:    albuterol (PROVENTIL HFA;VENTOLIN HFA) 108 (90 Base) MCG/ACT inhaler, Inhale into the lungs., Disp: ,  Rfl:    albuterol (PROVENTIL) (2.5 MG/3ML) 0.083% nebulizer solution, , Disp: , Rfl:    Azelastine-Fluticasone 137-50 MCG/ACT SUSP, Place 1 spray into the nose as needed. , Disp: , Rfl:    baclofen (LIORESAL) 10 MG tablet, TAKE 1 TO 2 TABLETS BY  MOUTH 3 TIMES DAILY, Disp: 540 tablet, Rfl: 3   Belimumab 200 MG/ML SOAJ, Inject 1 Dose into the skin once a week. , Disp: , Rfl:    bupivacaine (MARCAINE PRESERVATIVE FREE) 0.5 % SOLN injection, , Disp: , Rfl:    cetirizine (ZYRTEC) 10 MG tablet, Take 10 mg by mouth daily. , Disp: , Rfl:    DULoxetine (CYMBALTA) 60 MG capsule, Take 1 capsule (60 mg total) by mouth daily., Disp: 90  capsule, Rfl: 4   fluconazole (DIFLUCAN) 150 MG tablet, Take 150 mg by mouth once. , Disp: , Rfl:    FLUoxetine (PROZAC) 40 MG capsule, Take 40 mg by mouth daily. , Disp: , Rfl:    furosemide (LASIX) 40 MG tablet, Take 40 mg by mouth daily. , Disp: , Rfl:    glucose blood test strip, , Disp: , Rfl:    heparin 86578 UNIT/ML injection, Instill 4 cc into bladder 2x daily, Disp: , Rfl:    hydroxychloroquine (PLAQUENIL) 200 MG tablet, Take 1 tablet (200 mg total) by mouth 2 (two) times daily., Disp: 30 tablet, Rfl: 0   ipratropium-albuterol (DUONEB) 0.5-2.5 (3) MG/3ML SOLN, 3 mLs as needed. , Disp: , Rfl:    lactulose, encephalopathy, (CHRONULAC) 10 GM/15ML SOLN, daily as needed. , Disp: , Rfl:    montelukast (SINGULAIR) 10 MG tablet, Take 10 mg by mouth at bedtime. , Disp: , Rfl:    oxyCODONE (ROXICODONE) 15 MG immediate release tablet, Take 1 tablet (15 mg total) by mouth every 8 (eight) hours as needed. for pain, Disp: 90 tablet, Rfl: 0   phentermine 37.5 MG capsule, Take 1 capsule by mouth once daily in the morning, Disp: 30 capsule, Rfl: 5   promethazine (PHENERGAN) 50 MG/ML injection, Inject into the muscle as needed. , Disp: , Rfl:    Rimegepant Sulfate (NURTEC) 75 MG TBDP, Take 75 mg by mouth daily as needed (take for abortive therapy of migraine, no more than 1 tablet in 24 hours or 10 per month)., Disp: 8 tablet, Rfl: 11   Rimegepant Sulfate (NURTEC) 75 MG TBDP, Take 75 mg by mouth daily as needed (take for abortive therapy of migraine, no more than 1 tablet in 24 hours or 10 per month)., Disp: 6 tablet, Rfl: 0   topiramate (TOPAMAX) 200 MG tablet, TAKE 1 TABLET BY MOUTH  TWICE DAILY, Disp: 180 tablet, Rfl: 3   traZODone (DESYREL) 100 MG tablet, Take 2 tablets (200 mg total) by mouth at bedtime., Disp: 180 tablet, Rfl: 1   triamcinolone (NASACORT) 55 MCG/ACT AERO nasal inhaler, Place 2 sprays into the nose., Disp: , Rfl:    trimethoprim (TRIMPEX) 100 MG tablet, Take 1 tablet by mouth  daily,  Disp: , Rfl:    Vitamin D, Ergocalciferol, (DRISDOL) 50000 UNITS CAPS capsule, Take 50,000 Units by mouth every 7 (seven) days. , Disp: , Rfl:   PAST MEDICAL HISTORY: Past Medical History:  Diagnosis Date   Asthma    Carpal tunnel syndrome 09/29/2014   Chronic back pain    Chronic kidney disease    Diabetes mellitus without complication (HCC)    Fatty infiltration of liver 03/17/2015   Fibromyalgia    Hypertension    IBS (  irritable bowel syndrome)    Interstitial cystitis    Lupus (HCC)    Lupus (systemic lupus erythematosus) (HCC)    Migraines    Pseudotumor cerebri     PAST SURGICAL HISTORY: Past Surgical History:  Procedure Laterality Date   ABDOMINAL HYSTERECTOMY     APPENDECTOMY     CHOLECYSTECTOMY     CYSTOSCOPY      FAMILY HISTORY: Family History  Problem Relation Age of Onset   Graves' disease Mother    Heart disease Father    Prostate cancer Father    Healthy Brother    Healthy Maternal Aunt    Healthy Maternal Uncle     SOCIAL HISTORY:  Social History   Socioeconomic History   Marital status: Married    Spouse name: Not on file   Number of children: Not on file   Years of education: Not on file   Highest education level: Not on file  Occupational History   Not on file  Tobacco Use   Smoking status: Never   Smokeless tobacco: Never  Vaping Use   Vaping Use: Never used  Substance and Sexual Activity   Alcohol use: No   Drug use: No   Sexual activity: Not on file  Other Topics Concern   Not on file  Social History Narrative   Not on file   Social Determinants of Health   Financial Resource Strain: Not on file  Food Insecurity: Not on file  Transportation Needs: Not on file  Physical Activity: Not on file  Stress: Not on file  Social Connections: Not on file  Intimate Partner Violence: Not on file     PHYSICAL EXAM   General: The patient is in no distress.     Neck: She has mild tenderness over the splenius capitis muscles,  right greater than left.  Neurologic Exam  Mental status: The patient is alert and oriented x 3 at the time of the examination. The patient has apparent normal recent and remote memory, with an apparently normal attention span and concentration ability.   Speech is normal.  Cranial nerves: Extraocular movements are full.     Facial strength is normal.  Trapezius and sternocleidomastoid strength is normal.  Motor:  Muscle bulk is normal.   Tone is normal. Strength is  5 / 5 in limbs   Gait and station: Station is normal.   Gait is normal    DIAGNOSTIC DATA (LABS, IMAGING, TESTING) - I reviewed patient records, labs, notes, testing and imaging myself where available.  Lab Results  Component Value Date   WBC 7.5 05/24/2021   HGB 14.0 05/24/2021   HCT 42.9 05/24/2021   MCV 89.9 05/24/2021   PLT 214 05/24/2021        ASSESSMENT AND PLAN    1. Chronic migraine without aura without status migrainosus, not intractable   2. Neck pain   3. Fibromyalgia      1.   Inject Botox 200 units as follows:: 115 units into the muscles of the face and head as follows: Frontalis (5 units x 2 on the left, 5 units x 2 on the right), upper frontalis (5 units x 2 on the left, 5 units x 2 on the right) procerus and corrugators (5 units x 3), temporalis (5 units x 4 on the left and 5 units x 4 on the right), occipitalis (5 units x 2 on the left and 5 units x 2 on the right) 85 Units into the muscles  of the neck as follows: Splenius capitis (12.5 units left (15 units right), Splenius  cervicus (7.5 units left and 10 units right), trapezius (10 units x 2); rhomboid 10 U x 2  2.  Continue oxycodone and Cymbalta for FMS pain. She has been compiant and she has not been getting medications from other practices.  3.   Stay active and exercise as tolerated. 4.   return in 3 months or sooner if there are new or worsening neurologic symptoms.  Chelsae Zanella A. Epimenio Foot, MD, PhD 09/13/2021, 11:26 AM Certified in  Neurology, Clinical Neurophysiology, Sleep Medicine, Pain Medicine and Neuroimaging  [facility] [address] [place], [place] [address] [phone]

## 2022-03-21 NOTE — Progress Notes (Signed)
Botox- 200 units x 1 vial Lot: [id number] Expiration: 06/2024 NDC: 1751-0258-52  Bacteriostatic 0.9% Sodium Chloride- 28mL total Lot: [id number] Expiration: 04/19/2022 NDC: 3536-1443-15  Dx: Q00.867 S/P

## 2022-04-01 ENCOUNTER — Other Ambulatory Visit: Payer: Self-pay

## 2022-04-01 MED ORDER — BACLOFEN 10 MG PO TABS: ORAL_TABLET | ORAL | 3 refills | Status: DC

## 2022-04-01 MED ORDER — TOPIRAMATE 200 MG PO TABS: 200.0000 mg | ORAL_TABLET | Freq: Two times a day (BID) | ORAL | 3 refills | Status: DC

## 2022-04-01 MED ORDER — TRAZODONE HCL 100 MG PO TABS: 200.0000 mg | ORAL_TABLET | Freq: Every day | ORAL | 1 refills | Status: DC

## 2022-04-15 ENCOUNTER — Other Ambulatory Visit: Payer: Self-pay | Admitting: Neurology

## 2022-04-15 NOTE — Telephone Encounter (Signed)
Patient is up to date on her appointments. Patient is due for a refill on oxycodone. [place] Controlled Substance Registry checked and is appropriate.

## 2022-04-23 ENCOUNTER — Emergency Department (HOSPITAL_BASED_OUTPATIENT_CLINIC_OR_DEPARTMENT_OTHER)
Admission: EM | Admit: 2022-04-23 | Discharge: 2022-04-23 | Disposition: A | Discharge status: Home / Self Care | Payer: Medicare Other | Attending: Emergency Medicine | Admitting: Emergency Medicine

## 2022-04-23 ENCOUNTER — Emergency Department (HOSPITAL_BASED_OUTPATIENT_CLINIC_OR_DEPARTMENT_OTHER): Payer: Medicare Other

## 2022-04-23 ENCOUNTER — Encounter (HOSPITAL_BASED_OUTPATIENT_CLINIC_OR_DEPARTMENT_OTHER): Payer: Self-pay

## 2022-04-23 ENCOUNTER — Other Ambulatory Visit: Payer: Self-pay

## 2022-04-23 DIAGNOSIS — Z79899 Other long term (current) drug therapy: Secondary | ICD-10-CM | POA: Insufficient documentation

## 2022-04-23 DIAGNOSIS — U071 COVID-19: Secondary | ICD-10-CM | POA: Diagnosis not present

## 2022-04-23 DIAGNOSIS — E119 Type 2 diabetes mellitus without complications: Secondary | ICD-10-CM | POA: Insufficient documentation

## 2022-04-23 DIAGNOSIS — R509 Fever, unspecified: Secondary | ICD-10-CM | POA: Diagnosis present

## 2022-04-23 LAB — RESP PANEL BY RT-PCR (FLU A&B, COVID) ARPGX2
Influenza A by PCR: NEGATIVE
Influenza B by PCR: NEGATIVE
SARS Coronavirus 2 by RT PCR: POSITIVE — AB

## 2022-04-23 MED ORDER — ACETAMINOPHEN 325 MG PO TABS
650.0000 mg | ORAL_TABLET | Freq: Once | ORAL | Status: AC | PRN
  Administered 2022-04-23: 650 mg via ORAL
  Filled 2022-04-23: qty 2

## 2022-04-23 MED ORDER — MOLNUPIRAVIR EUA 200MG CAPSULE: 4.0000 | ORAL_CAPSULE | Freq: Two times a day (BID) | ORAL | 0 refills | Status: AC

## 2022-04-23 NOTE — ED Provider Notes (Signed)
[facility] EMERGENCY DEPARTMENT Provider Note   CSN: [id number] Arrival date & time: 04/23/22  1501     History  Chief Complaint  Patient presents with   Shortness of Breath   Fever    Connie Arnold is a 56 y.o. female.  HPI 56 year old female presents with fever and multiple other complaints.  Symptoms started 3 days ago.  She is having cough, back pain, headache, body aches, and fever.  She is also congested.  Went to urgent care yesterday and was started on Augmentin for a viral infection and also given Tylenol and Tessalon pearls.  Is not feeling better today so the urgent care recommended she come here.  No neck stiffness.  She has multiple comorbidities which include pseudotumor, chronic migraines, diabetes, lupus.  Home Medications Prior to Admission medications   Medication Sig Start Date End Date Taking? Authorizing Provider  molnupiravir EUA (LAGEVRIO) 200 mg CAPS capsule Take 4 capsules (800 mg total) by mouth 2 (two) times daily for 5 days. 04/23/22 04/28/22 Yes Pricilla Loveless, MD  albuterol (PROVENTIL HFA;VENTOLIN HFA) 108 (90 Base) MCG/ACT inhaler Inhale into the lungs. 10/21/16   [provider]  albuterol (PROVENTIL) (2.5 MG/3ML) 0.083% nebulizer solution  07/27/14   [provider]  Azelastine-Fluticasone 137-50 MCG/ACT SUSP Place 1 spray into the nose as needed.     [provider]  baclofen (LIORESAL) 10 MG tablet TAKE 1 TO 2 TABLETS BY  MOUTH 3 TIMES DAILY 04/01/22   Sater, Pearletha Furl, MD  Belimumab 200 MG/ML SOAJ Inject 1 Dose into the skin once a week.     [provider]  botulinum toxin Type A (BOTOX) 200 units injection Inject 155units to head and neck intramuscularly every 3 months. To be administered by provider in office. Discard any unused portion. 03/18/22   Sater, Pearletha Furl, MD  cetirizine (ZYRTEC) 10 MG tablet Take 10 mg by mouth daily.     [provider]  DULoxetine (CYMBALTA) 60 MG capsule Take 1  capsule (60 mg total) by mouth daily. 03/09/18   Sater, Pearletha Furl, MD  fluconazole (DIFLUCAN) 150 MG tablet Take 150 mg by mouth once.     [provider]  FLUoxetine (PROZAC) 40 MG capsule Take 40 mg by mouth daily.  11/01/16   [provider]  furosemide (LASIX) 40 MG tablet Take 40 mg by mouth daily.  08/05/14   [provider]  glucose blood test strip  11/24/14   [provider]  hydroxychloroquine (PLAQUENIL) 200 MG tablet Take 1 tablet (200 mg total) by mouth 2 (two) times daily. 01/05/14   Teressa Lower, NP  ipratropium-albuterol (DUONEB) 0.5-2.5 (3) MG/3ML SOLN 3 mLs as needed.     [provider]  lactulose, encephalopathy, (CHRONULAC) 10 GM/15ML SOLN daily as needed.  08/26/14   [provider]  montelukast (SINGULAIR) 10 MG tablet Take 10 mg by mouth at bedtime.  11/01/16   [provider]  oxyCODONE (ROXICODONE) 15 MG immediate release tablet TAKE ONE TABLET BY MOUTH EVERY EIGHT HOURS AS NEEDED 04/15/22   Sater, Pearletha Furl, MD  phentermine 37.5 MG capsule Take 1 capsule by mouth once daily in the morning 03/20/21   Sater, Pearletha Furl, MD  promethazine (PHENERGAN) 50 MG/ML injection Inject into the muscle as needed.  03/03/09   [provider]  topiramate (TOPAMAX) 200 MG tablet Take 1 tablet (200 mg total) by mouth 2 (two) times daily. 04/01/22   Sater, Pearletha Furl, MD  traZODone (DESYREL) 100 MG tablet Take 2 tablets (200 mg total) by mouth at bedtime. 04/01/22   Sater, Pearletha Furl, MD  triamcinolone (NASACORT) 55 MCG/ACT AERO nasal inhaler Place 2 sprays into the nose.    [provider]  trimethoprim (TRIMPEX) 100 MG tablet Take 1 tablet by mouth  daily 10/27/14   [provider]  Ubrogepant (UBRELVY) 100 MG TABS Take 100 mg by mouth daily as needed. Take one tablet at onset of headache, may repeat 1 tablet in 2 hours, no more than 2 tablets in 24 hours 10/16/21   Lomax, Amy, NP  Vitamin D, Ergocalciferol,  (DRISDOL) 50000 UNITS CAPS capsule Take 50,000 Units by mouth every 7 (seven) days.     [provider]      Allergies    Nsaids, Codeine, Ramipril, and Imitrex [sumatriptan]    Review of Systems   Review of Systems  Constitutional:  Positive for fever.  HENT:  Positive for congestion.   Respiratory:  Positive for cough and shortness of breath.   Musculoskeletal:  Positive for myalgias. Negative for neck stiffness.  Neurological:  Positive for headaches.    Physical Exam Updated Vital Signs BP (!) 124/98 (BP Location: Left Arm)   Pulse (!) 108   Temp (!) 100.8 F (38.2 C)   Resp 20   Ht 5\' 5"  (1.651 m)   Wt 97.1 kg   SpO2 96%   BMI 35.61 kg/m  Physical Exam Vitals and nursing note reviewed.  Constitutional:      Appearance: She is well-developed.  HENT:     Head: Normocephalic and atraumatic.  Cardiovascular:     Rate and Rhythm: Normal rate and regular rhythm.     Heart sounds: Normal heart sounds.  Pulmonary:     Effort: Pulmonary effort is normal.     Breath sounds: Normal breath sounds. No wheezing or rhonchi.  Abdominal:     Palpations: Abdomen is soft.     Tenderness: There is no abdominal tenderness.  Musculoskeletal:     Cervical back: Normal range of motion. No rigidity. Normal range of motion.  Skin:    General: Skin is warm and dry.  Neurological:     Mental Status: She is alert.     Comments: CN 3-12 grossly intact. 5/5 strength in all 4 extremities. Grossly normal sensation. Normal finger to nose.      ED Results / Procedures / Treatments   Labs (all labs ordered are listed, but only abnormal results are displayed) Labs Reviewed  RESP PANEL BY RT-PCR (FLU A&B, COVID) ARPGX2 - Abnormal; Notable for the following components:      Result Value   SARS Coronavirus 2 by RT PCR POSITIVE (*)    All other components within normal limits    EKG EKG Interpretation  Date/Time:  Tuesday April 23 2022 15:23:22 EST Ventricular Rate:  103 PR  Interval:  112 QRS Duration: 100 QT Interval:  336 QTC Calculation: 440 R Axis:   -4 Text Interpretation: Sinus tachycardia Right atrial enlargement Minimal voltage criteria for LVH, may be normal variant ( R in aVL ) Cannot rule out Anterior infarct , age undetermined ST & T wave abnormality, consider inferior ischemia ST/T changes and tachycardia new since Jan 2023 Confirmed by Feb 2023 [phone]) on 04/23/2022 3:39:06 PM  Radiology DG Chest Portable 1 View  Result Date: 04/23/2022 CLINICAL DATA:  Shortness of breath, asthma EXAM: PORTABLE CHEST 1 VIEW COMPARISON:  05/24/2021 FINDINGS: Mild cardiomegaly. Mild diffuse interstitial  opacity. Unchanged elevation of the left hemidiaphragm. Osseous structures unremarkable. IMPRESSION: Cardiomegaly with mild, diffuse interstitial opacity, which may reflect edema or atypical/viral infection. No focal airspace opacity. Electronically Signed   By: Jearld Lesch M.D.   On: 04/23/2022 15:42    Procedures Procedures    Medications Ordered in ED Medications  acetaminophen (TYLENOL) tablet 650 mg (650 mg Oral Given 04/23/22 1525)    ED Course/ Medical Decision Making/ A&P                           Medical Decision Making Amount and/or Complexity of Data Reviewed Labs: ordered.    Details: COVID test is positive Radiology: ordered and independent interpretation performed.    Details: No lobar pneumonia  Risk OTC drugs.   Patient was given Tylenol in triage.  She has some mild tachycardia but this goes along with her fever.  Her COVID test is positive which explains her symptoms.  No bacterial pneumonia on her x-ray.  At this point, I discussed that she does not need to take the Augmentin as this would help with either COVID or URI.  However given she has significant comorbidities including lupus and diabetes, she is at higher risk for progression of COVID and so I recommended she take an antiviral.  Due to her medicines, she has  contraindications to Paxlovid (oxycodone and Ubrelvy).  We will give molnupiravir.  Otherwise she appears stable for discharge home with return precautions.        Final Clinical Impression(s) / ED Diagnoses Final diagnoses:  COVID-19    Rx / DC Orders ED Discharge Orders          Ordered    molnupiravir EUA (LAGEVRIO) 200 mg CAPS capsule  2 times daily        04/23/22 1722              Pricilla Loveless, MD 04/23/22 1734

## 2022-04-23 NOTE — Discharge Instructions (Signed)
If you develop high fever, severe cough or cough with blood, trouble breathing, severe headache, neck pain/stiffness, vomiting, or any other new/concerning symptoms then return to the ER for evaluation  

## 2022-04-23 NOTE — ED Triage Notes (Signed)
C/o headache, cough, SOB, fever, back ache & chest discomfort since Saturday. Around friend who tested positive for COVID. Last took tylenol at 0830.

## 2022-04-24 ENCOUNTER — Telehealth: Payer: Self-pay | Admitting: *Deleted

## 2022-04-24 NOTE — Telephone Encounter (Signed)
"  Request Reference Number: [id number]. UBRELVY TAB 100MG  is approved through 05/20/2023. Your patient may now fill this prescription and it will be covered."

## 2022-04-24 NOTE — Telephone Encounter (Signed)
Submitted PA Ubrelvy on covermymeds. Key: [id number]. Waiting on determination from [employer].

## 2022-05-07 DIAGNOSIS — K219 Gastro-esophageal reflux disease without esophagitis: Secondary | ICD-10-CM | POA: Insufficient documentation

## 2022-05-10 ENCOUNTER — Other Ambulatory Visit: Payer: Self-pay | Admitting: Neurology

## 2022-06-03 ENCOUNTER — Other Ambulatory Visit (HOSPITAL_COMMUNITY): Payer: Self-pay

## 2022-06-06 ENCOUNTER — Other Ambulatory Visit: Payer: Self-pay

## 2022-06-06 ENCOUNTER — Other Ambulatory Visit (HOSPITAL_COMMUNITY): Payer: Self-pay

## 2022-06-07 ENCOUNTER — Other Ambulatory Visit (HOSPITAL_COMMUNITY): Payer: Self-pay

## 2022-06-10 ENCOUNTER — Ambulatory Visit: Payer: Medicare Other | Admitting: Neurology

## 2022-06-10 ENCOUNTER — Telehealth: Payer: Self-pay | Admitting: Neurology

## 2022-06-10 ENCOUNTER — Other Ambulatory Visit (HOSPITAL_COMMUNITY): Payer: Self-pay

## 2022-06-10 NOTE — Telephone Encounter (Signed)
Botox One-Benefit Verification [id number] Submitted!

## 2022-06-10 NOTE — Telephone Encounter (Signed)
Pharmacy Patient Advocate Encounter   Received notification from [facility] that prior authorization for Botox 200 Unit solution is required/requested.    PA submitted on 06/10/2022 to (ins) [employer]  via CoverMyMeds Key [id number] Status is pending

## 2022-06-10 NOTE — Telephone Encounter (Signed)
Needs Botox re-auth.   Chronic Migraine CPT 64615    Botox J0585 Units:200   G43.709 Chronic migraine w/o aura w/o status migrainosus, not intractable

## 2022-06-10 NOTE — Telephone Encounter (Signed)
Can you please start a new botox PA for this patient, hers will expire 1/30. thanks

## 2022-06-11 ENCOUNTER — Other Ambulatory Visit (HOSPITAL_COMMUNITY): Payer: Self-pay

## 2022-06-12 ENCOUNTER — Other Ambulatory Visit: Payer: Self-pay | Admitting: Neurology

## 2022-06-18 ENCOUNTER — Ambulatory Visit: Payer: Medicare Other | Admitting: Neurology

## 2022-06-18 ENCOUNTER — Other Ambulatory Visit (HOSPITAL_COMMUNITY): Payer: Self-pay

## 2022-06-18 DIAGNOSIS — G4700 Insomnia, unspecified: Secondary | ICD-10-CM

## 2022-06-18 DIAGNOSIS — M797 Fibromyalgia: Secondary | ICD-10-CM | POA: Diagnosis not present

## 2022-06-18 DIAGNOSIS — Z6836 Body mass index (BMI) 36.0-36.9, adult: Secondary | ICD-10-CM

## 2022-06-18 DIAGNOSIS — G43709 Chronic migraine without aura, not intractable, without status migrainosus: Secondary | ICD-10-CM | POA: Diagnosis not present

## 2022-06-18 DIAGNOSIS — M542 Cervicalgia: Secondary | ICD-10-CM | POA: Diagnosis not present

## 2022-06-18 MED ORDER — PHENTERMINE HCL 37.5 MG PO CAPS: ORAL_CAPSULE | ORAL | 5 refills | Status: DC

## 2022-06-18 MED ORDER — ONABOTULINUMTOXINA 200 UNITS IJ SOLR
155.0000 [IU] | Freq: Once | INTRAMUSCULAR | Status: AC
  Administered 2022-06-18: 155 [IU] via INTRAMUSCULAR

## 2022-06-18 NOTE — Progress Notes (Signed)
[facility]  PATIENT: Connie Arnold DOB: 1966-02-29    HISTORICAL  CHIEF COMPLAINT:  Chief Complaint  Patient presents with   Follow-up    Patient is in room 11 here for botox injection.    HISTORY OF PRESENT ILLNESS:  Connie Arnold is a 57 y.o. woman with chronic migraine headaches and other neurologic issues.    Update 06/18/2022: She continues to note benefit from Botox injections.  Headaches were doing well after the last series (03/21/2022) but she had COVID at the end of December and headaches increase.    Before Botox she had daily headaches.   She has tolerated the injections well.  Specifically, no eyelid drooping or neck weakness.  She is also on topiramate to help with her chronic migraines.  HA's are pounding and associated with photophobia, phonophobia and nausea.    She also has fibromyalgia and her pain worsens if she is less active..   She is on a combination of oxycodone, duloxetine that has helped the fibromyalgia pain..     She has not escalated and is not getting opiates from other doctors.  The PDMP Database was reviewed today.  She is also on Cymbalta for the FMS.  She is back to  exercising at the gym .    She has sleep maintenance greater and sleep onset insomnia.   She is on trazodone 100 mg po qHS.  Mood is stable.  She is on Benlysta for SLE.  Due to depression she is doing counseling with [employer] benefit  She had stopped phentermine when she went on Ozempic but was unable to lose weight on Ozempic and would like to go back to phentermine   ------------ Chronic Migraine History:   She has had chronic migraine headache with moderate pain, flaring up to severe,  for most of the past couple of years   She is having chronic migraine 30/30 days every month, lasting for more than 4 hours every day.  She has received some benefit from occipital nerve blocks, trigger point injections and medications but the headaches still persist on a daily basis  for past couple years.    She had been on Botox for migraine prophylaxis in the past with benefit. However, with changes in insurance she was unable to continue due to very high co-pay.   She is currently on Topamax 200 mg by mouth twice a day and muscle relaxants and opiates.  She has occasional emergency room visits and frequent additional visits to my office for trigger point injections..  She has tried and failed multiple prophylactic medications for her chronic migraine: Antiepileptics:    zonisamide, topiramate, Keppra.   (Due to obesity, Depakote is a poor choice) Anti-depressants  amitriptyline, imipramine Anti-hypertensives:  Diamox Muscle relaxants:  baclofen, tizanidine NSAID's:  She is unable to tolerate chronic anti-inflammatories due to mild renal insufficiency.  She has had multiple visits to ER and to our office for injections when pain is more severe.     SLE:   She see Dr. Nydia Bouton at [facility].   She was diagnosed with SLE in 2006 and has been on Plaquenil x years and more recently Benlysta.    Her Complements were normal 11/17 but ANA is positive (speckled)   ALLERGIES: Allergies  Allergen Reactions   Nsaids Other (See Comments)    Chronic kidney failure   Codeine    Ramipril    Imitrex [Sumatriptan] Nausea And Vomiting    HOME MEDICATIONS:  Current Outpatient  Medications:    albuterol (PROVENTIL HFA;VENTOLIN HFA) 108 (90 Base) MCG/ACT inhaler, Inhale into the lungs., Disp: , Rfl:    albuterol (PROVENTIL) (2.5 MG/3ML) 0.083% nebulizer solution, , Disp: , Rfl:    Azelastine-Fluticasone 137-50 MCG/ACT SUSP, Place 1 spray into the nose as needed. , Disp: , Rfl:    baclofen (LIORESAL) 10 MG tablet, TAKE 1 TO 2 TABLETS BY  MOUTH 3 TIMES DAILY, Disp: 540 tablet, Rfl: 3   Belimumab 200 MG/ML SOAJ, Inject 1 Dose into the skin once a week. , Disp: , Rfl:    botulinum toxin Type A (BOTOX) 200 units injection, Inject 155units to head and neck intramuscularly every 3 months. To be  administered by provider in office. Discard any unused portion., Disp: 1 each, Rfl: 2   cetirizine (ZYRTEC) 10 MG tablet, Take 10 mg by mouth daily. , Disp: , Rfl:    DULoxetine (CYMBALTA) 60 MG capsule, Take 1 capsule (60 mg total) by mouth daily., Disp: 90 capsule, Rfl: 4   fluconazole (DIFLUCAN) 150 MG tablet, Take 150 mg by mouth once. , Disp: , Rfl:    FLUoxetine (PROZAC) 40 MG capsule, Take 40 mg by mouth daily. , Disp: , Rfl:    furosemide (LASIX) 40 MG tablet, Take 40 mg by mouth daily. , Disp: , Rfl:    glucose blood test strip, , Disp: , Rfl:    hydroxychloroquine (PLAQUENIL) 200 MG tablet, Take 1 tablet (200 mg total) by mouth 2 (two) times daily., Disp: 30 tablet, Rfl: 0   ipratropium-albuterol (DUONEB) 0.5-2.5 (3) MG/3ML SOLN, 3 mLs as needed. , Disp: , Rfl:    lactulose, encephalopathy, (CHRONULAC) 10 GM/15ML SOLN, daily as needed. , Disp: , Rfl:    montelukast (SINGULAIR) 10 MG tablet, Take 10 mg by mouth at bedtime. , Disp: , Rfl:    oxyCODONE (ROXICODONE) 15 MG immediate release tablet, TAKE ONE TABLET BY MOUTH EVERY EIGHT HOURS AS NEEDED, Disp: 90 tablet, Rfl: 0   phentermine 37.5 MG capsule, Take 1 capsule by mouth once daily in the morning, Disp: 30 capsule, Rfl: 5   promethazine (PHENERGAN) 50 MG/ML injection, Inject into the muscle as needed. , Disp: , Rfl:    topiramate (TOPAMAX) 200 MG tablet, Take 1 tablet (200 mg total) by mouth 2 (two) times daily., Disp: 180 tablet, Rfl: 3   traZODone (DESYREL) 100 MG tablet, Take 2 tablets (200 mg total) by mouth at bedtime., Disp: 180 tablet, Rfl: 1   triamcinolone (NASACORT) 55 MCG/ACT AERO nasal inhaler, Place 2 sprays into the nose., Disp: , Rfl:    trimethoprim (TRIMPEX) 100 MG tablet, Take 1 tablet by mouth  daily, Disp: , Rfl:    Ubrogepant (UBRELVY) 100 MG TABS, Take 100 mg by mouth daily as needed. Take one tablet at onset of headache, may repeat 1 tablet in 2 hours, no more than 2 tablets in 24 hours, Disp: 8 tablet, Rfl: 11    Vitamin D, Ergocalciferol, (DRISDOL) 50000 UNITS CAPS capsule, Take 50,000 Units by mouth every 7 (seven) days. , Disp: , Rfl:    phentermine 37.5 MG capsule, Take 1 capsule by mouth once daily in the morning, Disp: 30 capsule, Rfl: 5  Current Facility-Administered Medications:    botulinum toxin Type A (BOTOX) injection 155 Units, 155 Units, Intramuscular, Once, Chania Kochanski, Nanine Means, MD  PAST MEDICAL HISTORY: Past Medical History:  Diagnosis Date   Asthma    Carpal tunnel syndrome 09/29/2014   Chronic back pain    Chronic  kidney disease    Diabetes mellitus without complication (HCC)    Fatty infiltration of liver 03/17/2015   Fibromyalgia    Hypertension    IBS (irritable bowel syndrome)    Interstitial cystitis    Lupus (HCC)    Lupus (systemic lupus erythematosus) ([place])    Migraines    Pseudotumor cerebri     PAST SURGICAL HISTORY: Past Surgical History:  Procedure Laterality Date   ABDOMINAL HYSTERECTOMY     APPENDECTOMY     CHOLECYSTECTOMY     CYSTOSCOPY      FAMILY HISTORY: Family History  Problem Relation Age of Onset   Graves' disease Mother    Heart disease Father    Prostate cancer Father    Healthy Brother    Healthy Maternal Aunt    Healthy Maternal Uncle     SOCIAL HISTORY:  Social History   Socioeconomic History   Marital status: Married    Spouse name: Not on file   Number of children: Not on file   Years of education: Not on file   Highest education level: Not on file  Occupational History   Not on file  Tobacco Use   Smoking status: Never   Smokeless tobacco: Never  Vaping Use   Vaping Use: Never used  Substance and Sexual Activity   Alcohol use: No   Drug use: No   Sexual activity: Not on file  Other Topics Concern   Not on file  Social History Narrative   Not on file   Social Determinants of Health   Financial Resource Strain: Not on file  Food Insecurity: Not on file  Transportation Needs: Not on file  Physical Activity: Not  on file  Stress: Not on file  Social Connections: Not on file  Intimate Partner Violence: Not on file     PHYSICAL EXAM   General: The patient is in no distress.     Neck: She has mild tenderness over the splenius capitis muscles, right greater than left.  Mild tenderness over some of the fibromyalgia tender points.  Neurologic Exam  Mental status: The patient is alert and oriented x 3 at the time of the examination. The patient has apparent normal recent and remote memory, with an apparently normal attention span and concentration ability.   Speech is normal.  Cranial nerves: Extraocular movements are full.     Facial strength is normal.  Trapezius and sternocleidomastoid strength is normal.  Motor:  Muscle bulk is normal.   Tone is normal. Strength is  5 / 5 in limbs   Gait and station: Station is normal.   Gait is normal    DIAGNOSTIC DATA (LABS, IMAGING, TESTING) - I reviewed patient records, labs, notes, testing and imaging myself where available.  Lab Results  Component Value Date   WBC 7.5 05/24/2021   HGB 14.0 05/24/2021   HCT 42.9 05/24/2021   MCV 89.9 05/24/2021   PLT 214 05/24/2021        ASSESSMENT AND PLAN    1. Chronic migraine w/o aura w/o status migrainosus, not intractable   2. Fibromyalgia   3. Neck pain   4. BMI 36.0-36.9,adult   5. Insomnia, unspecified type      1.   Inject Botox as follows:: 115 units into the muscles of the face and head as follows: Frontalis (5 units x 2 on the left, 5 units x 2 on the right), upper frontalis (5 units x 2 on the left, 5 units x  2 on the right) procerus and corrugators (5 units x 3), temporalis (5 units x 4 on the left and 5 units x 4 on the right), occipitalis (5 units x 2 on the left and 5 units x 2 on the right) 85 Units into the muscles of the neck as follows: Splenius capitis (12.5 units left (15 units right), Splenius  cervicus (7.5 units left and 10 units right), trapezius (10 units x 2); rhomboid  10 U x 2  2.  She will continue oxycodone and Cymbalta for FMS pain. She has been compiant and she has not been getting medications from other practices.  3.   Stay active and exercise as tolerated. 4.   Restart phentermine.   Return in 3 months or sooner if there are new or worsening neurologic symptoms.  Idali Lafever A. Felecia Shelling, MD, PhD 1/61/0960, 4:54 PM Certified in Neurology, Clinical Neurophysiology, Sleep Medicine, Pain Medicine and Neuroimaging  [facility] [address], [place], [place] [address] [phone]

## 2022-06-18 NOTE — Telephone Encounter (Signed)
Per benefits BV- No PA needed and this will be [employer] or can use [employer].

## 2022-07-06 ENCOUNTER — Other Ambulatory Visit: Payer: Self-pay | Admitting: Neurology

## 2022-07-15 ENCOUNTER — Other Ambulatory Visit: Payer: Self-pay | Admitting: Neurology

## 2022-07-15 MED ORDER — OXYCODONE HCL 15 MG PO TABS: 15.0000 mg | ORAL_TABLET | Freq: Three times a day (TID) | ORAL | 0 refills | Status: DC | PRN

## 2022-07-15 NOTE — Telephone Encounter (Signed)
Pt has follow up scheduled on 11/27/22 see Terrence Dupont note about when Rx last filled. Rx pending to be signed.

## 2022-07-15 NOTE — Telephone Encounter (Signed)
Pt is calling stated she needs a refill on oxyCODONE (ROXICODONE) 15 MG immediate release tablet. Stated she needs a hard copy because [place] e-script is down. PT is requesting a call once it's ready for pick-up.

## 2022-07-15 NOTE — Telephone Encounter (Signed)
Please call pt back. We are no longer able to provide hard copies of controlled medications per [place] law. Is there an alternate pharmacy we can send it to in the meantime? Please also schedule f/u. She is due around 09/17/22.  Per drug registry, last refilled 06/13/22 #90. Last seen 06/18/22. Has no f/u.

## 2022-08-12 ENCOUNTER — Other Ambulatory Visit: Payer: Self-pay | Admitting: Neurology

## 2022-08-13 NOTE — Telephone Encounter (Signed)
Last seen on 06/18/22 for Botox injection Last in office visit with on 10/17/22 with Amy Lomax,NP Follow up visit scheduled on 11/27/22 with you Rx last filled on 07/15/22 # 90 (30 day supply) Rx pending to be signed

## 2022-08-29 ENCOUNTER — Other Ambulatory Visit (HOSPITAL_COMMUNITY): Payer: Self-pay

## 2022-09-10 ENCOUNTER — Other Ambulatory Visit: Payer: Self-pay | Admitting: Neurology

## 2022-09-10 NOTE — Telephone Encounter (Signed)
Last seen on 06/18/22 for botox injection per note "She will continue oxycodone and Cymbalta for FMS pain. "  Follow up scheduled on 11/27/22  Last filled on 08/13/22 #90 tablets (30 day supply)  Rx pending to be signed

## 2022-09-25 ENCOUNTER — Other Ambulatory Visit (HOSPITAL_COMMUNITY): Payer: Self-pay

## 2022-09-26 ENCOUNTER — Other Ambulatory Visit: Payer: Self-pay

## 2022-09-30 NOTE — Telephone Encounter (Signed)
We received (1) 200 unit vial from [facility]. Pt does not have upcoming Botox appt, per previous notes she has an unpaid balance with our office.

## 2022-10-10 ENCOUNTER — Encounter: Payer: Self-pay | Admitting: Neurology

## 2022-10-10 ENCOUNTER — Ambulatory Visit: Payer: Medicare Other | Admitting: Neurology

## 2022-10-10 VITALS — BP 129/87 | HR 74 | Ht 65.0 in | Wt 213.5 lb

## 2022-10-10 DIAGNOSIS — F32A Depression, unspecified: Secondary | ICD-10-CM | POA: Diagnosis not present

## 2022-10-10 DIAGNOSIS — M797 Fibromyalgia: Secondary | ICD-10-CM

## 2022-10-10 DIAGNOSIS — M542 Cervicalgia: Secondary | ICD-10-CM

## 2022-10-10 DIAGNOSIS — Z6836 Body mass index (BMI) 36.0-36.9, adult: Secondary | ICD-10-CM

## 2022-10-10 DIAGNOSIS — G43709 Chronic migraine without aura, not intractable, without status migrainosus: Secondary | ICD-10-CM | POA: Diagnosis not present

## 2022-10-10 DIAGNOSIS — R5383 Other fatigue: Secondary | ICD-10-CM

## 2022-10-10 MED ORDER — OXYCODONE HCL 15 MG PO TABS: 15.0000 mg | ORAL_TABLET | Freq: Three times a day (TID) | ORAL | 0 refills | Status: DC | PRN

## 2022-10-10 MED ORDER — ONABOTULINUMTOXINA 200 UNITS IJ SOLR: INTRAMUSCULAR | 2 refills | Status: DC

## 2022-10-10 MED ORDER — ONABOTULINUMTOXINA 200 UNITS IJ SOLR: 155.0000 [IU] | Freq: Once | INTRAMUSCULAR | Status: DC

## 2022-10-10 MED ORDER — ONABOTULINUMTOXINA 200 UNITS IJ SOLR
200.0000 [IU] | Freq: Once | INTRAMUSCULAR | Status: DC
Start: 2022-10-10 — End: 2023-07-16

## 2022-10-10 NOTE — Progress Notes (Signed)
[facility]  PATIENT: Connie Arnold DOB: [birth year]    HISTORICAL  CHIEF COMPLAINT:  Chief Complaint  Patient presents with   Follow-up    Patient is in room 11 here for botox injection.    HISTORY OF PRESENT ILLNESS:  Connie Arnold is a 57 y.o. woman with chronic migraine headaches and other neurologic issues.    Update   09/19/2022 She had a bad HA earlier this week in temples - it improved after 2 days.  Otherwise, headaches have done fairly well since the last Botox injection.  She she has generally done very well with Botox.  Before Botox she was having daily headache.  The last series of injections was 06/18/2022:   She has tolerated the injections well.  Specifically, no eyelid drooping or neck weakness.  She is also on topiramate to help with her chronic migraines.  HA's are pounding and associated with photophobia, phonophobia and nausea.    She also has fibromyalgia and her pain worsens if she is less active..   She is on a combination of oxycodone, duloxetine that has helped the fibromyalgia pain..     Talk screens have not showed illicit substances.  She has not escalated and is not getting opiates from other doctors.  The PDMP Database was reviewed today.   She has sleep maintenance greater and sleep onset insomnia helped by trazodone 100 mg po qHS.   Marland Kitchen  She is on Benlysta for SLE (sees Dr. Erroll Luna).    She has some depression.  She is planning on restarting counseling.   Phentermine has helped weight loss.   ------------ Chronic Migraine History:   She has had chronic migraine headache with moderate pain, flaring up to severe,  for most of the past couple of years   She is having chronic migraine 30/30 days every month, lasting for more than 4 hours every day.  She has received some benefit from occipital nerve blocks, trigger point injections and medications but the headaches still persist on a daily basis for past couple years.    She had been on  Botox for migraine prophylaxis in the past with benefit. However, with changes in insurance she was unable to continue due to very high co-pay.   She is currently on Topamax 200 mg by mouth twice a day and muscle relaxants and opiates.  She has occasional emergency room visits and frequent additional visits to my office for trigger point injections..  She has tried and failed multiple prophylactic medications for her chronic migraine: Antiepileptics:    zonisamide, topiramate, Keppra.   (Due to obesity, Depakote is a poor choice) Anti-depressants  amitriptyline, imipramine Anti-hypertensives:  Diamox Muscle relaxants:  baclofen, tizanidine NSAID's:  She is unable to tolerate chronic anti-inflammatories due to mild renal insufficiency.  She has had multiple visits to ER and to our office for injections when pain is more severe.     SLE:   She see Dr. Nydia Bouton at [facility].   She was diagnosed with SLE in 2006 and has been on Plaquenil x years and more recently Benlysta.    Her Complements were normal 11/17 but ANA is positive (speckled)   ALLERGIES: Allergies  Allergen Reactions   Nsaids Other (See Comments)    Chronic kidney failure   Codeine    Ramipril    Imitrex [Sumatriptan] Nausea And Vomiting    HOME MEDICATIONS:  Current Outpatient Medications:    albuterol (PROVENTIL HFA;VENTOLIN HFA) 108 (90 Base) MCG/ACT inhaler,  Inhale into the lungs., Disp: , Rfl:    albuterol (PROVENTIL) (2.5 MG/3ML) 0.083% nebulizer solution, , Disp: , Rfl:    Azelastine-Fluticasone 137-50 MCG/ACT SUSP, Place 1 spray into the nose as needed. , Disp: , Rfl:    baclofen (LIORESAL) 10 MG tablet, TAKE 1 TO 2 TABLETS BY  MOUTH 3 TIMES DAILY, Disp: 540 tablet, Rfl: 3   Belimumab 200 MG/ML SOAJ, Inject 1 Dose into the skin once a week. , Disp: , Rfl:    botulinum toxin Type A (BOTOX) 200 units injection, Inject 155units to head and neck intramuscularly every 3 months. To be administered by provider in office. Discard any  unused portion., Disp: 1 each, Rfl: 2   cetirizine (ZYRTEC) 10 MG tablet, Take 10 mg by mouth daily. , Disp: , Rfl:    DULoxetine (CYMBALTA) 60 MG capsule, Take 1 capsule (60 mg total) by mouth daily., Disp: 90 capsule, Rfl: 4   fluconazole (DIFLUCAN) 150 MG tablet, Take 150 mg by mouth once. , Disp: , Rfl:    FLUoxetine (PROZAC) 40 MG capsule, Take 40 mg by mouth daily. , Disp: , Rfl:    furosemide (LASIX) 40 MG tablet, Take 40 mg by mouth daily. , Disp: , Rfl:    glucose blood test strip, , Disp: , Rfl:    hydroxychloroquine (PLAQUENIL) 200 MG tablet, Take 1 tablet (200 mg total) by mouth 2 (two) times daily., Disp: 30 tablet, Rfl: 0   ipratropium-albuterol (DUONEB) 0.5-2.5 (3) MG/3ML SOLN, 3 mLs as needed. , Disp: , Rfl:    lactulose, encephalopathy, (CHRONULAC) 10 GM/15ML SOLN, daily as needed. , Disp: , Rfl:    montelukast (SINGULAIR) 10 MG tablet, Take 10 mg by mouth at bedtime. , Disp: , Rfl:    oxyCODONE (ROXICODONE) 15 MG immediate release tablet, TAKE ONE TABLET BY MOUTH EVERY EIGHT HOURS AS NEEDED, Disp: 90 tablet, Rfl: 0   phentermine 37.5 MG capsule, Take 1 capsule by mouth once daily in the morning, Disp: 30 capsule, Rfl: 5   promethazine (PHENERGAN) 50 MG/ML injection, Inject into the muscle as needed. , Disp: , Rfl:    topiramate (TOPAMAX) 200 MG tablet, Take 1 tablet (200 mg total) by mouth 2 (two) times daily., Disp: 180 tablet, Rfl: 3   traZODone (DESYREL) 100 MG tablet, Take 2 tablets (200 mg total) by mouth at bedtime., Disp: 180 tablet, Rfl: 1   triamcinolone (NASACORT) 55 MCG/ACT AERO nasal inhaler, Place 2 sprays into the nose., Disp: , Rfl:    trimethoprim (TRIMPEX) 100 MG tablet, Take 1 tablet by mouth  daily, Disp: , Rfl:    Ubrogepant (UBRELVY) 100 MG TABS, Take 100 mg by mouth daily as needed. Take one tablet at onset of headache, may repeat 1 tablet in 2 hours, no more than 2 tablets in 24 hours, Disp: 8 tablet, Rfl: 11   Vitamin D, Ergocalciferol, (DRISDOL) 50000  UNITS CAPS capsule, Take 50,000 Units by mouth every 7 (seven) days. , Disp: , Rfl:    phentermine 37.5 MG capsule, Take 1 capsule by mouth once daily in the morning, Disp: 30 capsule, Rfl: 5  Current Facility-Administered Medications:    botulinum toxin Type A (BOTOX) injection 155 Units, 155 Units, Intramuscular, Once, Connie Arnold, Pearletha Furl, MD  PAST MEDICAL HISTORY: Past Medical History:  Diagnosis Date   Asthma    Carpal tunnel syndrome 09/29/2014   Chronic back pain    Chronic kidney disease    Diabetes mellitus without complication (HCC)  Fatty infiltration of liver 03/17/2015   Fibromyalgia    Hypertension    IBS (irritable bowel syndrome)    Interstitial cystitis    Lupus (HCC)    Lupus (systemic lupus erythematosus) (HCC)    Migraines    Pseudotumor cerebri     PAST SURGICAL HISTORY: Past Surgical History:  Procedure Laterality Date   ABDOMINAL HYSTERECTOMY     APPENDECTOMY     CHOLECYSTECTOMY     CYSTOSCOPY      FAMILY HISTORY: Family History  Problem Relation Age of Onset   Graves' disease Mother    Heart disease Father    Prostate cancer Father    Healthy Brother    Healthy Maternal Aunt    Healthy Maternal Uncle     SOCIAL HISTORY:  Social History   Socioeconomic History   Marital status: Married    Spouse name: Not on file   Number of children: Not on file   Years of education: Not on file   Highest education level: Not on file  Occupational History   Not on file  Tobacco Use   Smoking status: Never   Smokeless tobacco: Never  Vaping Use   Vaping Use: Never used  Substance and Sexual Activity   Alcohol use: No   Drug use: No   Sexual activity: Not on file  Other Topics Concern   Not on file  Social History Narrative   Not on file   Social Determinants of Health   Financial Resource Strain: Not on file  Food Insecurity: Not on file  Transportation Needs: Not on file  Physical Activity: Not on file  Stress: Not on file  Social  Connections: Not on file  Intimate Partner Violence: Not on file     PHYSICAL EXAM   General: The patient is in no distress.     Neck: She has mild tenderness over the splenius capitis muscles, right greater than left.  Mild tenderness over some of the fibromyalgia tender points.  Neurologic Exam  Mental status: The patient is alert and oriented x 3 at the time of the examination. The patient has apparent normal recent and remote memory, with an apparently normal attention span and concentration ability.   Speech is normal.  Cranial nerves: Extraocular movements are full.     Facial strength is normal.  Trapezius and sternocleidomastoid strength is normal.  Motor:  Muscle bulk is normal.   Tone is normal. Strength is  5 / 5 in limbs   Gait and station: Station is normal.   Gait is normal    DIAGNOSTIC DATA (LABS, IMAGING, TESTING) - I reviewed patient records, labs, notes, testing and imaging myself where available.  Lab Results  Component Value Date   WBC 7.5 05/24/2021   HGB 14.0 05/24/2021   HCT 42.9 05/24/2021   MCV 89.9 05/24/2021   PLT 214 05/24/2021        ASSESSMENT AND PLAN    1. Chronic migraine w/o aura w/o status migrainosus, not intractable   2. Fibromyalgia   3. Neck pain   4. BMI 36.0-36.9,adult   5. Insomnia, unspecified type      1.   Botox injection as follows:: 115 units into the muscles of the face and head as follows: Frontalis (5 units x 2 on the left, 5 units x 2 on the right), upper frontalis (5 units x 2 on the left, 5 units x 2 on the right) procerus and corrugators (5 units x 3), temporalis (5  units x 4 on the left and 5 units x 4 on the right), occipitalis (5 units x 2 on the left and 5 units x 2 on the right) 85 Units into the muscles of the neck as follows: Splenius capitis (12.5 units left (15 units right), Splenius  cervicus (7.5 units left and 10 units right), trapezius (10 units x 2); rhomboid 10 U x 2  2.  Fibromyalgia is doing  better than in past.  She will continue oxycodone and Cymbalta for FMS pain. She has been compiant and she has not been getting medications from other practices.  3.   Stay active and exercise as tolerated.  Phentermine to help with weight loss 4.   return in 3 months or sooner if there are new or worsening neurologic symptoms.  Ysabel Cowgill A. Epimenio Foot, MD, PhD 06/18/2022, 4:52 PM Certified in Neurology, Clinical Neurophysiology, Sleep Medicine, Pain Medicine and Neuroimaging  [facility] [address], [address] [place], [place] [address] [phone]

## 2022-10-10 NOTE — Progress Notes (Signed)
Botox- 200 units x 1 vial WGN:[id number] Expiration: 10/2024 NDC: 6578-4696-29  Bacteriostatic 0.9% Sodium Chloride- * mL  BMW:[id number] Expiration: 08/19/2023 NDC:0409-1966-02  Dx:G43.709 S/P Witnessed by: Dr.Sater drew up botox and saline for patient.

## 2022-11-08 ENCOUNTER — Other Ambulatory Visit: Payer: Self-pay | Admitting: Neurology

## 2022-11-11 ENCOUNTER — Other Ambulatory Visit: Payer: Self-pay | Admitting: Neurology

## 2022-11-11 NOTE — Telephone Encounter (Signed)
Rx sent 

## 2022-11-27 ENCOUNTER — Ambulatory Visit: Payer: Medicare Other | Admitting: Neurology

## 2022-12-05 ENCOUNTER — Other Ambulatory Visit: Payer: Self-pay | Admitting: Neurology

## 2022-12-05 MED ORDER — OXYCODONE HCL 15 MG PO TABS: 15.0000 mg | ORAL_TABLET | Freq: Three times a day (TID) | ORAL | 0 refills | Status: DC | PRN

## 2022-12-05 NOTE — Telephone Encounter (Signed)
Pt stated she is going on vacation on 5/21 and she needs refill on oxyCODONE (ROXICODONE) 15 MG immediate release tablet  early. Refill should be sent to [facility]

## 2022-12-05 NOTE — Telephone Encounter (Signed)
Pt last seen on 10/10/22 No follow up scheduled 10/10/22 #90 tablets (30 day supply Rx pending to be signed

## 2022-12-23 ENCOUNTER — Other Ambulatory Visit (HOSPITAL_COMMUNITY): Payer: Self-pay

## 2022-12-27 ENCOUNTER — Other Ambulatory Visit (HOSPITAL_COMMUNITY): Payer: Self-pay

## 2022-12-30 ENCOUNTER — Encounter (HOSPITAL_COMMUNITY): Payer: Self-pay

## 2022-12-30 ENCOUNTER — Other Ambulatory Visit (HOSPITAL_COMMUNITY): Payer: Self-pay

## 2022-12-31 ENCOUNTER — Other Ambulatory Visit: Payer: Self-pay

## 2023-01-02 ENCOUNTER — Other Ambulatory Visit: Payer: Self-pay | Admitting: Neurology

## 2023-01-02 NOTE — Telephone Encounter (Signed)
Dispensed Days Supply Quantity Provider Pharmacy  PHENTERMINE 37.5MG  CAPSULES 11/11/2022 30 30 each Sater, Pearletha Furl, MD [facility] #...  PHENTERMINE 37.5MG  CAPSULES 10/10/2022 30 30 each Sater, Pearletha Furl, MD [facility]ORE #...  PHENTERMINE 37.5MG  CAPSULES 08/24/2022 30 30 each Sater, Pearletha Furl, MD [facility] #...  PHENTERMINE 37.5MG  CAPSULES 07/24/2022 30 30 each Sater, Pearletha Furl, MD [facility]ORE #...  PHENTERMINE 37.5MG  CAPSULES 06/20/2022 30 30 each Sater, Pearletha Furl, MD [facility] #...    Last visit 10/10/22 Next visit not schedules

## 2023-01-06 ENCOUNTER — Other Ambulatory Visit: Payer: Self-pay

## 2023-01-06 ENCOUNTER — Other Ambulatory Visit: Payer: Self-pay | Admitting: Neurology

## 2023-01-06 MED ORDER — OXYCODONE HCL 15 MG PO TABS: 15.0000 mg | ORAL_TABLET | Freq: Three times a day (TID) | ORAL | 0 refills | Status: DC | PRN

## 2023-01-06 NOTE — Progress Notes (Signed)
Last office visit: 10/10/2022 Next office visit: N/A

## 2023-01-07 NOTE — Telephone Encounter (Signed)
Last seen on 10/10/22 No follow up scheduled  Last filled on 12/07/22 #90 tablets (30 day supply) Rx pending to be signed

## 2023-02-06 ENCOUNTER — Other Ambulatory Visit (HOSPITAL_COMMUNITY): Payer: Self-pay

## 2023-02-07 ENCOUNTER — Other Ambulatory Visit: Payer: Self-pay

## 2023-02-13 ENCOUNTER — Other Ambulatory Visit: Payer: Self-pay | Admitting: Neurology

## 2023-02-13 NOTE — Telephone Encounter (Signed)
Last seen on 10/10/22 No follow up scheduled

## 2023-03-06 ENCOUNTER — Other Ambulatory Visit: Payer: Self-pay | Admitting: Neurology

## 2023-03-06 NOTE — Telephone Encounter (Addendum)
Last seen 10/10/22 No follow up scheduled

## 2023-04-04 ENCOUNTER — Other Ambulatory Visit: Payer: Self-pay | Admitting: Neurology

## 2023-04-07 NOTE — Telephone Encounter (Signed)
Dr.Yan you are work in provider this PM Last seen on 10/10/22 No 3 month follow up scheduled  Last filled on 03/07/23 #90 tablets (30 day supply Rx pending to be signed

## 2023-05-05 ENCOUNTER — Other Ambulatory Visit: Payer: Self-pay | Admitting: Neurology

## 2023-05-06 NOTE — Telephone Encounter (Signed)
Last seen on 10/10/22 No follow up scheduled

## 2023-05-06 NOTE — Telephone Encounter (Signed)
Requested Prescriptions   Pending Prescriptions Disp Refills   oxyCODONE (ROXICODONE) 15 MG immediate release tablet [Pharmacy Med Name: OXYCODONE 15 MG TAB] 90 tablet     Sig: TAKE ONE TABLET BY MOUTH EVERY EIGHT HOURS AS NEEDED   Last seen 10/10/22 Next appt not scheduled Dispenses    Dispensed Days Supply Quantity Provider Pharmacy  OXYCODONE HCL 15 MG TABS 04/07/2023 30 90 tablet Levert Feinstein, MD [facility]  OXYCODONE HCL 15 MG TABS 03/07/2023 30 90 tablet Sater, Pearletha Furl, MD [facility]...  OXYCODONE HCL 15 MG TABS 02/06/2023 30 90 tablet Sater, Pearletha Furl, MD [facility]  OXYCODONE HCL 15 MG TABS 01/06/2023 30 90 tablet Sater, Pearletha Furl, MD [facility]...  OXYCODONE HCL 15 MG TABS 12/07/2022 30 90 tablet Sater, Pearletha Furl, MD [facility]...  OXYCODONE HCL 15 MG TABS 11/11/2022 30 90 tablet Sater, Pearletha Furl, MD [facility]...  OXYCODONE 15MG  IMMEDIATE REL TABS 10/10/2022 30 90 each Sater, Pearletha Furl, MD [facility]ORE #...  OXYCODONE HCL 15 MG TABS 09/10/2022 30 90 tablet Sater, Pearletha Furl, MD [facility]...  OXYCODONE HCL 15 MG TAB 08/13/2022 30 90 each Sater, Pearletha Furl, MD [facility]...  OXYCODONE 15MG  IMMEDIATE REL TABS 07/15/2022 30 90 each Sater, Pearletha Furl, MD [facility]ORE #...  OXYCODONE HCL 15 MG TAB 06/13/2022 30 90 each Sater, Pearletha Furl, MD [facility].Marland KitchenMarland Kitchen

## 2023-06-02 ENCOUNTER — Telehealth: Payer: Self-pay | Admitting: Neurology

## 2023-06-02 NOTE — Telephone Encounter (Signed)
 Pt states she took care of her past debt and needs to schedule a f/u appt. Scheduled and wait listed

## 2023-06-04 ENCOUNTER — Other Ambulatory Visit: Payer: Self-pay | Admitting: Neurology

## 2023-06-05 NOTE — Telephone Encounter (Signed)
Dispenses    Dispensed Days Supply Quantity Provider Pharmacy  OXYCODONE HCL 15 MG TABS 05/06/2023 30 90 tablet Sater, Pearletha Furl, MD [facility]  OXYCODONE HCL 15 MG TABS 04/07/2023 30 90 tablet Levert Feinstein, MD [facility]...  OXYCODONE HCL 15 MG TABS 03/07/2023 30 90 tablet Sater, Pearletha Furl, MD [facility]...  OXYCODONE HCL 15 MG TABS 02/06/2023 30 90 tablet Sater, Pearletha Furl, MD [facility]...  OXYCODONE HCL 15 MG TABS 01/06/2023 30 90 tablet Sater, Pearletha Furl, MD [facility]...  OXYCODONE HCL 15 MG TABS 12/07/2022 30 90 tablet Sater, Pearletha Furl, MD [facility]...  OXYCODONE HCL 15 MG TABS 11/11/2022 30 90 tablet Sater, Pearletha Furl, MD [facility]...  OXYCODONE 15MG  IMMEDIATE REL TABS 10/10/2022 30 90 each Sater, Pearletha Furl, MD [facility] STORE #...  OXYCODONE HCL 15 MG TABS 09/10/2022 30 90 tablet Sater, Pearletha Furl, MD [facility]...  OXYCODONE HCL 15 MG TAB 08/13/2022 30 90 each Sater, Pearletha Furl, MD [facility]...  OXYCODONE 15MG  IMMEDIATE REL TABS 07/15/2022 30 90 each Sater, Pearletha Furl, MD [facility]ORE #...  OXYCODONE HCL 15 MG TAB 06/13/2022 30 90 each Sater, Pearletha Furl, MD [facility]      Last seen 10/10/22, next appt 11/12/23

## 2023-06-05 NOTE — Telephone Encounter (Signed)
Pt called wanting to know when this will be called in for her. She states that the office will be closed tomorrow and she is wanting to get it before tomorrow.

## 2023-06-16 ENCOUNTER — Other Ambulatory Visit (HOSPITAL_COMMUNITY): Payer: Self-pay

## 2023-07-03 ENCOUNTER — Other Ambulatory Visit: Payer: Self-pay | Admitting: Neurology

## 2023-07-04 ENCOUNTER — Other Ambulatory Visit: Payer: Self-pay | Admitting: Neurology

## 2023-07-04 MED ORDER — OXYCODONE HCL 15 MG PO TABS: 15.0000 mg | ORAL_TABLET | Freq: Three times a day (TID) | ORAL | 0 refills | Status: DC | PRN

## 2023-07-07 ENCOUNTER — Telehealth: Payer: Self-pay | Admitting: Neurology

## 2023-07-07 MED ORDER — OXYCODONE HCL 15 MG PO TABS: 15.0000 mg | ORAL_TABLET | Freq: Three times a day (TID) | ORAL | 0 refills | Status: DC | PRN

## 2023-07-07 NOTE — Telephone Encounter (Signed)
Called [facility] at [phone]. LVM asking they cx rx oxycodone sent in on 07/04/23.   Resent to MD to e-scribe to [facility] instead.

## 2023-07-07 NOTE — Telephone Encounter (Signed)
Pt states somehow her  oxyCODONE (ROXICODONE) 15 MG immediate release tablet  was called into [facility] #[id number].  Pt only wanted this called into [facility].  Pt is asking for a new Rx to be sent there, [facility] can not transfer .

## 2023-07-14 ENCOUNTER — Telehealth: Payer: Self-pay | Admitting: Neurology

## 2023-07-14 NOTE — Telephone Encounter (Signed)
 Pt said, have had a migraine for 2 weeks, radiating to my back. Have taking Tylenol and oxyCODONE (ROXICODONE) 15 MG immediate release tablet and sleeping. Have had no relief, would like a call back.

## 2023-07-14 NOTE — Telephone Encounter (Signed)
 [name] is reaching out to pt to discuss. She is able to schedule OV, but has a separate balance for previous Botox.

## 2023-07-14 NOTE — Telephone Encounter (Signed)
 Per [name] in billing, she made payment 05/2023 and ok to keep appt.

## 2023-07-14 NOTE — Telephone Encounter (Signed)
 Pt last seen 10/10/22 and next f/u 11/12/23. Called pt. Saw PCP last week. Told her to f/u with neurologist.   Last Botox 10/10/22. She would like to get botox scheduled. I made appt for 07/16/23 at 8:30am. She is aware we may not be able to get approval/med delivered in time for this appt. I will send urgent message to [facility] to see if this is possible.

## 2023-07-15 NOTE — Telephone Encounter (Signed)
 Called pt. LVM. If pt calls, just wanting to confirm she still wants office visit tomorrow morning with Dr. Epimenio Foot.

## 2023-07-16 ENCOUNTER — Encounter: Payer: Self-pay | Admitting: Neurology

## 2023-07-16 ENCOUNTER — Ambulatory Visit: Payer: Medicare Other | Admitting: Neurology

## 2023-07-16 VITALS — BP 142/92 | HR 78 | Ht 65.0 in | Wt 213.4 lb

## 2023-07-16 DIAGNOSIS — M797 Fibromyalgia: Secondary | ICD-10-CM

## 2023-07-16 DIAGNOSIS — G44209 Tension-type headache, unspecified, not intractable: Secondary | ICD-10-CM

## 2023-07-16 DIAGNOSIS — R519 Headache, unspecified: Secondary | ICD-10-CM

## 2023-07-16 DIAGNOSIS — G43709 Chronic migraine without aura, not intractable, without status migrainosus: Secondary | ICD-10-CM

## 2023-07-16 DIAGNOSIS — F32A Depression, unspecified: Secondary | ICD-10-CM

## 2023-07-16 DIAGNOSIS — M542 Cervicalgia: Secondary | ICD-10-CM

## 2023-07-16 MED ORDER — TRAZODONE HCL 100 MG PO TABS: 200.0000 mg | ORAL_TABLET | Freq: Every day | ORAL | 2 refills | Status: AC
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
## 2023-08-01 ENCOUNTER — Other Ambulatory Visit: Payer: Self-pay | Admitting: Neurology

## 2023-08-01 NOTE — Telephone Encounter (Signed)
 Last seen 07/16/23 and next f/u 11/12/23. Last refilled 07/07/23 #90.

## 2023-08-04 ENCOUNTER — Other Ambulatory Visit: Payer: Self-pay | Admitting: Neurology

## 2023-08-04 NOTE — Telephone Encounter (Signed)
 Last seen 07/16/23, next appt 11/12/23 Dispenses   Dispensed Days Supply Quantity Provider Pharmacy  PHENTERMINE 37.5MG  CAPSULES 07/01/2023 30 30 each Sater, Pearletha Furl, MD [facility] #...  PHENTERMINE 37.5MG  CAPSULES 05/05/2023 30 30 each Sater, Pearletha Furl, MD [facility] DR[facility] STORE #...  PHENTERMINE 37.5MG  CAPSULES 02/14/2023 30 30 each Sater, Pearletha Furl, MD [facility] DR[facility] STORE #...  PHENTERMINE 37.5MG  CAPSULES 01/07/2023 30 30 each Sater, Pearletha Furl, MD [facility] #...  PHENTERMINE 37.5MG  CAPSULES 11/11/2022 30 30 each Sater, Pearletha Furl, MD [facility] #...  PHENTERMINE 37.5MG  CAPSULES 10/10/2022 30 30 each Sater, Pearletha Furl, MD [facility] DR[facility] STORE #...  PHENTERMINE 37.5MG  CAPSULES 08/24/2022 30 30 each Sater, Pearletha Furl, MD [facility] DR[facility] STORE #.Marland KitchenMarland Kitchen

## 2023-08-28 DIAGNOSIS — Z7409 Other reduced mobility: Secondary | ICD-10-CM | POA: Insufficient documentation

## 2023-08-28 DIAGNOSIS — G8929 Other chronic pain: Secondary | ICD-10-CM | POA: Insufficient documentation

## 2023-09-04 ENCOUNTER — Other Ambulatory Visit: Payer: Self-pay | Admitting: Neurology

## 2023-09-08 NOTE — Telephone Encounter (Signed)
 Last seen on 07/16/23 Follow up scheduled on 11/12/23   Dispensed Days Supply Quantity Provider Pharmacy  OXYCODONE  HCL 15 MG TABS 08/04/2023 30 90 tablet Sater, Sherida Dimmer, MD [facility]      Rx pending to be signed

## 2023-09-17 DIAGNOSIS — F5101 Primary insomnia: Secondary | ICD-10-CM | POA: Insufficient documentation

## 2023-09-30 ENCOUNTER — Other Ambulatory Visit: Payer: Self-pay | Admitting: Neurology

## 2023-10-01 NOTE — Telephone Encounter (Addendum)
 Last seen on 07/16/23 Follow up scheduled on 11/12/23    Dispensed Days Supply Quantity Provider Pharmacy  OXYCODONE  HCL 15 MG TABS 09/08/2023 30 90 tablet Sater, Sherida Dimmer, MD [facility]      I called patient and she is not out of medication she is just wanting to make sure Rx will be ready for next refill.  Next refill not due until around 10/08/23

## 2023-11-05 ENCOUNTER — Other Ambulatory Visit: Payer: Self-pay | Admitting: Neurology

## 2023-11-05 NOTE — Telephone Encounter (Signed)
 Last seen on 07/16/23 Follow up scheduled on 11/12/23   Dispensed Days Supply Quantity Provider Pharmacy  OXYCODONE  HCL 15 MG TABS 10/08/2023 30 90 tablet Sater, Sherida Dimmer, MD [facility]      Rx pending

## 2023-11-12 ENCOUNTER — Ambulatory Visit (INDEPENDENT_AMBULATORY_CARE_PROVIDER_SITE_OTHER): Payer: Medicare Other | Admitting: Neurology

## 2023-11-12 ENCOUNTER — Encounter: Payer: Self-pay | Admitting: Neurology

## 2023-11-12 VITALS — BP 108/76 | HR 66 | Ht 65.0 in | Wt 195.0 lb

## 2023-11-12 DIAGNOSIS — G43709 Chronic migraine without aura, not intractable, without status migrainosus: Secondary | ICD-10-CM | POA: Diagnosis not present

## 2023-11-12 DIAGNOSIS — F32A Depression, unspecified: Secondary | ICD-10-CM | POA: Diagnosis not present

## 2023-11-12 DIAGNOSIS — M797 Fibromyalgia: Secondary | ICD-10-CM

## 2023-11-12 DIAGNOSIS — G44209 Tension-type headache, unspecified, not intractable: Secondary | ICD-10-CM

## 2023-11-12 DIAGNOSIS — M542 Cervicalgia: Secondary | ICD-10-CM

## 2023-11-12 MED ORDER — METHYLPREDNISOLONE ACETATE 80 MG/ML IJ SUSP
80.0000 mg | Freq: Once | INTRAMUSCULAR | Status: AC
  Administered 2023-11-12: 80 mg via INTRAMUSCULAR

## 2023-11-12 MED ORDER — TOPIRAMATE 200 MG PO TABS: 200.0000 mg | ORAL_TABLET | Freq: Two times a day (BID) | ORAL | 0 refills | Status: AC

## 2023-11-12 MED ORDER — PHENTERMINE HCL 37.5 MG PO CAPS: ORAL_CAPSULE | ORAL | 5 refills | Status: DC

## 2023-11-12 MED ORDER — BACLOFEN 10 MG PO TABS: ORAL_TABLET | ORAL | 3 refills | Status: DC
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
## 2023-12-02 ENCOUNTER — Other Ambulatory Visit: Payer: Self-pay | Admitting: Neurology

## 2023-12-03 NOTE — Telephone Encounter (Signed)
 Last seen on 11/12/23 Follow up scheduled on 03/17/24   Dispensed Days Supply Quantity Provider Pharmacy  OXYCODONE  HCL 15 MG TABS 11/06/2023 30 90 tablet Sater, Charlie LABOR, MD [facility]   Rx pending to be signed

## 2024-02-02 ENCOUNTER — Other Ambulatory Visit: Payer: Self-pay | Admitting: Neurology

## 2024-02-02 NOTE — Telephone Encounter (Signed)
 Last seen on 11/12/23 Follow up scheduled on 03/17/24   Dispensed Days Supply Quantity Provider Pharmacy  OXYCODONE  HCL 15 MG TABS 01/01/2024 30 90 tablet Sater, Charlie LABOR, MD [facility]     Rx pending to signed

## 2024-02-18 ENCOUNTER — Other Ambulatory Visit: Payer: Self-pay | Admitting: Neurology

## 2024-02-18 NOTE — Telephone Encounter (Signed)
 Last seen on 11/12/23 Follow up scheduled on 03/17/24   Dispensed Days Supply Quantity Provider Pharmacy  TOPIRAMATE  200MG  TABLETS 12/18/2023 90 180 each Sater, Charlie LABOR, MD [facility]...     Based on this last fill date pt should have enough tablets until 03/17/24  Rx denied

## 2024-03-03 ENCOUNTER — Other Ambulatory Visit: Payer: Self-pay | Admitting: Neurology

## 2024-03-03 NOTE — Telephone Encounter (Signed)
 Last seen on 11/12/23 Follow up scheduled on 03/17/24   Dispensed Days Supply Quantity Provider Pharmacy  OXYCODONE  HCL 15 MG TABS 02/02/2024 30 90 tablet Sater, Charlie LABOR, MD [facility]     Rx pending to be signed

## 2024-03-17 ENCOUNTER — Encounter: Payer: Self-pay | Admitting: Neurology

## 2024-03-17 ENCOUNTER — Ambulatory Visit (INDEPENDENT_AMBULATORY_CARE_PROVIDER_SITE_OTHER): Admitting: Neurology

## 2024-03-17 VITALS — BP 110/75 | HR 70 | Ht 65.0 in | Wt 191.0 lb

## 2024-03-17 DIAGNOSIS — G44209 Tension-type headache, unspecified, not intractable: Secondary | ICD-10-CM

## 2024-03-17 DIAGNOSIS — G43709 Chronic migraine without aura, not intractable, without status migrainosus: Secondary | ICD-10-CM | POA: Diagnosis not present

## 2024-03-17 DIAGNOSIS — F32A Depression, unspecified: Secondary | ICD-10-CM | POA: Diagnosis not present

## 2024-03-17 DIAGNOSIS — M797 Fibromyalgia: Secondary | ICD-10-CM

## 2024-03-17 DIAGNOSIS — Z79891 Long term (current) use of opiate analgesic: Secondary | ICD-10-CM

## 2024-03-17 MED ORDER — BETAMETHASONE SOD PHOS & ACET 6 (3-3) MG/ML IJ SUSP
12.0000 mg | Freq: Once | INTRAMUSCULAR | Status: AC
  Administered 2024-03-17: 12 mg via INTRAMUSCULAR
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
## 2024-03-19 ENCOUNTER — Ambulatory Visit: Payer: Self-pay | Admitting: Neurology

## 2024-03-19 ENCOUNTER — Encounter: Payer: Self-pay | Admitting: Neurology

## 2024-03-19 LAB — DRUG SCREEN 764883 11+OXYCO+ALC+CRT-BUND
Amphetamines, Urine: NEGATIVE ng/mL
BENZODIAZ UR QL: NEGATIVE ng/mL
Barbiturate screen, urine: NEGATIVE ng/mL
Cannabinoid Quant, Ur: NEGATIVE ng/mL
Cocaine (Metab.): NEGATIVE ng/mL
Creatinine, Urine: 239.9 mg/dL (ref 20.0–300.0)
Ethanol, Urine: NEGATIVE %
Meperidine: NEGATIVE ng/mL
Methadone Screen, Urine: NEGATIVE ng/mL
Nitrite Urine, Quantitative: NEGATIVE ug/mL
OPIATE SCREEN URINE: NEGATIVE ng/mL
Oxycodone/Oxymorphone, Urine: NEGATIVE ng/mL
PCP Quant, Ur: NEGATIVE ng/mL
Propoxyphene: NEGATIVE ng/mL
Tramadol: NEGATIVE ng/mL
pH, Urine: 5.5 (ref 4.5–8.9)

## 2024-04-01 ENCOUNTER — Other Ambulatory Visit: Payer: Self-pay | Admitting: Neurology

## 2024-04-05 ENCOUNTER — Other Ambulatory Visit: Payer: Self-pay | Admitting: Neurology

## 2024-04-05 MED ORDER — OXYCODONE HCL 5 MG PO TABS: 5.0000 mg | ORAL_TABLET | Freq: Three times a day (TID) | ORAL | 0 refills | Status: AC | PRN

## 2024-04-05 NOTE — Telephone Encounter (Signed)
 Last seen on 03/17/24 Follow up scheduled on 07/20/24   Dispensed Days Supply Quantity Provider Pharmacy  OXYCODONE  HCL 15 MG TABS 03/03/2024 30 90 tablet Sater, Charlie LABOR, MD [facility]     Rx pending to be signed

## 2024-04-08 NOTE — Telephone Encounter (Signed)
 Pt called asking that Dr Duncan my chart response be read to her. She said she will reach out to her PCP

## 2024-04-13 NOTE — Telephone Encounter (Signed)
 Pt called saying her rx is not at the pharmacy and she is wanting to know when it will be called in for her. Please advise.

## 2024-04-14 NOTE — Telephone Encounter (Signed)
 Pt has called to f/u on when her medication will be called in, please call

## 2024-04-22 ENCOUNTER — Encounter: Payer: Self-pay | Admitting: Neurology

## 2024-04-22 NOTE — Telephone Encounter (Signed)
 error

## 2024-04-22 NOTE — Telephone Encounter (Signed)
 Patient called to check on refill. Informed Mr. Applegate (on DPR) of last office note stated, did drug test and oxyCODONE  (OXY IR/ROXICODONE ) 5 MG immediate release tablet was not present. Will send refill for smaller dose and would not continue to write refills. But can send a referral to a pain management clinic.  Mr. Kasparian then said where did he send the refill in November because we have not picked up a refill. Informed was sent to [facility]. He stated need to be sent to [facility]. I told him can send a note to the nurse to change pharmacy and she will check with [facility]. Mr. Brubacher said will call [facility] to check for that refill and call you back.

## 2024-05-06 ENCOUNTER — Other Ambulatory Visit: Payer: Self-pay | Admitting: Neurology

## 2024-05-18 ENCOUNTER — Encounter: Payer: Self-pay | Admitting: Neurology

## 2024-05-27 ENCOUNTER — Telehealth: Payer: Self-pay | Admitting: Neurology

## 2024-05-27 ENCOUNTER — Other Ambulatory Visit: Payer: Self-pay | Admitting: Neurology

## 2024-05-27 MED ORDER — BACLOFEN 10 MG PO TABS: ORAL_TABLET | ORAL | 0 refills | Status: AC

## 2024-05-27 NOTE — Telephone Encounter (Signed)
 Pt reports she is being told by pharmacy that she is out of her baclofen  (LIORESAL ) 10 MG tablet with no refills, she is being told the medication does not even show on her med list with pharmacy. Pt is asking that a prescription be sent to pharmacy for her daily medication.  Pt confirmed there has been no changes to her insurance for [employer].  Pt also asked it be noted that her PCP suggested she has a drug retest  re: the baclofen  (LIORESAL ) 10 MG tablet , please call pt to discuss.

## 2024-05-27 NOTE — Telephone Encounter (Signed)
 Last seen on 03/17/24 Follow up scheduled on 07/20/24    Dispensed Days Supply Quantity Provider Pharmacy  PHENTERMINE  37.5MG  CAPSULES 04/16/2024 30 30 each Sater, Charlie LABOR, MD [facility]...     Rx pending to be signed

## 2024-07-20 ENCOUNTER — Ambulatory Visit: Admitting: Neurology
